# Patient Record
Sex: Male | Born: 1965
Health system: Southern US, Community
[De-identification: ages and names within clinical notes are randomized; demographics above are authoritative.]

## PROBLEM LIST (undated history)

## (undated) DIAGNOSIS — R079 Chest pain, unspecified: Secondary | ICD-10-CM

## (undated) DIAGNOSIS — J342 Deviated nasal septum: Secondary | ICD-10-CM

## (undated) DIAGNOSIS — R0789 Other chest pain: Secondary | ICD-10-CM

## (undated) DIAGNOSIS — I10 Essential (primary) hypertension: Secondary | ICD-10-CM

## (undated) DIAGNOSIS — M2629 Other anomalies of dental arch relationship: Secondary | ICD-10-CM

## (undated) DIAGNOSIS — E291 Testicular hypofunction: Secondary | ICD-10-CM

## (undated) DIAGNOSIS — N1831 Chronic kidney disease, stage 3a: Secondary | ICD-10-CM

## (undated) DIAGNOSIS — G4720 Circadian rhythm sleep disorder, unspecified type: Secondary | ICD-10-CM

## (undated) DIAGNOSIS — R1310 Dysphagia, unspecified: Secondary | ICD-10-CM

## (undated) DIAGNOSIS — N2581 Secondary hyperparathyroidism of renal origin: Secondary | ICD-10-CM

## (undated) DIAGNOSIS — N200 Calculus of kidney: Secondary | ICD-10-CM

## (undated) DIAGNOSIS — K219 Gastro-esophageal reflux disease without esophagitis: Secondary | ICD-10-CM

## (undated) DIAGNOSIS — N400 Enlarged prostate without lower urinary tract symptoms: Secondary | ICD-10-CM

## (undated) DIAGNOSIS — K2970 Gastritis, unspecified, without bleeding: Secondary | ICD-10-CM

## (undated) DIAGNOSIS — J1282 Pneumonia due to coronavirus disease 2019: Secondary | ICD-10-CM

## (undated) DIAGNOSIS — U071 COVID-19: Secondary | ICD-10-CM

## (undated) DIAGNOSIS — E785 Hyperlipidemia, unspecified: Secondary | ICD-10-CM

## (undated) DIAGNOSIS — N529 Male erectile dysfunction, unspecified: Secondary | ICD-10-CM

## (undated) DIAGNOSIS — E559 Vitamin D deficiency, unspecified: Secondary | ICD-10-CM

## (undated) DIAGNOSIS — R202 Paresthesia of skin: Secondary | ICD-10-CM

## (undated) DIAGNOSIS — R0981 Nasal congestion: Secondary | ICD-10-CM

## (undated) DIAGNOSIS — G4730 Sleep apnea, unspecified: Secondary | ICD-10-CM

## (undated) DIAGNOSIS — G4733 Obstructive sleep apnea (adult) (pediatric): Secondary | ICD-10-CM

## (undated) DIAGNOSIS — R109 Unspecified abdominal pain: Secondary | ICD-10-CM

## (undated) DIAGNOSIS — R61 Generalized hyperhidrosis: Secondary | ICD-10-CM

## (undated) HISTORY — DX: Other anomalies of dental arch relationship: M26.29

## (undated) HISTORY — PX: VASECTOMY: SHX75

## (undated) HISTORY — DX: Essential (primary) hypertension: I10

## (undated) HISTORY — DX: Gastritis, unspecified, without bleeding: K29.70

## (undated) HISTORY — DX: Dysphagia, unspecified: R13.10

## (undated) HISTORY — DX: Benign prostatic hyperplasia without lower urinary tract symptoms: N40.0

## (undated) HISTORY — DX: Generalized hyperhidrosis: R61

## (undated) HISTORY — DX: Pneumonia due to coronavirus disease 2019: J12.82

## (undated) HISTORY — DX: Unspecified abdominal pain: R10.9

## (undated) HISTORY — DX: COVID-19: U07.1

## (undated) HISTORY — PX: HERNIA REPAIR: SHX51

## (undated) HISTORY — DX: Gastro-esophageal reflux disease without esophagitis: K21.9

## (undated) HISTORY — DX: Male erectile dysfunction, unspecified: N52.9

## (undated) HISTORY — DX: Paresthesia of skin: R20.2

## (undated) HISTORY — DX: Hyperlipidemia, unspecified: E78.5

## (undated) HISTORY — DX: Deviated nasal septum: J34.2

## (undated) HISTORY — DX: Chronic kidney disease, stage 3a: N18.31

## (undated) HISTORY — DX: Circadian rhythm sleep disorder, unspecified type: G47.20

## (undated) HISTORY — DX: Calculus of kidney: N20.0

## (undated) HISTORY — DX: Sleep apnea, unspecified: G47.30

## (undated) HISTORY — DX: Nasal congestion: R09.81

## (undated) HISTORY — PX: CHOLECYSTECTOMY: SHX55

## (undated) HISTORY — DX: Chest pain, unspecified: R07.9

## (undated) HISTORY — DX: Other chest pain: R07.89

## (undated) HISTORY — DX: Obstructive sleep apnea (adult) (pediatric): G47.33

## (undated) HISTORY — DX: Secondary hyperparathyroidism of renal origin: N25.81

---
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
## 2001-02-12 ENCOUNTER — Encounter: Payer: Self-pay | Admitting: Family Medicine

## 2001-02-12 ENCOUNTER — Ambulatory Visit (HOSPITAL_COMMUNITY): Admission: RE | Admit: 2001-02-12 | Discharge: 2001-02-12 | Payer: Self-pay | Admitting: Family Medicine

## 2001-05-02 ENCOUNTER — Encounter: Payer: Self-pay | Admitting: Internal Medicine

## 2001-05-02 ENCOUNTER — Ambulatory Visit (HOSPITAL_COMMUNITY): Admission: RE | Admit: 2001-05-02 | Discharge: 2001-05-02 | Payer: Self-pay | Admitting: Internal Medicine

## 2001-08-14 ENCOUNTER — Encounter: Payer: Self-pay | Admitting: Family Medicine

## 2001-08-14 ENCOUNTER — Ambulatory Visit (HOSPITAL_COMMUNITY): Admission: RE | Admit: 2001-08-14 | Discharge: 2001-08-14 | Payer: Self-pay | Admitting: Family Medicine

## 2001-11-14 ENCOUNTER — Emergency Department (HOSPITAL_COMMUNITY): Admission: EM | Admit: 2001-11-14 | Discharge: 2001-11-14 | Payer: Self-pay

## 2002-09-03 ENCOUNTER — Encounter (HOSPITAL_COMMUNITY): Admission: RE | Admit: 2002-09-03 | Discharge: 2002-10-03 | Payer: Self-pay | Admitting: Orthopedic Surgery

## 2004-04-12 ENCOUNTER — Ambulatory Visit (HOSPITAL_COMMUNITY): Admission: RE | Admit: 2004-04-12 | Discharge: 2004-04-12 | Payer: Self-pay | Admitting: Family Medicine

## 2004-05-18 ENCOUNTER — Ambulatory Visit (HOSPITAL_COMMUNITY): Admission: RE | Admit: 2004-05-18 | Discharge: 2004-05-18 | Payer: Self-pay | Admitting: Family Medicine

## 2004-10-04 ENCOUNTER — Ambulatory Visit (HOSPITAL_COMMUNITY): Admission: RE | Admit: 2004-10-04 | Discharge: 2004-10-04 | Payer: Self-pay | Admitting: Family Medicine

## 2005-06-08 ENCOUNTER — Ambulatory Visit (HOSPITAL_COMMUNITY): Admission: RE | Admit: 2005-06-08 | Discharge: 2005-06-08 | Payer: Self-pay | Admitting: Family Medicine

## 2006-01-18 ENCOUNTER — Ambulatory Visit (HOSPITAL_COMMUNITY): Admission: RE | Admit: 2006-01-18 | Discharge: 2006-01-18 | Payer: Self-pay | Admitting: Family Medicine

## 2006-03-04 ENCOUNTER — Ambulatory Visit (HOSPITAL_COMMUNITY): Admission: RE | Admit: 2006-03-04 | Discharge: 2006-03-04 | Payer: Self-pay | Admitting: Family Medicine

## 2007-03-14 ENCOUNTER — Ambulatory Visit (HOSPITAL_COMMUNITY): Admission: RE | Admit: 2007-03-14 | Discharge: 2007-03-14 | Payer: Self-pay | Admitting: Family Medicine

## 2007-05-12 ENCOUNTER — Ambulatory Visit (HOSPITAL_COMMUNITY): Admission: RE | Admit: 2007-05-12 | Discharge: 2007-05-12 | Payer: Self-pay | Admitting: Family Medicine

## 2008-07-05 ENCOUNTER — Ambulatory Visit (HOSPITAL_COMMUNITY): Admission: RE | Admit: 2008-07-05 | Discharge: 2008-07-05 | Payer: Self-pay | Admitting: Internal Medicine

## 2009-05-10 ENCOUNTER — Emergency Department (HOSPITAL_COMMUNITY): Admission: EM | Admit: 2009-05-10 | Discharge: 2009-05-11 | Payer: Self-pay | Admitting: Emergency Medicine

## 2009-05-11 ENCOUNTER — Telehealth: Payer: Self-pay | Admitting: Gastroenterology

## 2009-05-12 ENCOUNTER — Encounter: Payer: Self-pay | Admitting: Gastroenterology

## 2009-05-30 ENCOUNTER — Ambulatory Visit: Payer: Self-pay | Admitting: Gastroenterology

## 2009-05-30 ENCOUNTER — Ambulatory Visit (HOSPITAL_COMMUNITY): Admission: RE | Admit: 2009-05-30 | Discharge: 2009-05-30 | Payer: Self-pay | Admitting: Gastroenterology

## 2009-05-30 HISTORY — PX: ESOPHAGOGASTRODUODENOSCOPY: SHX1529

## 2009-05-31 ENCOUNTER — Telehealth (INDEPENDENT_AMBULATORY_CARE_PROVIDER_SITE_OTHER): Payer: Self-pay

## 2009-05-31 ENCOUNTER — Encounter: Payer: Self-pay | Admitting: Gastroenterology

## 2009-06-10 ENCOUNTER — Ambulatory Visit (HOSPITAL_COMMUNITY): Admission: RE | Admit: 2009-06-10 | Discharge: 2009-06-10 | Payer: Self-pay | Admitting: Gastroenterology

## 2009-06-10 ENCOUNTER — Ambulatory Visit: Payer: Self-pay | Admitting: Gastroenterology

## 2009-06-10 HISTORY — PX: ESOPHAGOGASTRODUODENOSCOPY: SHX1529

## 2009-08-29 ENCOUNTER — Ambulatory Visit (HOSPITAL_COMMUNITY): Admission: RE | Admit: 2009-08-29 | Discharge: 2009-08-29 | Payer: Self-pay | Admitting: Internal Medicine

## 2009-10-07 ENCOUNTER — Ambulatory Visit: Payer: Self-pay | Admitting: Gastroenterology

## 2009-10-07 DIAGNOSIS — K219 Gastro-esophageal reflux disease without esophagitis: Secondary | ICD-10-CM | POA: Insufficient documentation

## 2010-04-19 ENCOUNTER — Ambulatory Visit: Payer: Self-pay | Admitting: Gastroenterology

## 2010-04-19 DIAGNOSIS — R1310 Dysphagia, unspecified: Secondary | ICD-10-CM | POA: Insufficient documentation

## 2010-07-24 ENCOUNTER — Telehealth (INDEPENDENT_AMBULATORY_CARE_PROVIDER_SITE_OTHER): Payer: Self-pay | Admitting: *Deleted

## 2010-08-08 NOTE — Assessment & Plan Note (Signed)
Summary: GERD, DYSPHAGIA   Visit Type:  Follow-up Visit Primary Care Provider:  Dwana Melena, M.D.   Chief Complaint:  Genella Rife.  History of Present Illness: Some foods make him bloated. has heartburn randomly. Procedure: rare problems swallowing.  Bms: 0-1/day.  Current Medications (verified): 1)  Nexium 40 Mg Cpdr (Esomeprazole Magnesium) .... As Needed 2)  Vit B Complex .... Take 1 Tablet By Mouth Once A Day 3)  Advil .... As Needed 4)  Tylenol .... As Needed 5)  Vitamin D 3 .... Take 1 Tablet By Mouth Once A Day 6)  Vitamin A .... Take 1 Tablet By Mouth Once A Day 7)  Loratadine 10 Mg Tabs (Loratadine) .... As Needed  Allergies (verified): 1)  ! Penicillin  Past History:  Past Surgical History: Last updated: 10/07/2009 Cholecystectomy Vasectomy Hernia Repair  Past Medical History: GERD Gastritis STRICTURE **EGD/DIL 17 mm-Bx: NL esophagus SHINGLES ON LEFT THIGH 2011  Family History: Reviewed history from 10/07/2009 and no changes required. No FH of Colon Cancer or polyps  Social History: Recently divorced. Wife left him 2 years ago.  Patient has never smoked.  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   Vital Signs:  Patient profile:   45 year old male Height:      73 inches Weight:      204 pounds BMI:     27.01 Temp:     97.9 degrees F oral Pulse rate:   80 / minute BP sitting:   140 / 90  (left arm) Cuff size:   regular  Vitals Entered By: Cloria Spring LPN (April 19, 2010 4:19 PM)  Physical Exam  General:  Well developed, well nourished, no acute distress. Head:  Normocephalic and atraumatic. Mouth:  No deformity or lesions. Lungs:  Clear throughout to auscultation. Heart:  Regular rate and rhythm; no murmurs. Abdomen:  Soft, nontender and  mildly distended. Normal bowel sounds. Extremities:  No edema or deformities noted.  Impression & Recommendations:  Problem # 1:  GERD (ICD-530.81) Assessment  Improved Pt Sx not ideally controlled due to medical nonadherence/social stressors. Encouraged pt to take Nexium daily and explained long term outcome if he fails to do so. Nexium Rx for 6 mos. OPV in 6 mos.  Problem # 2:  DYSPHAGIA (ICD-787.20) Assessment: Improved Intermittent Sx likely 2o to  uncontrolled GERD. Encouraged Nexium.  EGD/dil if needed.  CC: PCP Prescriptions: NEXIUM 40 MG CPDR (ESOMEPRAZOLE MAGNESIUM) 1 by mouth every morning  #30 x 5   Entered and Authorized by:   West Bali MD   Signed by:   West Bali MD on 04/19/2010   Method used:   Electronically to        [facility] (retail)       [address]       [place], [place]  [address]       Ph: [phone]       Fax: [phone]   RxID:   [id number]   Appended Document: Orders Update    Clinical Lists Changes  Orders: Added new Service order of Est. Patient Level II (38756) - Signed

## 2010-08-08 NOTE — Assessment & Plan Note (Signed)
Summary: Dysphagia    Visit Type:  Follow-up Visit Primary Care Provider:  Dwana Melena, M.D.  Chief Complaint:  F/U dysphagia.  History of Present Illness: Not taking pills daily. OTC doesn't work as well as Nexium. Swallowing improved. If eats s/t thick. Feels the swallowing thing again. Feels heartburn when eats roast or chicken and dumplings-1-2x/week.   Preventive Screening-Counseling & Management  Alcohol-Tobacco     Smoking Status: never  Current Medications (verified): 1)  Nexium 40 Mg Cpdr (Esomeprazole Magnesium) .... Take 1 Tablet By Mouth Once A Day 2)  Vitamin C .... Take 1 Tablet By Mouth Once A Day 3)  Vit B Complex .... Take 1 Tablet By Mouth Once A Day 4)  Advil .... As Needed 5)  Tylenol .... As Needed  Allergies (verified): 1)  ! Penicillin  Past History:  Past Medical History: GERD Gastritis SHINGLES ON LEFT THIGH 2011  Past Surgical History: Cholecystectomy Vasectomy Hernia Repair  Family History: No FH of Colon Cancer or polyps  Social History: Recently divorced. Patient has never smoked.  Occupation: [employer] x 16 years, General labor/maintenance Smoking Status:  never  Review of Systems       Per HPI otherwise allsystems negative.  Vital Signs:  Patient profile:   45 year old male Height:      73 inches Weight:      198 pounds BMI:     26.22 Temp:     97.9 degrees F oral Pulse rate:   64 / minute BP sitting:   138 / 80  (left arm) Cuff size:   regular  Vitals Entered By: Cloria Spring LPN (October 07, 4164 4:24 PM)  Physical Exam  General:  Well developed, well nourished, no acute distress. Head:  Normocephalic and atraumatic. Lungs:  Clear throughout to auscultation. Heart:  Regular rate and rhythm; no murmurs. Abdomen:  Soft, nontender and nondistended. Normal bowel sounds. Msk:  Symmetrical with no gross deformities. Normal posture. Extremities:  No edema or deformities noted.  Impression & Recommendations:  Problem #  1:  GERD (ICD-530.81) Assessment Improved  s/p dilation. Still having intermittent dysphagia and reflux. Continue Nexium. OPV in 6 mos. Consider repeat EGD/dil w/i next 6-12 mos if dysphagia persists.  CC: PCP  Orders: Est. Patient Level III (06301)

## 2010-08-08 NOTE — Progress Notes (Signed)
  Pt seen in ED for impacted food bolus. Needs EGD/dil. Instruct pt his meats should be chopped or ground only until he has an endoscopy. Pt declined to have procedure last night. West Bali MD  May 11, 2009 10:42 AM  Appended Document:  [facility] for pt to call back  Appended Document:  I spoke with and gave the above recommendations. He is scheduled for his EGD on 05/30/09!9:30am.

## 2010-08-08 NOTE — Letter (Signed)
Summary: TRIAGE ORDER  TRIAGE ORDER   Imported By: Ave Filter 05/12/2009 15:04:35  _____________________________________________________________________  External Attachment:    Type:   Image     Comment:   External Document  Appended Document: TRIAGE ORDER EGD/dilation next Cl[place] Cao or Fri, Dx: stricture. Neee 45 min slot.  Appended Document: TRIAGE ORDER Pt is scheduled for EGD/ED on 06/10/09@11 :15am  Pt is aware of appt.

## 2010-08-10 NOTE — Progress Notes (Signed)
  Phone Note Call from Patient   Reason for Call: Refill Medication Summary of Call: Pt changed insurance plans at work and is now able to get Rx in 3 month supplies.  He would like a Rx for  4m of Nexium faxed to [phone]  Initial call taken by: Peggyann Shoals,  July 24, 2010 10:22 AM     Appended Document:     Prescriptions: NEXIUM 40 MG CPDR (ESOMEPRAZOLE MAGNESIUM) 1 by mouth every morning  #90 x 0   Entered and Authorized by:   Gerrit Halls NP   Signed by:   Gerrit Halls NP on 07/24/2010   Method used:   Print then Give to Patient   RxID:   [id number]

## 2010-08-14 ENCOUNTER — Other Ambulatory Visit (HOSPITAL_COMMUNITY): Payer: Self-pay | Admitting: Internal Medicine

## 2010-08-14 DIAGNOSIS — I861 Scrotal varices: Secondary | ICD-10-CM

## 2010-08-28 ENCOUNTER — Other Ambulatory Visit (HOSPITAL_COMMUNITY): Payer: Self-pay | Admitting: Internal Medicine

## 2010-08-28 ENCOUNTER — Ambulatory Visit (HOSPITAL_COMMUNITY)
Admission: RE | Admit: 2010-08-28 | Discharge: 2010-08-28 | Disposition: A | Discharge status: Home / Self Care | Payer: 59 | Source: Ambulatory Visit | Attending: Internal Medicine | Admitting: Internal Medicine

## 2010-08-28 DIAGNOSIS — I861 Scrotal varices: Secondary | ICD-10-CM

## 2010-08-28 DIAGNOSIS — Z09 Encounter for follow-up examination after completed treatment for conditions other than malignant neoplasm: Secondary | ICD-10-CM | POA: Insufficient documentation

## 2010-08-28 DIAGNOSIS — N433 Hydrocele, unspecified: Secondary | ICD-10-CM | POA: Insufficient documentation

## 2010-10-11 LAB — LIPASE, BLOOD: Lipase: 24 U/L (ref 11–59)

## 2010-10-11 LAB — COMPREHENSIVE METABOLIC PANEL
ALT: 24 U/L (ref 0–53)
AST: 20 U/L (ref 0–37)
Albumin: 4.2 g/dL (ref 3.5–5.2)
Alkaline Phosphatase: 64 U/L (ref 39–117)
BUN: 16 mg/dL (ref 6–23)
CO2: 28 mEq/L (ref 19–32)
Calcium: 9.1 mg/dL (ref 8.4–10.5)
Chloride: 105 mEq/L (ref 96–112)
Creatinine, Ser: 1.05 mg/dL (ref 0.4–1.5)
GFR calc Af Amer: >60 mL/min (ref >60)
GFR calc non Af Amer: >60 mL/min (ref >60)
Glucose, Bld: 106 mg/dL — ABNORMAL HIGH (ref 70–99)
Potassium: 3.8 mEq/L (ref 3.5–5.1)
Sodium: 138 mEq/L (ref 135–145)
Total Bilirubin: 0.5 mg/dL (ref 0.3–1.2)
Total Protein: 7.2 g/dL (ref 6.0–8.3)

## 2010-10-11 LAB — DIFFERENTIAL
Basophils Absolute: 0 10*3/uL (ref 0.0–0.1)
Basophils Relative: 0 % (ref 0–1)
Eosinophils Absolute: 0.2 10*3/uL (ref 0.0–0.7)
Eosinophils Relative: 3 % (ref 0–5)
Lymphocytes Relative: 24 % (ref 12–46)
Lymphs Abs: 1.9 10*3/uL (ref 0.7–4.0)
Monocytes Absolute: 0.9 10*3/uL (ref 0.1–1.0)
Monocytes Relative: 11 % (ref 3–12)
Neutro Abs: 5 10*3/uL (ref 1.7–7.7)
Neutrophils Relative %: 62 % (ref 43–77)

## 2010-10-11 LAB — CBC
HCT: 42 % (ref 39.0–52.0)
Hemoglobin: 14.5 g/dL (ref 13.0–17.0)
MCHC: 34.6 g/dL (ref 30.0–36.0)
MCV: 86.3 fL (ref 78.0–100.0)
Platelets: 205 10*3/uL (ref 150–400)
RBC: 4.86 MIL/uL (ref 4.22–5.81)
RDW: 13.1 % (ref 11.5–15.5)
WBC: 8 10*3/uL (ref 4.0–10.5)

## 2011-08-13 ENCOUNTER — Other Ambulatory Visit (HOSPITAL_COMMUNITY): Payer: Self-pay | Admitting: Internal Medicine

## 2011-08-13 DIAGNOSIS — I861 Scrotal varices: Secondary | ICD-10-CM

## 2011-10-01 ENCOUNTER — Other Ambulatory Visit (HOSPITAL_COMMUNITY): Payer: Self-pay | Admitting: Internal Medicine

## 2011-10-01 ENCOUNTER — Ambulatory Visit (HOSPITAL_COMMUNITY)
Admission: RE | Admit: 2011-10-01 | Discharge: 2011-10-01 | Disposition: A | Discharge status: Home / Self Care | Payer: 59 | Source: Ambulatory Visit | Attending: Internal Medicine | Admitting: Internal Medicine

## 2011-10-01 DIAGNOSIS — R0602 Shortness of breath: Secondary | ICD-10-CM

## 2011-10-01 DIAGNOSIS — I861 Scrotal varices: Secondary | ICD-10-CM

## 2011-10-01 DIAGNOSIS — N508 Other specified disorders of male genital organs: Secondary | ICD-10-CM | POA: Insufficient documentation

## 2012-05-01 ENCOUNTER — Encounter: Payer: Self-pay | Admitting: Gastroenterology

## 2012-05-05 ENCOUNTER — Encounter: Payer: Self-pay | Admitting: Gastroenterology

## 2012-05-05 ENCOUNTER — Ambulatory Visit (INDEPENDENT_AMBULATORY_CARE_PROVIDER_SITE_OTHER): Payer: 59 | Admitting: Gastroenterology

## 2012-05-05 VITALS — BP 143/73 | HR 82 | Temp 97.8°F | Ht 73.0 in | Wt 201.4 lb

## 2012-05-05 DIAGNOSIS — R1310 Dysphagia, unspecified: Secondary | ICD-10-CM

## 2012-05-05 MED ORDER — ESOMEPRAZOLE MAGNESIUM 40 MG PO CPDR: 40.0000 mg | DELAYED_RELEASE_CAPSULE | Freq: Every day | ORAL | Status: DC

## 2012-05-05 NOTE — Progress Notes (Signed)
Primary Care Physician: Dwana Melena, MD  Primary Gastroenterologist:  Jonette Eva, MD   Chief Complaint  Patient presents with  . Medication Refill  . Follow-up    HPI: James Blankenship is a 46 y.o. male here for f/u visit. Last seen 04/2010. H/O GERD and peptic strictures requiring dialtion.  Trouble swallowing meats and breads. Has had episodes where food becomes lodged and he has to induce vomiting to get it out. Cannot get it to wash down. Sometimes eats too fast. No heartburn. Doesn't take Nexium like supposed to. Takes about 1/2 the time. No abdominal pain, no diarrhea/constipation. No rectal bleeding or melena. States his symptoms are related to stress and being in rush all the time.  Current Outpatient Prescriptions  Medication Sig Dispense Refill  . esomeprazole (NEXIUM) 40 MG capsule Take 40 mg by mouth daily before breakfast.      . loratadine (CLARITIN) 10 MG tablet Take 10 mg by mouth daily.        Allergies as of 05/05/2012 - Review Complete 05/05/2012  Allergen Reaction Noted  . Penicillins     Past Medical History  Diagnosis Date  . GERD (gastroesophageal reflux disease)   . Gastritis    Past Surgical History  Procedure Date  . Cholecystectomy   . Vasectomy   . Hernia repair   . Esophagogastroduodenoscopy 06/10/2009    Distal esophagea stricture dialted to 17mm. Bx from distal esophagus (neg). erythema in body and antrum.  . Esophagogastroduodenoscopy 05/30/2009    distal peptic stricture dialted to 15mm, diffuse bx proven gastritis, no .hpylori   Family History  Problem Relation Age of Onset  . Colon cancer Neg Hx    History   Social History  . Marital Status: Divorced    Spouse Name: N/A    Number of Children: 2  . Years of Education: N/A   Occupational History  .  [employer]   Social History Main Topics  . Smoking status: Never Smoker   . Smokeless tobacco: None  . Alcohol Use: No  . Drug Use: No  . Sexually Active: None   Other  Topics Concern  . None   Social History Narrative  . None     ROS:  General: Negative for anorexia, weight loss, fever, chills, fatigue, weakness. ENT: Negative for hoarseness, difficulty swallowing , nasal congestion. CV: Negative for chest pain, angina, palpitations, dyspnea on exertion, peripheral edema.  Respiratory: Negative for dyspnea at rest, dyspnea on exertion, cough, sputum, wheezing.  GI: See history of present illness. GU:  Negative for dysuria, hematuria, urinary incontinence, urinary frequency, nocturnal urination.  Endo: Negative for unusual weight change.    Physical Examination:   BP 143/73  Pulse 82  Temp 97.8 F (36.6 C) (Temporal)  Ht 6\' 1"  (1.854 m)  Wt 201 lb 6.4 oz (91.354 kg)  BMI 26.57 kg/m2  General: Well-nourished, well-developed in no acute distress.  Eyes: No icterus. Mouth: Oropharyngeal mucosa moist and pink , no lesions erythema or exudate. Lungs: Clear to auscultation bilaterally.  Heart: Regular rate and rhythm, no murmurs rubs or gallops.  Abdomen: Bowel sounds are normal, nontender, nondistended, no hepatosplenomegaly or masses, no abdominal bruits or hernia , no rebound or guarding.   Extremities: No lower extremity edema. No clubbing or deformities. Neuro: Alert and oriented x 4   Skin: Warm and dry, no jaundice.   Psych: Alert and cooperative, normal mood and affect.

## 2012-05-05 NOTE — Assessment & Plan Note (Signed)
H/O peptic stricture requiring dilation twice, the last time in 06/2009. Recurrent symptoms indicate likely recurrent stricture. Patient states he needs a few weeks to prepare for having EGD/ED done and will call us back to schedule.   Advised to take his Nexium every day. He should take his time when eating and chew food thoroughly.

## 2012-05-05 NOTE — Patient Instructions (Addendum)
Please call when you are ready to schedule your upper endoscopy with Dr. Darrick Penna for esophageal dilation. Please take your Nexium daily. Chew your food thoroughly.

## 2012-05-05 NOTE — Progress Notes (Signed)
Faxed to PCP

## 2013-05-12 ENCOUNTER — Emergency Department (HOSPITAL_COMMUNITY): Payer: 59

## 2013-05-12 ENCOUNTER — Encounter (HOSPITAL_COMMUNITY): Payer: Self-pay | Admitting: Emergency Medicine

## 2013-05-12 ENCOUNTER — Emergency Department (HOSPITAL_COMMUNITY)
Admission: EM | Admit: 2013-05-12 | Discharge: 2013-05-12 | Disposition: A | Discharge status: Home / Self Care | Payer: 59 | Attending: Emergency Medicine | Admitting: Emergency Medicine

## 2013-05-12 DIAGNOSIS — R42 Dizziness and giddiness: Secondary | ICD-10-CM | POA: Insufficient documentation

## 2013-05-12 DIAGNOSIS — Z880 Allergy status to penicillin: Secondary | ICD-10-CM | POA: Insufficient documentation

## 2013-05-12 DIAGNOSIS — K219 Gastro-esophageal reflux disease without esophagitis: Secondary | ICD-10-CM | POA: Insufficient documentation

## 2013-05-12 DIAGNOSIS — R112 Nausea with vomiting, unspecified: Secondary | ICD-10-CM | POA: Insufficient documentation

## 2013-05-12 DIAGNOSIS — Z79899 Other long term (current) drug therapy: Secondary | ICD-10-CM | POA: Insufficient documentation

## 2013-05-12 DIAGNOSIS — H538 Other visual disturbances: Secondary | ICD-10-CM | POA: Insufficient documentation

## 2013-05-12 DIAGNOSIS — Z9889 Other specified postprocedural states: Secondary | ICD-10-CM | POA: Insufficient documentation

## 2013-05-12 DIAGNOSIS — Z9089 Acquired absence of other organs: Secondary | ICD-10-CM | POA: Insufficient documentation

## 2013-05-12 LAB — CBC WITH DIFFERENTIAL/PLATELET
Basophils Absolute: 0.1 10*3/uL (ref 0.0–0.1)
Basophils Relative: 1 % (ref 0–1)
Eosinophils Absolute: 0.2 10*3/uL (ref 0.0–0.7)
Eosinophils Relative: 3 % (ref 0–5)
HCT: 41.2 % (ref 39.0–52.0)
Hemoglobin: 14.2 g/dL (ref 13.0–17.0)
Lymphocytes Relative: 39 % (ref 12–46)
Lymphs Abs: 2.6 10*3/uL (ref 0.7–4.0)
MCH: 30.2 pg (ref 26.0–34.0)
MCHC: 34.5 g/dL (ref 30.0–36.0)
MCV: 87.7 fL (ref 78.0–100.0)
Monocytes Absolute: 0.9 10*3/uL (ref 0.1–1.0)
Monocytes Relative: 13 % — ABNORMAL HIGH (ref 3–12)
Neutro Abs: 2.9 10*3/uL (ref 1.7–7.7)
Neutrophils Relative %: 44 % (ref 43–77)
Platelets: 195 10*3/uL (ref 150–400)
RBC: 4.7 MIL/uL (ref 4.22–5.81)
RDW: 13.4 % (ref 11.5–15.5)
WBC: 6.6 10*3/uL (ref 4.0–10.5)

## 2013-05-12 LAB — COMPREHENSIVE METABOLIC PANEL
ALT: 42 U/L (ref 0–53)
AST: 23 U/L (ref 0–37)
Albumin: 3.9 g/dL (ref 3.5–5.2)
Alkaline Phosphatase: 72 U/L (ref 39–117)
BUN: 14 mg/dL (ref 6–23)
CO2: 25 mEq/L (ref 19–32)
Calcium: 9.1 mg/dL (ref 8.4–10.5)
Chloride: 103 mEq/L (ref 96–112)
Creatinine, Ser: 1.01 mg/dL (ref 0.50–1.35)
GFR calc Af Amer: >90 mL/min (ref >90)
GFR calc non Af Amer: 87 mL/min — ABNORMAL LOW (ref >90)
Glucose, Bld: 135 mg/dL — ABNORMAL HIGH (ref 70–99)
Potassium: 3.6 mEq/L (ref 3.5–5.1)
Sodium: 140 mEq/L (ref 135–145)
Total Bilirubin: 0.3 mg/dL (ref 0.3–1.2)
Total Protein: 7.2 g/dL (ref 6.0–8.3)

## 2013-05-12 LAB — TROPONIN I: Troponin I: <0.3 ng/mL (ref <0.30)

## 2013-05-12 LAB — LIPASE, BLOOD: Lipase: 29 U/L (ref 11–59)

## 2013-05-12 MED ORDER — MECLIZINE HCL 12.5 MG PO TABS
25.0000 mg | ORAL_TABLET | Freq: Once | ORAL | Status: AC
  Administered 2013-05-12: 25 mg via ORAL
  Filled 2013-05-12: qty 2

## 2013-05-12 MED ORDER — ONDANSETRON HCL 4 MG PO TABS: 4.0000 mg | ORAL_TABLET | Freq: Four times a day (QID) | ORAL | Status: DC

## 2013-05-12 MED ORDER — ONDANSETRON HCL 4 MG/2ML IJ SOLN
4.0000 mg | Freq: Once | INTRAMUSCULAR | Status: AC
  Administered 2013-05-12: 4 mg via INTRAVENOUS
  Filled 2013-05-12: qty 2

## 2013-05-12 MED ORDER — DIAZEPAM 5 MG PO TABS: 5.0000 mg | ORAL_TABLET | Freq: Two times a day (BID) | ORAL | Status: DC

## 2013-05-12 MED ORDER — LORAZEPAM 2 MG/ML IJ SOLN
1.0000 mg | Freq: Once | INTRAMUSCULAR | Status: AC
  Administered 2013-05-12: 1 mg via INTRAVENOUS
  Filled 2013-05-12: qty 1

## 2013-05-12 MED ORDER — GADOBENATE DIMEGLUMINE 529 MG/ML IV SOLN
20.0000 mL | Freq: Once | INTRAVENOUS | Status: AC | PRN
Start: 2013-05-12 — End: 2013-05-12
  Administered 2013-05-12: 20 mL via INTRAVENOUS

## 2013-05-12 MED ORDER — SODIUM CHLORIDE 0.9 % IV BOLUS (SEPSIS)
1000.0000 mL | Freq: Once | INTRAVENOUS | Status: AC
  Administered 2013-05-12: 1000 mL via INTRAVENOUS

## 2013-05-12 MED ORDER — MECLIZINE HCL 12.5 MG PO TABS: 12.5000 mg | ORAL_TABLET | Freq: Three times a day (TID) | ORAL | Status: DC | PRN

## 2013-05-12 NOTE — ED Notes (Signed)
Pt states that he woke up with dizziness this am, feels as if he is swaying when standing, vomiting X1 on arrival to er, dry heaves at home, dizziness is worse with position changes and when he has his eyes closed. Dr. Manus Gunning at bedside upon arrival to er, denies any pain but does states that his stomach feels "uneasy", had two episodes of diarrhea this am,

## 2013-05-12 NOTE — ED Notes (Signed)
Pt remains in MRI 

## 2013-05-12 NOTE — ED Notes (Signed)
Woke up with severe dizziness. Nausea at home. Vomited x 1 in waiting area. Diarrhea also began this morning.

## 2013-05-12 NOTE — ED Provider Notes (Signed)
CSN: [id number]     Arrival date & time 05/12/13  9147 History   This chart was scribed for Glynn Octave, MD, by Yevette Edwards, ED Scribe. This patient was seen in room APA19/APA19 and the patient's care was started at 7:04 AM. First MD Initiated Contact with Patient 05/12/13 0703     Chief Complaint  Patient presents with  . Dizziness  . Emesis    The history is provided by the patient and the spouse. No language interpreter was used.   HPI Comments: James Blankenship is a 47 y.o. male who presents to the Emergency Department complaining of persistent dizziness which began upon awakening two hours ago. He reports the "room has been spinning constantly," but he reports the dizziness has gradually improved. He also reports that laying still alleviates the dizziness, but movement, standing,  and having his eyes open increase the dizziness.The pt has also experienced nausea, one episode of emesis, two episodes of diarrhea, and mildly blurred vision which has since resolved.   He denies a headache, chest pain, SOB, hematochezia, or weakness or numbness to his extremities. The pt ate dinner normally yesterday evening. He had the flu vaccine two weeks ago. The pt denies any recent travels. He has GERD, and he has taken the prescribed Nexium today. He denies a h/o HTN or cardiac issues. The pt reports that he has been informed by his doctor that his LDL are too high. He is a non-smoker.   Dr. Catalina Pizza Past Medical History  Diagnosis Date  . GERD (gastroesophageal reflux disease)   . Gastritis    Past Surgical History  Procedure Laterality Date  . Cholecystectomy    . Vasectomy    . Hernia repair    . Esophagogastroduodenoscopy  06/10/2009    Distal esophagea stricture dialted to 17mm. Bx from distal esophagus (neg). erythema in body and antrum.  . Esophagogastroduodenoscopy  05/30/2009    distal peptic stricture dialted to 15mm, diffuse bx proven gastritis, no .hpylori   Family History   Problem Relation Age of Onset  . Colon cancer Neg Hx    History  Substance Use Topics  . Smoking status: Never Smoker   . Smokeless tobacco: Not on file  . Alcohol Use: No    Review of Systems  Constitutional: Negative for fever.  Eyes: Positive for visual disturbance (Mildly blurred, since resolved. ).  Respiratory: Negative for shortness of breath.   Cardiovascular: Negative for chest pain.  Gastrointestinal: Positive for nausea and vomiting. Negative for abdominal pain and blood in stool.  Neurological: Positive for dizziness. Negative for weakness, numbness and headaches.  All other systems reviewed and are negative.    Allergies  Penicillins  Home Medications   Current Outpatient Rx  Name  Route  Sig  Dispense  Refill  . acetaminophen (TYLENOL) 500 MG tablet   Oral   Take 1,000 mg by mouth every 6 (six) hours as needed (pain).         Marland Kitchen esomeprazole (NEXIUM) 40 MG capsule   Oral   Take 1 capsule (40 mg total) by mouth daily before breakfast.   90 capsule   3   . ibuprofen (ADVIL,MOTRIN) 200 MG tablet   Oral   Take 600 mg by mouth every 6 (six) hours as needed for headache.         . diazepam (VALIUM) 5 MG tablet   Oral   Take 1 tablet (5 mg total) by mouth 2 (two) times daily.  10 tablet   0   . meclizine (ANTIVERT) 12.5 MG tablet   Oral   Take 1 tablet (12.5 mg total) by mouth 3 (three) times daily as needed for dizziness.   30 tablet   0   . ondansetron (ZOFRAN) 4 MG tablet   Oral   Take 1 tablet (4 mg total) by mouth every 6 (six) hours.   12 tablet   0    Triage Vitals: BP 141/78  Pulse 94  Temp(Src) 97.8 F (36.6 C) (Oral)  Resp 23  Ht 6\' 1"  (1.854 m)  Wt 208 lb (94.348 kg)  BMI 27.45 kg/m2  SpO2 100%  Physical Exam  Nursing note and vitals reviewed. Constitutional: He is oriented to person, place, and time. He appears well-developed and well-nourished. No distress.  HENT:  Head: Normocephalic and atraumatic.  Mouth/Throat:  Oropharynx is clear and moist. No oropharyngeal exudate.  Eyes: Conjunctivae and EOM are normal. Pupils are equal, round, and reactive to light.  No nystagmus.   Neck: Neck supple. No tracheal deviation present.  Cardiovascular: Normal rate and regular rhythm.   No murmur heard. Pulmonary/Chest: Effort normal and breath sounds normal. No respiratory distress. He has no wheezes. He has no rales.  Abdominal: Soft. There is no tenderness.  Musculoskeletal: Normal range of motion. He exhibits no edema and no tenderness.  Neurological: He is alert and oriented to person, place, and time. No cranial nerve deficit. He exhibits normal muscle tone. Coordination normal.  Test of skew negative Head impulse negative.  CN 2-12 intact, no ataxia on finger to nose, no nystagmus, 5/5 strength throughout, no pronator drift, Romberg negative, normal gait.   Skin: Skin is warm and dry.  Psychiatric: He has a normal mood and affect. His behavior is normal.    ED Course  Procedures (including critical care time)  DIAGNOSTIC STUDIES: Oxygen Saturation is 100% on room air, normal by my interpretation.    COORDINATION OF CARE:  7:15 AM- Discussed treatment plan with patient, and the patient agreed to the plan.   11:58 AM- Rechecked pt. Pt feeling better. Informed the pt to follow up with a ENT specialist.   Labs Review Labs Reviewed  CBC WITH DIFFERENTIAL - Abnormal; Notable for the following:    Monocytes Relative 13 (*)    All other components within normal limits  COMPREHENSIVE METABOLIC PANEL - Abnormal; Notable for the following:    Glucose, Bld 135 (*)    GFR calc non Af Amer 87 (*)    All other components within normal limits  LIPASE, BLOOD  TROPONIN I   Imaging Review Ct Head Wo Contrast  05/12/2013   CLINICAL DATA:  Persistent dizziness  EXAM: CT HEAD WITHOUT CONTRAST  TECHNIQUE: Contiguous axial images were obtained from the base of the skull through the vertex without intravenous  contrast.  COMPARISON:  None.  FINDINGS: Bony calvarium is intact. No gross soft tissue abnormality is noted. The ventricles are normal in size and configuration. No findings to suggest acute hemorrhage, acute infarction or space-occupying mass lesion are noted.  IMPRESSION: No acute abnormality seen.   Electronically Signed   By: Alcide Clever M.D.   On: 05/12/2013 07:42    EKG Interpretation     Ventricular Rate:  71 PR Interval:  126 QRS Duration: 88 QT Interval:  368 QTC Calculation: 399 R Axis:   59 Text Interpretation:  Normal sinus rhythm Normal ECG When compared with ECG of 10-May-2009 21:57, No significant change was  found No significant change was found            MDM   1. Vertigo    2 hours of vertigo with nausea, vomiting.  No focal weakness, numbness, tingling, headache, chest pain or SOB.  EKG nsr. Troponin negative. CT head negative.  Suspect peripheral vertigo given positional nature, sudden onset.  No ataxia, no nystagmus, head impulse testing negative and test of skew negative. Patient to continues to complain of dizziness .  WIll check MRI. MRI and MRA negative for stroke or for VBI. Patient tolerating PO and able to tolerate PO.  Will discharge with meclizine and valium.  I personally performed the services described in this documentation, which was scribed in my presence. The recorded information has been reviewed and is accurate.    Glynn Octave, MD 05/12/13 1736

## 2013-05-12 NOTE — ED Notes (Signed)
Pt c.o dizziness with standing performing orthostatics but does state that the dizziness is better than before, c/o dizziness with ambulation. Dr. Manus Gunning notified,

## 2013-05-12 NOTE — ED Notes (Signed)
Pt assisted to restroom, tolerated well, states that the dizziness is still there but better than it was before.

## 2014-01-05 ENCOUNTER — Ambulatory Visit (HOSPITAL_BASED_OUTPATIENT_CLINIC_OR_DEPARTMENT_OTHER): Payer: 59 | Attending: Otolaryngology | Admitting: Radiology

## 2014-01-05 VITALS — Ht 74.0 in | Wt 212.0 lb

## 2014-01-05 DIAGNOSIS — G4710 Hypersomnia, unspecified: Secondary | ICD-10-CM | POA: Insufficient documentation

## 2014-01-05 DIAGNOSIS — G4730 Sleep apnea, unspecified: Principal | ICD-10-CM

## 2014-01-05 DIAGNOSIS — G4733 Obstructive sleep apnea (adult) (pediatric): Secondary | ICD-10-CM

## 2014-01-09 DIAGNOSIS — G4733 Obstructive sleep apnea (adult) (pediatric): Secondary | ICD-10-CM

## 2014-01-09 NOTE — Sleep Study (Signed)
   NAME: James Blankenship DATE OF BIRTH:  Aug 20, 1965 MEDICAL RECORD NUMBER [id number]  LOCATION: [facility]  PHYSICIAN: Analiya Porco D  DATE OF STUDY: 01/05/2014  SLEEP STUDY TYPE: Nocturnal Polysomnogram               REFERRING PHYSICIAN: Melida Quitter, MD  INDICATION FOR STUDY: Hypersomnia with sleep apnea  EPWORTH SLEEPINESS SCORE:   14/24 HEIGHT: 6\' 2"  (188 cm)  WEIGHT: 212 lb (96.163 kg)    Body mass index is 27.21 kg/(m^2).  NECK SIZE: 16 in.  MEDICATIONS: Charted for review  SLEEP ARCHITECTURE: Total sleep time 286 minutes with sleep efficiency 76.8%. Stage I was 25.2%, stage II 56.1%, stage III absent, REM 18.7% of total sleep time. Sleep latency 19 minutes, REM latency 75 minutes, awake after sleep onset 58 minutes, arousal index 41.5, bedtime medication: None. Frequent brief awakenings were noted throughout the study.  RESPIRATORY DATA: Apnea hypopnea index (AHI) 3.6 per hour. 17 total events scored including 6 obstructive apneas, 2 central apneas, 1 mixed apnea, 8 hypopneas. Events were seen all sleep positions. REM AHI 0  OXYGEN DATA: Moderate to loud snoring with oxygen desaturation to a nadir of 90% and a mean saturation through the study of 95.2% on room air.  CARDIAC DATA: Sinus rhythm with PACs  MOVEMENT/PARASOMNIA: 57 limb jerks were counted of which 18 were associated with arousals or awakenings for a periodic limb movement with arousal index of 3.8 per hour. Bathroom x1. The technician noted that the patient was frequently coughing throughout the study, associated with arousals.  IMPRESSION/ RECOMMENDATION:   1) Occasional respiratory event with sleep disturbance, within normal limits. AHI 3.6 per hour (the normal range for adults is an AHI from 0-5 events per hour). No significant position effect. Moderate to loud snoring with oxygen desaturation to a nadir of 90% and a mean saturation through the study of 95.2% on room air.  2) Not enough  respiratory events were recorded to qualify for split protocol CPAP titration. 3) Frequent coughing was noted throughout the night, associated with frequent arousals.  Signed Baird Lyons M.D. Deneise Lever Diplomate, American Board of Sleep Medicine  ELECTRONICALLY SIGNED ON:  01/09/2014, 12:18 PM [place] PH: [phone]   FX: [phone] [place]

## 2014-03-24 ENCOUNTER — Telehealth (HOSPITAL_COMMUNITY): Payer: Self-pay

## 2014-03-24 NOTE — Telephone Encounter (Signed)
Said his dr told him he didn't need therapy right now

## 2014-03-26 ENCOUNTER — Ambulatory Visit (HOSPITAL_COMMUNITY): Payer: 59 | Admitting: Physical Therapy

## 2014-08-05 ENCOUNTER — Ambulatory Visit (HOSPITAL_COMMUNITY)
Admission: RE | Admit: 2014-08-05 | Discharge: 2014-08-05 | Disposition: A | Discharge status: Home / Self Care | Payer: 59 | Source: Ambulatory Visit | Attending: Internal Medicine | Admitting: Internal Medicine

## 2014-08-05 ENCOUNTER — Other Ambulatory Visit (HOSPITAL_COMMUNITY): Payer: Self-pay | Admitting: Internal Medicine

## 2014-08-05 DIAGNOSIS — R0602 Shortness of breath: Secondary | ICD-10-CM | POA: Diagnosis not present

## 2014-08-05 DIAGNOSIS — J949 Pleural condition, unspecified: Secondary | ICD-10-CM

## 2014-08-05 DIAGNOSIS — R0981 Nasal congestion: Secondary | ICD-10-CM | POA: Diagnosis not present

## 2015-09-28 ENCOUNTER — Telehealth: Payer: Self-pay | Admitting: Gastroenterology

## 2015-09-28 ENCOUNTER — Ambulatory Visit (INDEPENDENT_AMBULATORY_CARE_PROVIDER_SITE_OTHER): Payer: Commercial Managed Care - HMO | Admitting: Gastroenterology

## 2015-09-28 ENCOUNTER — Telehealth: Payer: Self-pay

## 2015-09-28 ENCOUNTER — Other Ambulatory Visit: Payer: Self-pay

## 2015-09-28 ENCOUNTER — Encounter: Payer: Self-pay | Admitting: Gastroenterology

## 2015-09-28 VITALS — BP 142/79 | HR 85 | Temp 98.4°F | Ht 72.0 in | Wt 217.0 lb

## 2015-09-28 DIAGNOSIS — R1310 Dysphagia, unspecified: Secondary | ICD-10-CM | POA: Diagnosis not present

## 2015-09-28 DIAGNOSIS — R109 Unspecified abdominal pain: Secondary | ICD-10-CM | POA: Diagnosis not present

## 2015-09-28 DIAGNOSIS — Z0001 Encounter for general adult medical examination with abnormal findings: Secondary | ICD-10-CM | POA: Insufficient documentation

## 2015-09-28 DIAGNOSIS — Z1211 Encounter for screening for malignant neoplasm of colon: Secondary | ICD-10-CM | POA: Diagnosis not present

## 2015-09-28 MED ORDER — PEG-KCL-NACL-NASULF-NA ASC-C 100 G PO SOLR: 1.0000 | Freq: Once | ORAL | Status: AC

## 2015-09-28 NOTE — Assessment & Plan Note (Signed)
No prior colonoscopy. No concerning lower GI symptoms.   Proceed with colonoscopy with Dr. Oneida Alar in the near future. The risks, benefits, and alternatives have been discussed in detail with the patient. They state understanding and desire to proceed.  PROPOFOL for sedation

## 2015-09-28 NOTE — Telephone Encounter (Signed)
Per [facility] online No PA needed for CT scan. Pt is set for CT on 10/05/2015 @ 0915. Called and LMOM with appt.  Per [facility] online TCS/EGD approved PA # is [id number]

## 2015-09-28 NOTE — Assessment & Plan Note (Signed)
Vague, diffuse abdominal pain. Query umbilical/ventral hernia possibly. RUQ pain, gallbladder absent. Will order CBC, CMP, lipase, PSA (per patient request) and proceed with CT. Patient's exam with distended abdomen but soft. Possible hepatomegaly.

## 2015-09-28 NOTE — Assessment & Plan Note (Signed)
50 year old male with history of peptic stricture requiring several dilations in the past, last in 2010. Now with recurrent dysphagia. Likely dealing with recurrent stricture. He is not on a PPI currently. Will add Protonix once daily and arrange EGD in the near future. He requests to be "knocked out" as he is concerned about sedation.  Proceed with upper endoscopy/dilation in the near future with Dr. Oneida Alar. The risks, benefits, and alternatives have been discussed in detail with patient. They have stated understanding and desire to proceed.  PROPOFOL Protonix once daily sent to pharmacy

## 2015-09-28 NOTE — Patient Instructions (Signed)
Please have blood work done today.  We have also ordered a CT scan.  You have been scheduled for a colonoscopy, upper endoscopy and dilation with Dr. Oneida Alar.

## 2015-09-28 NOTE — Progress Notes (Signed)
Referring Provider: Celene Squibb, MD Primary Care Physician:  Wende Neighbors, MD Primary GI: Dr. Oneida Alar    Chief Complaint  Patient presents with  . Dysphagia    HPI:   James Blankenship is a 50 y.o. male presenting today with a history of GERD and peptic strictures requiring dilation. Last performed in 2010.    Presenting with intermittent dysphagia. States he will eat and be fine, then another time eat and have to get up and let it pass on its own. Sometimes when eating will have RUQ discomfort (gallbladder absent). Has knot in his belly button, thinks it may be a hernia. If lays down and tenses up, has his abdomen that knots up. Has had a pain in right lower back for the past 2 weeks. When moving/bending, feels weird. No recent blood work. Not on a PPI. Used to take as needed but not taking anymore. Completely out.   No prior colonoscopy. No constipation, diarrhea, rectal bleeding.  States he wants to be knocked out. Concerned about being awake.    Past Medical History  Diagnosis Date  . GERD (gastroesophageal reflux disease)   . Gastritis     Past Surgical History  Procedure Laterality Date  . Cholecystectomy    . Vasectomy    . Hernia repair      groin  . Esophagogastroduodenoscopy  06/10/2009    Distal esophagea stricture dialted to 59mm. Bx from distal esophagus (neg). erythema in body and antrum.  . Esophagogastroduodenoscopy  05/30/2009    distal peptic stricture dialted to 24mm, diffuse bx proven gastritis, no .hpylori    Current Outpatient Prescriptions  Medication Sig Dispense Refill  . acetaminophen (TYLENOL) 500 MG tablet Take 1,000 mg by mouth every 6 (six) hours as needed (pain). Reported on 09/28/2015    . ibuprofen (ADVIL,MOTRIN) 200 MG tablet Take 600 mg by mouth every 6 (six) hours as needed for headache. Reported on 09/28/2015     No current facility-administered medications for this visit.    Allergies as of 09/28/2015 - Review Complete 09/28/2015    Allergen Reaction Noted  . Penicillins Swelling     Family History  Problem Relation Age of Onset  . Colon cancer Neg Hx   . Cancer - Other Father     mesothelioma, deceased at age 59     Social History   Social History  . Marital Status: Divorced    Spouse Name: N/A  . Number of Children: 2  . Years of Education: N/A   Occupational History  .  [employer]   Social History Main Topics  . Smoking status: Never Smoker   . Smokeless tobacco: None  . Alcohol Use: No  . Drug Use: No  . Sexual Activity: Not Asked   Other Topics Concern  . None   Social History Narrative    Review of Systems: Gen: Denies fever, chills, anorexia. Denies fatigue, weakness, weight loss.  CV: Denies chest pain, palpitations, syncope, peripheral edema, and claudication. Resp: Denies dyspnea at rest, cough, wheezing, coughing up blood, and pleurisy. GI: see HPI  Derm: Denies rash, itching, dry skin Psych: Denies depression, anxiety, memory loss, confusion. No homicidal or suicidal ideation.  Heme: Denies bruising, bleeding, and enlarged lymph nodes.  Physical Exam: BP 142/79 mmHg  Pulse 85  Temp(Src) 98.4 F (36.9 C) (Oral)  Ht 6' (1.829 m)  Wt 217 lb (98.431 kg)  BMI 29.42 kg/m2 General:   Alert and oriented. No distress noted. Pleasant  and cooperative.  Head:  Normocephalic and atraumatic. Eyes:  Conjuctiva clear without scleral icterus. Mouth:  Oral mucosa pink and moist. Good dentition. No lesions. Heart:  S1, S2 present without murmurs, rubs, or gallops. Regular rate and rhythm. Abdomen:  +BS, distended but soft. Rectus diastasis quite significant. Umbilical hernia. Possible liver margin felt several fingerbreadths below right costal margin. Larger AP diameter difficult to appreciate.  Msk:  Symmetrical without gross deformities. Normal posture. Extremities:  Without edema. Neurologic:  Alert and  oriented x4;  grossly normal neurologically. Psych:  Alert and cooperative.  Normal mood and affect.

## 2015-09-28 NOTE — Telephone Encounter (Signed)
I completely forgot to start him on a PPI. He is not on one. I have sent in Protonix to his pharmacy. Please let him know he needs to take this every day, 30 minutes before breakfast. Thanks!

## 2015-09-29 LAB — COMPLETE METABOLIC PANEL WITH GFR
ALT: 40 U/L (ref 9–46)
AST: 23 U/L (ref 10–35)
Albumin: 4.2 g/dL (ref 3.6–5.1)
Alkaline Phosphatase: 60 U/L (ref 40–115)
BUN: 19 mg/dL (ref 7–25)
CO2: 25 mmol/L (ref 20–31)
Calcium: 9 mg/dL (ref 8.6–10.3)
Chloride: 103 mmol/L (ref 98–110)
Creat: 1.18 mg/dL (ref 0.70–1.33)
GFR, Est African American: 83 mL/min (ref >=60)
GFR, Est Non African American: 72 mL/min (ref >=60)
Glucose, Bld: 97 mg/dL (ref 65–99)
Potassium: 4.3 mmol/L (ref 3.5–5.3)
Sodium: 136 mmol/L (ref 135–146)
Total Bilirubin: 0.3 mg/dL (ref 0.2–1.2)
Total Protein: 6.8 g/dL (ref 6.1–8.1)

## 2015-09-29 LAB — CBC
HCT: 39.8 % (ref 39.0–52.0)
Hemoglobin: 13.6 g/dL (ref 13.0–17.0)
MCH: 28.8 pg (ref 26.0–34.0)
MCHC: 34.2 g/dL (ref 30.0–36.0)
MCV: 84.3 fL (ref 78.0–100.0)
MPV: 10 fL (ref 8.6–12.4)
Platelets: 224 10*3/uL (ref 150–400)
RBC: 4.72 MIL/uL (ref 4.22–5.81)
RDW: 14.3 % (ref 11.5–15.5)
WBC: 6.6 10*3/uL (ref 4.0–10.5)

## 2015-09-29 LAB — LIPASE: Lipase: 25 U/L (ref 7–60)

## 2015-09-29 LAB — PSA: PSA: 0.52 ng/mL (ref <=4.00)

## 2015-09-29 NOTE — Telephone Encounter (Signed)
LMOM to call.

## 2015-09-29 NOTE — Progress Notes (Signed)
CC'ED TO PCP 

## 2015-09-29 NOTE — Telephone Encounter (Signed)
Pt returned call and is aware.  

## 2015-10-03 ENCOUNTER — Ambulatory Visit (HOSPITAL_COMMUNITY)
Admission: RE | Admit: 2015-10-03 | Discharge: 2015-10-03 | Disposition: A | Discharge status: Home / Self Care | Payer: Commercial Managed Care - HMO | Source: Ambulatory Visit | Attending: Gastroenterology | Admitting: Gastroenterology

## 2015-10-03 DIAGNOSIS — R109 Unspecified abdominal pain: Secondary | ICD-10-CM | POA: Diagnosis present

## 2015-10-03 DIAGNOSIS — Z1211 Encounter for screening for malignant neoplasm of colon: Secondary | ICD-10-CM | POA: Diagnosis not present

## 2015-10-03 DIAGNOSIS — N289 Disorder of kidney and ureter, unspecified: Secondary | ICD-10-CM | POA: Diagnosis not present

## 2015-10-03 DIAGNOSIS — K429 Umbilical hernia without obstruction or gangrene: Secondary | ICD-10-CM | POA: Insufficient documentation

## 2015-10-03 MED ORDER — IOHEXOL 300 MG/ML  SOLN
100.0000 mL | Freq: Once | INTRAMUSCULAR | Status: AC | PRN
  Administered 2015-10-03: 100 mL via INTRAVENOUS

## 2015-10-04 NOTE — Progress Notes (Signed)
Quick Note:  CBC, lipase, CMP, PSA all NORMAL! This is great. Awaiting CT scan. ______

## 2015-10-04 NOTE — Progress Notes (Signed)
Quick Note:  CT scan without any acute findings. He does have a tiny umbilical hernia containing only fat. There are a few very small likely cysts in kidneys bilaterally. I would send the CT to his PCP for further evaluation of this. Continue with current plan.    ______

## 2015-10-05 ENCOUNTER — Other Ambulatory Visit (HOSPITAL_COMMUNITY): Payer: Commercial Managed Care - HMO

## 2015-10-05 MED ORDER — PANTOPRAZOLE SODIUM 40 MG PO TBEC: 40.0000 mg | DELAYED_RELEASE_TABLET | Freq: Every day | ORAL | Status: DC

## 2015-10-05 NOTE — Telephone Encounter (Signed)
Completed. Sorry about that!

## 2015-10-05 NOTE — Progress Notes (Signed)
Quick Note:  Pt is aware. Please forward CT report to PCP. ______

## 2015-10-05 NOTE — Telephone Encounter (Signed)
Pt said pharmacy did not have his prescription. Could you resend?

## 2015-10-05 NOTE — Addendum Note (Signed)
Addended by: Orvil Feil on: 10/05/2015 04:29 PM   Modules accepted: Orders

## 2015-10-26 NOTE — Patient Instructions (Signed)
James Blankenship  10/26/2015     @PREFPERIOPPHARMACY @   Your procedure is scheduled on  11/01/2015   Report to [facility] at  85  A.M.  Call this number if you have problems the morning of surgery:  [phone]   Remember:  Do not eat food or drink liquids after midnight.  Take these medicines the morning of surgery with A SIP OF WATER  protonix.   Do not wear jewelry, make-up or nail polish.  Do not wear lotions, powders, or perfumes.  You may wear deodorant.  Do not shave 48 hours prior to surgery.  Men may shave face and neck.  Do not bring valuables to the hospital.  [facility] is not responsible for any belongings or valuables.  Contacts, dentures or bridgework may not be worn into surgery.  Leave your suitcase in the car.  After surgery it may be brought to your room.  For patients admitted to the hospital, discharge time will be determined by your treatment team.  Patients discharged the day of surgery will not be allowed to drive home.   Name and phone number of your driver:   family Special instructions:  Follow the diet and prep instructions given to you by Dr Nona Dell office.  Please read over the following fact sheets that you were given. Coughing and Deep Breathing, Surgical Site Infection Prevention, Anesthesia Post-op Instructions and Care and Recovery After Surgery      Esophagogastroduodenoscopy Esophagogastroduodenoscopy (EGD) is a procedure that is used to examine the lining of the esophagus, stomach, and first part of the small intestine (duodenum). A long, flexible, lighted tube with a camera attached (endoscope) is inserted down the throat to view these organs. This procedure is done to detect problems or abnormalities, such as inflammation, bleeding, ulcers, or growths, in order to treat them. The procedure lasts 5-20 minutes. It is usually an outpatient procedure, but it may need to be performed in a hospital in emergency  cases. LET [facility] CARE PROVIDER KNOW ABOUT:  Any allergies you have.  All medicines you are taking, including vitamins, herbs, eye drops, creams, and over-the-counter medicines.  Previous problems you or members of your family have had with the use of anesthetics.  Any blood disorders you have.  Previous surgeries you have had.  Medical conditions you have. RISKS AND COMPLICATIONS Generally, this is a safe procedure. However, problems can occur and include:  Infection.  Bleeding.  Tearing (perforation) of the esophagus, stomach, or duodenum.  Difficulty breathing or not being able to breathe.  Excessive sweating.  Spasms of the larynx.  Slowed heartbeat.  Low blood pressure. BEFORE THE PROCEDURE  Do not eat or drink anything after midnight on the night before the procedure or as directed by your health care provider.  Do not take your regular medicines before the procedure if your health care provider asks you not to. Ask your health care provider about changing or stopping those medicines.  If you wear dentures, be prepared to remove them before the procedure.  Arrange for someone to drive you home after the procedure. PROCEDURE  A numbing medicine (local anesthetic) may be sprayed in your throat for comfort and to stop you from gagging or coughing.  You will have an IV tube inserted in a vein in your hand or arm. You will receive medicines and fluids through this tube.  You will be given a  medicine to relax you (sedative).  A pain reliever will be given through the IV tube.  A mouth guard may be placed in your mouth to protect your teeth and to keep you from biting on the endoscope.  You will be asked to lie on your left side.  The endoscope will be inserted down your throat and into your esophagus, stomach, and duodenum.  Air will be put through the endoscope to allow your health care provider to clearly view the lining of your esophagus.  The lining  of your esophagus, stomach, and duodenum will be examined. During the exam, your health care provider may:  Remove tissue to be examined under a microscope (biopsy) for inflammation, infection, or other medical problems.  Remove growths.  Remove objects (foreign bodies) that are stuck.  Treat any bleeding with medicines or other devices that stop tissues from bleeding (hot cautery, clipping devices).  Widen (dilate) or stretch narrowed areas of your esophagus and stomach.  The endoscope will be withdrawn. AFTER THE PROCEDURE  You will be taken to a recovery area for observation. Your blood pressure, heart rate, breathing rate, and blood oxygen level will be monitored often until the medicines you were given have worn off.  Do not eat or drink anything until the numbing medicine has worn off and your gag reflex has returned. You may choke.  Your health care provider should be able to discuss his or her findings with you. It will take longer to discuss the test results if any biopsies were taken.   This information is not intended to replace advice given to you by your health care provider. Make sure you discuss any questions you have with your health care provider.   Document Released: 10/26/2004 Document Revised: 07/16/2014 Document Reviewed: 05/28/2012 Elsevier Interactive Patient Education 2016 [employer]. Esophagogastroduodenoscopy, Care After Refer to this sheet in the next few weeks. These instructions provide you with information about caring for yourself after your procedure. Your health care provider may also give you more specific instructions. Your treatment has been planned according to current medical practices, but problems sometimes occur. Call your health care provider if you have any problems or questions after your procedure. WHAT TO EXPECT AFTER THE PROCEDURE After your procedure, it is typical to feel:  Soreness in your throat.  Pain with swallowing.  Sick to  your stomach (nauseous).  Bloated.  Dizzy.  Fatigued. HOME CARE INSTRUCTIONS  Do not eat or drink anything until the numbing medicine (local anesthetic) has worn off and your gag reflex has returned. You will know that the local anesthetic has worn off when you can swallow comfortably.  Do not drive or operate machinery until directed by your health care provider.  Take medicines only as directed by your health care provider. SEEK MEDICAL CARE IF:   You cannot stop coughing.  You are not urinating at all or less than usual. SEEK IMMEDIATE MEDICAL CARE IF:  You have difficulty swallowing.  You cannot eat or drink.  You have worsening throat or chest pain.  You have dizziness or lightheadedness or you faint.  You have nausea or vomiting.  You have chills.  You have a fever.  You have severe abdominal pain.  You have black, tarry, or bloody stools.   This information is not intended to replace advice given to you by your health care provider. Make sure you discuss any questions you have with your health care provider.   Document Released: 06/11/2012 Document Revised:  07/16/2014 Document Reviewed: 06/11/2012 Elsevier Interactive Patient Education 2016 Else[employer] Inc. Esophageal Dilatation Esophageal dilatation is a procedure to open a blocked or narrowed part of the esophagus. The esophagus is the long tube in your throat that carries food and liquid from your mouth to your stomach. The procedure is also called esophageal dilation.  You may need this procedure if you have a buildup of scar tissue in your esophagus that makes it difficult, painful, or even impossible to swallow. This can be caused by gastroesophageal reflux disease (GERD). In rare cases, people need this procedure because they have cancer of the esophagus or a problem with the way food moves through the esophagus. Sometimes you may need to have another dilatation to enlarge the opening of the esophagus  gradually. LET [employer] CARE PROVIDER KNOW ABOUT:   Any allergies you have.  All medicines you are taking, including vitamins, herbs, eye drops, creams, and over-the-counter medicines.  Previous problems you or members of your family have had with the use of anesthetics.  Any blood disorders you have.  Previous surgeries you have had.  Medical conditions you have.  Any antibiotic medicines you are required to take before dental procedures. RISKS AND COMPLICATIONS Generally, this is a safe procedure. However, problems can occur and include:  Bleeding from a tear in the lining of the esophagus.  A hole (perforation) in the esophagus. BEFORE THE PROCEDURE  Do not eat or drink anything after midnight on the night before the procedure or as directed by your health care provider.  Ask your health care provider about changing or stopping your regular medicines. This is especially important if you are taking diabetes medicines or blood thinners.  Plan to have someone take you home after the procedure. PROCEDURE   You will be given a medicine that makes you relaxed and sleepy (sedative).  A medicine may be sprayed or gargled to numb the back of the throat.  Your health care provider can use various instruments to do an esophageal dilatation. During the procedure, the instrument used will be placed in your mouth and passed down into your esophagus. Options include:  Simple dilators. This instrument is carefully placed in the esophagus to stretch it.  Guided wire bougies. In this method, a flexible tube (endoscope) is used to insert a wire into the esophagus. The dilator is passed over this wire to enlarge the esophagus. Then the wire is removed.  Balloon dilators. An endoscope with a small balloon at the end is passed down into the esophagus. Inflating the balloon gently stretches the esophagus and opens it up. AFTER THE PROCEDURE  Your blood pressure, heart rate, breathing rate,  and blood oxygen level will be monitored often until the medicines you were given have worn off.  Your throat may feel slightly sore and will probably still feel numb. This will improve slowly over time.  You will not be allowed to eat or drink until the throat numbness has resolved.  If this is a same-day procedure, you may be allowed to go home once you have been able to drink, urinate, and sit on the edge of the bed without nausea or dizziness.  If this is a same-day procedure, you should have a friend or family member with you for the next 24 hours after the procedure.   This information is not intended to replace advice given to you by your health care provider. Make sure you discuss any questions you have with your health care provider.  Document Released: 08/16/2005 Document Revised: 07/16/2014 Document Reviewed: 11/04/2013 Elsevier Interactive Patient Education 2016 [employer]. Colonoscopy A colonoscopy is an exam to look at the entire large intestine (colon). This exam can help find problems such as tumors, polyps, inflammation, and areas of bleeding. The exam takes about 1 hour.  LET [facility] CARE PROVIDER KNOW ABOUT:   Any allergies you have.  All medicines you are taking, including vitamins, herbs, eye drops, creams, and over-the-counter medicines.  Previous problems you or members of your family have had with the use of anesthetics.  Any blood disorders you have.  Previous surgeries you have had.  Medical conditions you have. RISKS AND COMPLICATIONS  Generally, this is a safe procedure. However, as with any procedure, complications can occur. Possible complications include:  Bleeding.  Tearing or rupture of the colon wall.  Reaction to medicines given during the exam.  Infection (rare). BEFORE THE PROCEDURE   Ask your health care provider about changing or stopping your regular medicines.  You may be prescribed an oral bowel prep. This involves drinking  a large amount of medicated liquid, starting the day before your procedure. The liquid will cause you to have multiple loose stools until your stool is almost clear or light green. This cleans out your colon in preparation for the procedure.  Do not eat or drink anything else once you have started the bowel prep, unless your health care provider tells you it is safe to do so.  Arrange for someone to drive you home after the procedure. PROCEDURE   You will be given medicine to help you relax (sedative).  You will lie on your side with your knees bent.  A long, flexible tube with a light and camera on the end (colonoscope) will be inserted through the rectum and into the colon. The camera sends video back to a computer screen as it moves through the colon. The colonoscope also releases carbon dioxide gas to inflate the colon. This helps your health care provider see the area better.  During the exam, your health care provider may take a small tissue sample (biopsy) to be examined under a microscope if any abnormalities are found.  The exam is finished when the entire colon has been viewed. AFTER THE PROCEDURE   Do not drive for 24 hours after the exam.  You may have a small amount of blood in your stool.  You may pass moderate amounts of gas and have mild abdominal cramping or bloating. This is caused by the gas used to inflate your colon during the exam.  Ask when your test results will be ready and how you will get your results. Make sure you get your test results.   This information is not intended to replace advice given to you by your health care provider. Make sure you discuss any questions you have with your health care provider.   Document Released: 06/22/2000 Document Revised: 04/15/2013 Document Reviewed: 03/02/2013 Elsevier Interactive Patient Education 2016 [employer] Inc. Colonoscopy, Care After Refer to this sheet in the next few weeks. These instructions provide you with  information on caring for yourself after your procedure. Your health care provider may also give you more specific instructions. Your treatment has been planned according to current medical practices, but problems sometimes occur. Call your health care provider if you have any problems or questions after your procedure. WHAT TO EXPECT AFTER THE PROCEDURE  After your procedure, it is typical to have the following:  A small  amount of blood in your stool.  Moderate amounts of gas and mild abdominal cramping or bloating. HOME CARE INSTRUCTIONS  Do not drive, operate machinery, or sign important documents for 24 hours.  You may shower and resume your regular physical activities, but move at a slower pace for the first 24 hours.  Take frequent rest periods for the first 24 hours.  Walk around or put a warm pack on your abdomen to help reduce abdominal cramping and bloating.  Drink enough fluids to keep your urine clear or pale yellow.  You may resume your normal diet as instructed by your health care provider. Avoid heavy or fried foods that are hard to digest.  Avoid drinking alcohol for 24 hours or as instructed by your health care provider.  Only take over-the-counter or prescription medicines as directed by your health care provider.  If a tissue sample (biopsy) was taken during your procedure:  Do not take aspirin or blood thinners for 7 days, or as instructed by your health care provider.  Do not drink alcohol for 7 days, or as instructed by your health care provider.  Eat soft foods for the first 24 hours. SEEK MEDICAL CARE IF: You have persistent spotting of blood in your stool 2-3 days after the procedure. SEEK IMMEDIATE MEDICAL CARE IF:  You have more than a small spotting of blood in your stool.  You pass large blood clots in your stool.  Your abdomen is swollen (distended).  You have nausea or vomiting.  You have a fever.  You have increasing abdominal pain that is  not relieved with medicine.   This information is not intended to replace advice given to you by your health care provider. Make sure you discuss any questions you have with your health care provider.   Document Released: 02/07/2004 Document Revised: 04/15/2013 Document Reviewed: 03/02/2013 Elsevier Interactive Patient Education 2016 Elsevier Inc. PATIENT INSTRUCTIONS POST-ANESTHESIA  IMMEDIATELY FOLLOWING SURGERY:  Do not drive or operate machinery for the first twenty four hours after surgery.  Do not make any important decisions for twenty four hours after surgery or while taking narcotic pain medications or sedatives.  If you develop intractable nausea and vomiting or a severe headache please notify your doctor immediately.  FOLLOW-UP:  Please make an appointment with your surgeon as instructed. You do not need to follow up with anesthesia unless specifically instructed to do so.  WOUND CARE INSTRUCTIONS (if applicable):  Keep a dry clean dressing on the anesthesia/puncture wound site if there is drainage.  Once the wound has quit draining you may leave it open to air.  Generally you should leave the bandage intact for twenty four hours unless there is drainage.  If the epidural site drains for more than 36-48 hours please call the anesthesia department.  QUESTIONS?:  Please feel free to call your physician or the hospital operator if you have any questions, and they will be happy to assist you.

## 2015-10-27 ENCOUNTER — Encounter (HOSPITAL_COMMUNITY): Payer: Self-pay

## 2015-10-27 ENCOUNTER — Encounter (HOSPITAL_COMMUNITY)
Admission: RE | Admit: 2015-10-27 | Discharge: 2015-10-27 | Disposition: A | Discharge status: Home / Self Care | Payer: Commercial Managed Care - HMO | Source: Ambulatory Visit | Attending: Gastroenterology | Admitting: Gastroenterology

## 2015-10-27 ENCOUNTER — Other Ambulatory Visit: Payer: Self-pay

## 2015-10-27 DIAGNOSIS — Z01810 Encounter for preprocedural cardiovascular examination: Secondary | ICD-10-CM | POA: Diagnosis not present

## 2015-10-27 NOTE — Progress Notes (Signed)
   10/27/15 1043  OBSTRUCTIVE SLEEP APNEA  Have you ever been diagnosed with sleep apnea through a sleep study? No  Do you snore loudly (loud enough to be heard through closed doors)?  1  Do you often feel tired, fatigued, or sleepy during the daytime (such as falling asleep during driving or talking to someone)? 1  Has anyone observed you stop breathing during your sleep? 1  Do you have, or are you being treated for high blood pressure? 0  BMI more than 35 kg/m2? 0  Age > 50 (1-yes) 0  Neck circumference greater than:Male 16 inches or larger, Male 17inches or larger? 0  Male Gender (Yes=1) 1  Obstructive Sleep Apnea Score 4  Score 5 or greater  Results sent to PCP

## 2015-10-27 NOTE — Pre-Procedure Instructions (Signed)
Patient given information to sign up for my chart at home. 

## 2015-11-01 ENCOUNTER — Encounter (HOSPITAL_COMMUNITY): Payer: Self-pay | Admitting: *Deleted

## 2015-11-01 ENCOUNTER — Ambulatory Visit (HOSPITAL_COMMUNITY): Payer: Commercial Managed Care - HMO | Admitting: Anesthesiology

## 2015-11-01 ENCOUNTER — Ambulatory Visit (HOSPITAL_COMMUNITY)
Admission: RE | Admit: 2015-11-01 | Discharge: 2015-11-01 | Disposition: A | Discharge status: Home / Self Care | Payer: Commercial Managed Care - HMO | Source: Ambulatory Visit | Attending: Gastroenterology | Admitting: Gastroenterology

## 2015-11-01 ENCOUNTER — Encounter (HOSPITAL_COMMUNITY)
Admission: RE | Disposition: A | Discharge status: Home / Self Care | Payer: Self-pay | Source: Ambulatory Visit | Attending: Gastroenterology

## 2015-11-01 DIAGNOSIS — K219 Gastro-esophageal reflux disease without esophagitis: Secondary | ICD-10-CM | POA: Diagnosis not present

## 2015-11-01 DIAGNOSIS — K2970 Gastritis, unspecified, without bleeding: Secondary | ICD-10-CM

## 2015-11-01 DIAGNOSIS — R1013 Epigastric pain: Secondary | ICD-10-CM | POA: Insufficient documentation

## 2015-11-01 DIAGNOSIS — K2950 Unspecified chronic gastritis without bleeding: Secondary | ICD-10-CM | POA: Diagnosis not present

## 2015-11-01 DIAGNOSIS — K222 Esophageal obstruction: Secondary | ICD-10-CM

## 2015-11-01 DIAGNOSIS — Z801 Family history of malignant neoplasm of trachea, bronchus and lung: Secondary | ICD-10-CM | POA: Diagnosis not present

## 2015-11-01 DIAGNOSIS — R1310 Dysphagia, unspecified: Secondary | ICD-10-CM | POA: Insufficient documentation

## 2015-11-01 DIAGNOSIS — K449 Diaphragmatic hernia without obstruction or gangrene: Secondary | ICD-10-CM | POA: Diagnosis not present

## 2015-11-01 DIAGNOSIS — K648 Other hemorrhoids: Secondary | ICD-10-CM | POA: Insufficient documentation

## 2015-11-01 DIAGNOSIS — Z1211 Encounter for screening for malignant neoplasm of colon: Secondary | ICD-10-CM

## 2015-11-01 DIAGNOSIS — Z79899 Other long term (current) drug therapy: Secondary | ICD-10-CM | POA: Diagnosis not present

## 2015-11-01 HISTORY — PX: COLONOSCOPY WITH PROPOFOL: SHX5780

## 2015-11-01 HISTORY — PX: ESOPHAGOGASTRODUODENOSCOPY (EGD) WITH PROPOFOL: SHX5813

## 2015-11-01 HISTORY — PX: BIOPSY: SHX5522

## 2015-11-01 HISTORY — PX: SAVORY DILATION: SHX5439

## 2015-11-01 SURGERY — ESOPHAGOGASTRODUODENOSCOPY (EGD) WITH PROPOFOL
Anesthesia: Monitor Anesthesia Care

## 2015-11-01 MED ORDER — MIDAZOLAM HCL 2 MG/2ML IJ SOLN
INTRAMUSCULAR | Status: AC
  Filled 2015-11-01: qty 2

## 2015-11-01 MED ORDER — LACTATED RINGERS IV SOLN: INTRAVENOUS | Status: DC

## 2015-11-01 MED ORDER — LIDOCAINE VISCOUS 2 % MT SOLN
15.0000 mL | Freq: Once | OROMUCOSAL | Status: AC
  Administered 2015-11-01: 15 mL via OROMUCOSAL

## 2015-11-01 MED ORDER — MINERAL OIL PO OIL
TOPICAL_OIL | ORAL | Status: AC
  Filled 2015-11-01: qty 30

## 2015-11-01 MED ORDER — LACTATED RINGERS IV SOLN
INTRAVENOUS | Status: DC
  Administered 2015-11-01: 11:00:00 via INTRAVENOUS

## 2015-11-01 MED ORDER — FENTANYL CITRATE (PF) 100 MCG/2ML IJ SOLN
25.0000 ug | INTRAMUSCULAR | Status: DC | PRN
Start: 2015-11-01 — End: 2015-11-01

## 2015-11-01 MED ORDER — PROPOFOL 10 MG/ML IV BOLUS
INTRAVENOUS | Status: AC
  Filled 2015-11-01: qty 20

## 2015-11-01 MED ORDER — ONDANSETRON HCL 4 MG/2ML IJ SOLN: 4.0000 mg | Freq: Once | INTRAMUSCULAR | Status: DC | PRN

## 2015-11-01 MED ORDER — PROPOFOL 500 MG/50ML IV EMUL
INTRAVENOUS | Status: DC | PRN
  Administered 2015-11-01: 12:00:00 via INTRAVENOUS
  Administered 2015-11-01: 100 ug/kg/min via INTRAVENOUS

## 2015-11-01 MED ORDER — MIDAZOLAM HCL 5 MG/5ML IJ SOLN
INTRAMUSCULAR | Status: DC | PRN
  Administered 2015-11-01: 2 mg via INTRAVENOUS

## 2015-11-01 MED ORDER — MIDAZOLAM HCL 2 MG/2ML IJ SOLN: 1.0000 mg | INTRAMUSCULAR | Status: DC | PRN

## 2015-11-01 MED ORDER — LIDOCAINE VISCOUS 2 % MT SOLN
OROMUCOSAL | Status: AC
  Filled 2015-11-01: qty 15

## 2015-11-01 MED ORDER — FENTANYL CITRATE (PF) 100 MCG/2ML IJ SOLN: 25.0000 ug | INTRAMUSCULAR | Status: DC | PRN

## 2015-11-01 MED ORDER — MIDAZOLAM HCL 2 MG/2ML IJ SOLN
1.0000 mg | INTRAMUSCULAR | Status: DC | PRN
  Administered 2015-11-01 (×2): 2 mg via INTRAVENOUS
  Filled 2015-11-01: qty 2

## 2015-11-01 MED ORDER — EPHEDRINE SULFATE 50 MG/ML IJ SOLN
INTRAMUSCULAR | Status: DC | PRN
  Administered 2015-11-01: 10 mg via INTRAVENOUS

## 2015-11-01 NOTE — H&P (Signed)
  Primary Care Physician:  Wende Neighbors, MD Primary Gastroenterologist:  Dr. Oneida Alar  Pre-Procedure History & Physical: HPI:  James Blankenship is a 50 y.o. male here for DYSPHAGIA/DYSPEPSIA/screening.  Past Medical History  Diagnosis Date  . GERD (gastroesophageal reflux disease)   . Gastritis     Past Surgical History  Procedure Laterality Date  . Cholecystectomy    . Vasectomy    . Esophagogastroduodenoscopy  06/10/2009    Distal esophagea stricture dialted to 30mm. Bx from distal esophagus (neg). erythema in body and antrum.  . Esophagogastroduodenoscopy  05/30/2009    distal peptic stricture dialted to 21mm, diffuse bx proven gastritis, no .hpylori  . Hernia repair Left     groin    Prior to Admission medications   Medication Sig Start Date End Date Taking? Authorizing Provider  acetaminophen (TYLENOL) 500 MG tablet Take 1,000 mg by mouth every 6 (six) hours as needed (pain). Reported on 09/28/2015   Yes Historical Provider, MD  ibuprofen (ADVIL,MOTRIN) 200 MG tablet Take 600 mg by mouth every 6 (six) hours as needed for headache. Reported on 09/28/2015   Yes Historical Provider, MD  pantoprazole (PROTONIX) 40 MG tablet Take 1 tablet (40 mg total) by mouth daily. 10/05/15  Yes Orvil Feil, NP    Allergies as of 09/28/2015 - Review Complete 09/28/2015  Allergen Reaction Noted  . Penicillins Swelling     Family History  Problem Relation Age of Onset  . Colon cancer Neg Hx   . Cancer - Other Father     mesothelioma, deceased at age 54     Social History   Social History  . Marital Status: Divorced    Spouse Name: N/A  . Number of Children: 2  . Years of Education: N/A   Occupational History  .  [employer]   Social History Main Topics  . Smoking status: Never Smoker   . Smokeless tobacco: Not on file  . Alcohol Use: No  . Drug Use: No  . Sexual Activity: Yes    Birth Control/ Protection: Surgical   Other Topics Concern  . Not on file   Social History  Narrative    Review of Systems: See HPI, otherwise negative ROS   Physical Exam: There were no vitals taken for this visit. General:   Alert,  pleasant and cooperative in NAD Head:  Normocephalic and atraumatic. Neck:  Supple; Lungs:  Clear throughout to auscultation.    Heart:  Regular rate and rhythm. Abdomen:  Soft, nontender and nondistended. Normal bowel sounds, without guarding, and without rebound.   Neurologic:  Alert and  oriented x4;  grossly normal neurologically.  Impression/Plan:     DYSPHAGIA/DYSPEPSIA/screening  PLAN:  EGD/DIL/tcs TODAY

## 2015-11-01 NOTE — Op Note (Signed)
[facility] Patient Name: James Blankenship Procedure Date: 11/01/2015 11:16 AM MRN: [id number] Date of Birth: 12/25/1965 Attending MD: Barney Drain , MD CSN: [id number] Age: 50 Admit Type: Outpatient Procedure:                Colonoscopy Indications:              Screening for colorectal malignant neoplasm Providers:                Barney Drain, MD, Gwenlyn Fudge, RN, Randa Spike,                            Technician Referring MD:             Delphina Cahill, MD Medicines:                Propofol per Anesthesia Complications:            No immediate complications. Estimated Blood Loss:     Estimated blood loss: none. Procedure:                Pre-Anesthesia Assessment:                           - Prior to the procedure, a History and Physical                            was performed, and patient medications and                            allergies were reviewed. The patient's tolerance of                            previous anesthesia was also reviewed. The risks                            and benefits of the procedure and the sedation                            options and risks were discussed with the patient.                            All questions were answered, and informed consent                            was obtained. Prior Anticoagulants: The patient has                            taken ibuprofen, last dose was day of procedure.                            ASA Grade Assessment: II - A patient with mild                            systemic disease. After reviewing the risks and  benefits, the patient was deemed in satisfactory                            condition to undergo the procedure.                           After obtaining informed consent, the colonoscope                            was passed under direct vision. Throughout the                            procedure, the patient's blood pressure, pulse, and                            oxygen  saturations were monitored continuously. The                            EC-[id number] scope was introduced through                            the anus and advanced to the the cecum, identified                            by appendiceal orifice and ileocecal valve. The                            ileocecal valve, appendiceal orifice, and rectum                            were photographed. The colonoscopy was performed                            with ease. The patient tolerated the procedure                            well. The quality of the bowel preparation was                            excellent. Scope In: 11:38:49 AM Scope Out: 11:52:46 AM Scope Withdrawal Time: 0 hours 11 minutes 28 seconds  Total Procedure Duration: 0 hours 13 minutes 57 seconds  Findings:      The digital rectal exam was normal.      The colon (entire examined portion) appeared normal.      Non-bleeding internal hemorrhoids were found. The hemorrhoids were       moderate. Impression:               - The entire examined colon is normal.                           - Non-bleeding internal hemorrhoids.                           - No specimens collected. Moderate Sedation:  Per Anesthesia Care Recommendation:           - Patient has a contact number available for                            emergencies. The signs and symptoms of potential                            delayed complications were discussed with the                            patient. Return to normal activities tomorrow.                            Written discharge instructions were provided to the                            patient.                           CONTINUE PROTONIX once daily 30 MINUTES PRIOR TO                            BREAKFAST.                           CONTINUE YOUR WEIGHT LOSS EFFORTS. LOSE 20 POUNDS.                           FOLLOW A HIGH FIBER/LOW FAT DIET. AVOID ITEMS THAT                            CAUSE BLOATING. SEE INFO  BELOW.                           CALL IN ONE MONTH IF SWALLOWING IS NOT IMPROVED.                           Follow up in [place] 2017.                           Next colonoscopy in 10 years.                           - High fiber diet and low fat diet.                           - Continue present medications.                           - Repeat colonoscopy in 10 years for surveillance.                           - Return to GI office in 3 months. Procedure Code(s):        --- Professional ---  [id number], Colorectal cancer screening; colonoscopy on                            individual not meeting criteria for high risk Diagnosis Code(s):        --- Professional ---                           Z12.11, Encounter for screening for malignant                            neoplasm of colon                           K64.8, Other hemorrhoids CPT copyright 2016 [employer]. All rights reserved. The codes documented in this report are preliminary and upon coder review may  be revised to meet current compliance requirements. Barney Drain, MD Barney Drain, MD 11/01/2015 8:58:17 PM This report has been signed electronically. Number of Addenda: 0

## 2015-11-01 NOTE — Transfer of Care (Signed)
Immediate Anesthesia Transfer of Care Note  Patient: Rosalie E Ilic  Procedure(s) Performed: Procedure(s) with comments: ESOPHAGOGASTRODUODENOSCOPY (EGD) WITH PROPOFOL (N/A) - 1100 COLONOSCOPY WITH PROPOFOL (N/A) SAVORY DILATION (N/A) BIOPSY - gastric  Patient Location: PACU  Anesthesia Type:MAC  Level of Consciousness: awake and patient cooperative  Airway & Oxygen Therapy: Patient Spontanous Breathing and Patient connected to face mask oxygen  Post-op Assessment: Report given to RN, Post -op Vital signs reviewed and stable and Patient moving all extremities  Post vital signs: Reviewed and stable  Last Vitals:  Filed Vitals:   11/01/15 1110 11/01/15 1115  BP:  118/92  Pulse:    Temp:    Resp: 15 14    Complications: No apparent anesthesia complications

## 2015-11-01 NOTE — Anesthesia Postprocedure Evaluation (Signed)
Anesthesia Post Note  Patient: Eladio E Oshima  Procedure(s) Performed: Procedure(s) (LRB): ESOPHAGOGASTRODUODENOSCOPY (EGD) WITH PROPOFOL (N/A) COLONOSCOPY WITH PROPOFOL (N/A) SAVORY DILATION (N/A) BIOPSY  Patient location during evaluation: PACU Anesthesia Type: MAC Level of consciousness: awake and patient cooperative Pain management: pain level controlled Vital Signs Assessment: post-procedure vital signs reviewed and stable Respiratory status: respiratory function stable, spontaneous breathing and nonlabored ventilation Cardiovascular status: blood pressure returned to baseline Postop Assessment: no signs of nausea or vomiting Anesthetic complications: no    Last Vitals:  Filed Vitals:   11/01/15 1110 11/01/15 1115  BP:  118/92  Pulse:    Temp:    Resp: 15 14    Last Pain: There were no vitals filed for this visit.               [place] Monds J

## 2015-11-01 NOTE — Anesthesia Preprocedure Evaluation (Signed)
Anesthesia Evaluation  Patient identified by MRN, date of birth, ID band Patient awake    Reviewed: Allergy & Precautions, NPO status , Patient's Chart, lab work & pertinent test results  Airway Mallampati: IV  TM Distance: <3 FB     Dental  (+) Teeth Intact, Dental Advisory Given   Pulmonary    Pulmonary exam normal        Cardiovascular Normal cardiovascular exam     Neuro/Psych    GI/Hepatic GERD  Medicated and Poorly Controlled,  Endo/Other    Renal/GU      Musculoskeletal   Abdominal Normal abdominal exam  (+)   Peds  Hematology   Anesthesia Other Findings   Reproductive/Obstetrics                             Anesthesia Physical Anesthesia Plan  ASA: II  Anesthesia Plan: MAC   Post-op Pain Management:    Induction: Intravenous  Airway Management Planned: Nasal Cannula  Additional Equipment:   Intra-op Plan:   Post-operative Plan:   Informed Consent: I have reviewed the patients History and Physical, chart, labs and discussed the procedure including the risks, benefits and alternatives for the proposed anesthesia with the patient or authorized representative who has indicated his/her understanding and acceptance.   Dental advisory given  Plan Discussed with: CRNA  Anesthesia Plan Comments:         Anesthesia Quick Evaluation

## 2015-11-01 NOTE — Discharge Instructions (Signed)
YOU DID NOT HAVE ANY POLYPS. YOU HAVE GASTRITIS. I STRETCHED YOUR ESOPHAGUS DUE AN ESOPHAGEAL STRICTURE. YOU HAVE A SMALL HIATAL HERNIA. I BIOPSIED YOUR STOMACH. YOUR PROBLEMS SWALLOWING ARE MOST LIKELY DUE TO REFLUX, THE STRICTURE, AND THE HIATAL HERNIA.  YOU HAVE INTERNAL HEMORRHOIDS.   CONTINUE PROTONIX once daily 30 MINUTES PRIOR TO BREAKFAST.  CONTINUE YOUR WEIGHT LOSS EFFORTS. LOSE 20 POUNDS.  FOLLOW A HIGH FIBER/LOW FAT DIET. AVOID ITEMS THAT CAUSE BLOATING. SEE INFO BELOW.  YOUR BIOPSY RESULTS WILL BE AVAILABLE IN MY CHART APR 27 AND MY OFFICE WILL CONTACT YOU IN 10-14 DAYS WITH YOUR RESULTS.   PLEASE CALL IN ONE MONTH IF YOUR SWALLOWING IS NOT IMPROVED.   Follow up in [place] 2017.  Next colonoscopy in 10 years.    ENDOSCOPY Care After Read the instructions outlined below and refer to this sheet in the next week. These discharge instructions provide you with general information on caring for yourself after you leave the hospital. While your treatment has been planned according to the most current medical practices available, unavoidable complications occasionally occur. If you have any problems or questions after discharge, call DR. Aziz Slape, [phone].  ACTIVITY  You may resume your regular activity, but move at a slower pace for the next 24 hours.   Take frequent rest periods for the next 24 hours.   Walking will help get rid of the air and reduce the bloated feeling in your belly (abdomen).   No driving for 24 hours (because of the medicine (anesthesia) used during the test).   You may shower.   Do not sign any important legal documents or operate any machinery for 24 hours (because of the anesthesia used during the test).    NUTRITION  Drink plenty of fluids.   You may resume your normal diet as instructed by your doctor.   Begin with a light meal and progress to your normal diet. Heavy or fried foods are harder to digest and may make you feel sick to your  stomach (nauseated).   Avoid alcoholic beverages for 24 hours or as instructed.    MEDICATIONS  You may resume your normal medications.   WHAT YOU CAN EXPECT TODAY  Some feelings of bloating in the abdomen.   Passage of more gas than usual.   Spotting of blood in your stool or on the toilet paper  .  IF YOU HAD POLYPS REMOVED DURING THE ENDOSCOPY:  Eat a soft diet IF YOU HAVE NAUSEA, BLOATING, ABDOMINAL PAIN, OR VOMITING.    FINDING OUT THE RESULTS OF YOUR TEST Not all test results are available during your visit. DR. Oneida Alar WILL CALL YOU WITHIN 14 DAYS OF YOUR PROCEDUE WITH YOUR RESULTS. Do not assume everything is normal if you have not heard from DR. Zaylynn Rickett, CALL HER OFFICE AT [phone].  SEEK IMMEDIATE MEDICAL ATTENTION AND CALL THE OFFICE: [phone] IF:  You have more than a spotting of blood in your stool.   Your belly is swollen (abdominal distention).   You are nauseated or vomiting.   You have a temperature over 101F.   You have abdominal pain or discomfort that is severe or gets worse throughout the day.   Low-Fat Diet BREADS, CEREALS, PASTA, RICE, DRIED PEAS, AND BEANS These products are high in carbohydrates and most are low in fat. Therefore, they can be increased in the diet as substitutes for fatty foods. They too, however, contain calories and should not be eaten in excess. Cereals can be eaten  for snacks as well as for breakfast.   FRUITS AND VEGETABLES It is good to eat fruits and vegetables. Besides being sources of fiber, both are rich in vitamins and some minerals. They help you get the daily allowances of these nutrients. Fruits and vegetables can be used for snacks and desserts.  MEATS Limit lean meat, chicken, [place], and fish to no more than 6 ounces per day. Beef, Pork, and Lamb Use lean cuts of beef, pork, and lamb. Lean cuts include:  Extra-lean ground beef.  Arm roast.  Sirloin tip.  Center-cut ham.  Round steak.  Loin chops.    Rump roast.  Tenderloin.  Trim all fat off the outside of meats before cooking. It is not necessary to severely decrease the intake of red meat, but lean choices should be made. Lean meat is rich in protein and contains a highly absorbable form of iron. Premenopausal women, in particular, should avoid reducing lean red meat because this could increase the risk for low red blood cells (iron-deficiency anemia).  Chicken and [place] These are good sources of protein. The fat of poultry can be reduced by removing the skin and underlying fat layers before cooking. Chicken and [place] can be substituted for lean red meat in the diet. Poultry should not be fried or covered with high-fat sauces. Fish and Shellfish Fish is a good source of protein. Shellfish contain cholesterol, but they usually are low in saturated fatty acids. The preparation of fish is important. Like chicken and [place], they should not be fried or covered with high-fat sauces. EGGS Egg whites contain no fat or cholesterol. They can be eaten often. Try 1 to 2 egg whites instead of whole eggs in recipes or use egg substitutes that do not contain yolk. MILK AND DAIRY PRODUCTS Use skim or 1% milk instead of 2% or whole milk. Decrease whole milk, natural, and processed cheeses. Use nonfat or low-fat (2%) cottage cheese or low-fat cheeses made from vegetable oils. Choose nonfat or low-fat (1 to 2%) yogurt. Experiment with evaporated skim milk in recipes that call for heavy cream. Substitute low-fat yogurt or low-fat cottage cheese for sour cream in dips and salad dressings. Have at least 2 servings of low-fat dairy products, such as 2 glasses of skim (or 1%) milk each day to help get your daily calcium intake. FATS AND OILS Reduce the total intake of fats, especially saturated fat. Butterfat, lard, and beef fats are high in saturated fat and cholesterol. These should be avoided as much as possible. Vegetable fats do not contain cholesterol, but  certain vegetable fats, such as coconut oil, palm oil, and palm kernel oil are very high in saturated fats. These should be limited. These fats are often used in bakery goods, processed foods, popcorn, oils, and nondairy creamers. Vegetable shortenings and some peanut butters contain hydrogenated oils, which are also saturated fats. Read the labels on these foods and check for saturated vegetable oils. Unsaturated vegetable oils and fats do not raise blood cholesterol. However, they should be limited because they are fats and are high in calories. Total fat should still be limited to 30% of your daily caloric intake. Desirable liquid vegetable oils are corn oil, cottonseed oil, olive oil, canola oil, safflower oil, soybean oil, and sunflower oil. Peanut oil is not as good, but small amounts are acceptable. Buy a heart-healthy tub margarine that has no partially hydrogenated oils in the ingredients. Mayonnaise and salad dressings often are made from unsaturated fats, but they should  also be limited because of their high calorie and fat content. Seeds, nuts, peanut butter, olives, and avocados are high in fat, but the fat is mainly the unsaturated type. These foods should be limited mainly to avoid excess calories and fat. OTHER EATING TIPS Snacks  Most sweets should be limited as snacks. They tend to be rich in calories and fats, and their caloric content outweighs their nutritional value. Some good choices in snacks are graham crackers, melba toast, soda crackers, bagels (no egg), English muffins, fruits, and vegetables. These snacks are preferable to snack crackers, [place] fries, TORTILLA CHIPS, and POTATO chips. Popcorn should be air-popped or cooked in small amounts of liquid vegetable oil. Desserts Eat fruit, low-fat yogurt, and fruit ices instead of pastries, cake, and cookies. Sherbet, angel food cake, gelatin dessert, frozen low-fat yogurt, or other frozen products that do not contain saturated fat  (pure fruit juice bars, frozen ice pops) are also acceptable.  COOKING METHODS Choose those methods that use [place] or no fat. They include: Poaching.  Braising.  Steaming.  Grilling.  Baking.  Stir-frying.  Broiling.  Microwaving.  Foods can be cooked in a nonstick pan without added fat, or use a nonfat cooking spray in regular cookware. Limit fried foods and avoid frying in saturated fat. Add moisture to lean meats by using water, broth, cooking wines, and other nonfat or low-fat sauces along with the cooking methods mentioned above. Soups and stews should be chilled after cooking. The fat that forms on top after a few hours in the refrigerator should be skimmed off. When preparing meals, avoid using excess salt. Salt can contribute to raising blood pressure in some people.  EATING AWAY FROM HOME Order entres, potatoes, and vegetables without sauces or butter. When meat exceeds the size of a deck of cards (3 to 4 ounces), the rest can be taken home for another meal. Choose vegetable or fruit salads and ask for low-calorie salad dressings to be served on the side. Use dressings sparingly. Limit high-fat toppings, such as bacon, crumbled eggs, cheese, sunflower seeds, and olives. Ask for heart-healthy tub margarine instead of butter.  High-Fiber Diet A high-fiber diet changes your normal diet to include more whole grains, legumes, fruits, and vegetables. Changes in the diet involve replacing refined carbohydrates with unrefined foods. The calorie level of the diet is essentially unchanged. The Dietary Reference Intake (recommended amount) for adult males is 38 grams per day. For adult females, it is 25 grams per day. Pregnant and lactating women should consume 28 grams of fiber per day. Fiber is the intact part of a plant that is not broken down during digestion. Functional fiber is fiber that has been isolated from the plant to provide a beneficial effect in the body. PURPOSE  Increase stool  bulk.   Ease and regulate bowel movements.   Lower cholesterol.  REDUCE RISK OF COLON CANCER  INDICATIONS THAT YOU NEED MORE FIBER  Constipation and hemorrhoids.   Uncomplicated diverticulosis (intestine condition) and irritable bowel syndrome.   Weight management.   As a protective measure against hardening of the arteries (atherosclerosis), diabetes, and cancer.   GUIDELINES FOR INCREASING FIBER IN THE DIET  Start adding fiber to the diet slowly. A gradual increase of about 5 more grams (2 slices of whole-wheat bread, 2 servings of most fruits or vegetables, or 1 bowl of high-fiber cereal) per day is best. Too rapid an increase in fiber may result in constipation, flatulence, and bloating.   Drink enough  water and fluids to keep your urine clear or pale yellow. Water, juice, or caffeine-free drinks are recommended. Not drinking enough fluid may cause constipation.   Eat a variety of high-fiber foods rather than one type of fiber.   Try to increase your intake of fiber through using high-fiber foods rather than fiber pills or supplements that contain small amounts of fiber.   The goal is to change the types of food eaten. Do not supplement your present diet with high-fiber foods, but replace foods in your present diet.   INCLUDE A VARIETY OF FIBER SOURCES  Replace refined and processed grains with whole grains, canned fruits with fresh fruits, and incorporate other fiber sources. White rice, white breads, and most bakery goods contain [place] or no fiber.   Brown whole-grain rice, buckwheat oats, and many fruits and vegetables are all good sources of fiber. These include: broccoli, Brussels sprouts, cabbage, cauliflower, beets, sweet potatoes, white potatoes (skin on), carrots, tomatoes, eggplant, squash, berries, fresh fruits, and dried fruits.   Cereals appear to be the richest source of fiber. Cereal fiber is found in whole grains and bran. Bran is the fiber-rich outer coat of  cereal grain, which is largely removed in refining. In whole-grain cereals, the bran remains. In breakfast cereals, the largest amount of fiber is found in those with "bran" in their names. The fiber content is sometimes indicated on the label.   You may need to include additional fruits and vegetables each day.   In baking, for 1 cup white flour, you may use the following substitutions:   1 cup whole-wheat flour minus 2 tablespoons.   1/2 cup white flour plus 1/2 cup whole-wheat flour.   Hiatal Hernia A hiatal hernia occurs when a part of the stomach slides above the diaphragm. The diaphragm is the thin muscle separating the belly (abdomen) from the chest. A hiatal hernia can be something you are born with or develop over time. Hiatal hernias may allow stomach acid to flow back into your esophagus, the tube which carries food from your mouth to your stomach. If this acid causes problems it is called GERD (gastro-esophageal reflux disease).   SYMPTOMS Common symptoms of GERD are heartburn (burning in your chest). This is worse when lying down or bending over. It may also cause belching and indigestion. Some of the things which make GERD worse are:  Increased weight pushes on stomach making acid rise more easily.   Smoking markedly increases acid production.   Alcohol decreases lower esophageal sphincter pressure (valve between stomach and esophagus), allowing acid from stomach into esophagus.   Late evening meals and going to bed with a full stomach increases pressure.   HOME CARE INSTRUCTIONS  Try to achieve and maintain an ideal body weight.   Avoid drinking alcoholic beverages.   Stop smoking.   Put the head of your bed on 4 to 6 inch blocks. This will keep your head and esophagus higher than your stomach. If you cannot use blocks, sleep with several pillows under your head and shoulders.   MINIMIZE THE USE OF aspirin, ibuprofen (Advil or Motrin), or other nonsteroidal  anti-inflammatory drugs.   Do not wear tight clothing around your chest or stomach.   Eat smaller meals and eat more frequently. This keeps your stomach from getting too full. Eat slowly.   Do not lie down for 2 or 3 hours after eating. Do not eat or drink anything 1 to 2 hours before going to bed.  Avoid caffeine beverages (colas, coffee, cocoa, tea), fatty foods, citrus fruits and all other foods and drinks that contain acid and that seem to increase the problems.   Avoid bending over, especially after eating. Also avoid straining during bowel movements or when urinating or lifting things. Anything that increases the pressure in your belly increases the amount of acid that may be pushed up into your esophagus.     REFLUX  SYMPTOMS Common symptoms of GERD are heartburn (burning in your chest). This is worse when lying down or bending over. It may also cause belching, or difficulty swallowing, and indigestion. Some of the things which make GERD worse are:  Increased weight pushes on stomach making acid rise more easily.   Smoking markedly increases acid production.   Alcohol decreases lower esophageal sphincter pressure (valve between stomach and esophagus), allowing acid from stomach into esophagus.   Late evening meals and going to bed with a full stomach increases pressure.   Anything that causes an increase in acid production.    HOME CARE INSTRUCTIONS  Try to achieve and maintain an ideal body weight.   Avoid drinking alcoholic beverages.   Do not wear tight clothing around your chest or stomach.   Eat smaller meals and eat more frequently. This keeps your stomach from getting too full. Eat slowly.   Do not lie down for 2 or 3 hours after eating. Do not eat or drink anything 1 to 2 hours before going to bed.   Avoid caffeine beverages (colas, coffee, cocoa, tea), fatty foods, citrus fruits and all other foods and drinks that contain acid and that seem to increase the  problems.   Avoid bending over, especially after eating OR STRAINING. Anything that increases the pressure in your belly increases the amount of acid that may be pushed up into your esophagus.   Gastritis  Gastritis is an inflammation (the body's way of reacting to injury and/or infection) of the stomach. It is often caused by viral or bacterial (germ) infections. It can also be caused BY ASPIRIN, BC/GOODY POWDER'S, (IBUPROFEN) MOTRIN, OR ALEVE (NAPROXEN), chemicals (including alcohol), SPICY FOODS, and medications. This illness may be associated with generalized malaise (feeling tired, not well), UPPER ABDOMINAL STOMACH cramps, and fever. One common bacterial cause of gastritis is an organism known as H. Pylori. This can be treated with antibiotics.

## 2015-11-01 NOTE — Op Note (Signed)
[facility] Patient Name: James Blankenship Procedure Date: 11/01/2015 11:55 AM MRN: [id number] Date of Birth: May 31, 1966 Attending MD: Barney Drain , MD CSN: [id number] Age: 50 Admit Type: Outpatient Procedure:                Upper GI endoscopy Indications:              Dyspepsia, Dysphagia. PT INTERMITTENTLY TAKING PPIs. Providers:                Barney Drain, MD, Gwenlyn Fudge, RN, Randa Spike,                            Technician Referring MD:              Medicines:                Propofol per Anesthesia Complications:            No immediate complications. Estimated Blood Loss:     Estimated blood loss was minimal. Procedure:                Pre-Anesthesia Assessment:                           - Prior to the procedure, a History and Physical                            was performed, and patient medications and                            allergies were reviewed. The patient's tolerance of                            previous anesthesia was also reviewed. The risks                            and benefits of the procedure and the sedation                            options and risks were discussed with the patient.                            All questions were answered, and informed consent                            was obtained. Prior Anticoagulants: The patient has                            taken ibuprofen, last dose was day of procedure.                            ASA Grade Assessment: II - A patient with mild                            systemic disease. After reviewing the risks and  benefits, the patient was deemed in satisfactory                            condition to undergo the procedure.                           - Prior to the procedure, a History and Physical                            was performed, and patient medications and                            allergies were reviewed. The patient's tolerance of                            previous  anesthesia was also reviewed. The risks                            and benefits of the procedure and the sedation                            options and risks were discussed with the patient.                            All questions were answered, and informed consent                            was obtained. Prior Anticoagulants: The patient has                            taken ibuprofen, last dose was day of procedure.                            ASA Grade Assessment: II - A patient with mild                            systemic disease. After reviewing the risks and                            benefits, the patient was deemed in satisfactory                            condition to undergo the procedure.                           After obtaining informed consent, the endoscope was                            passed under direct vision. Throughout the                            procedure, the patient's blood pressure, pulse, and  oxygen saturations were monitored continuously. The                            [id number] scope was introduced through the                            mouth, and advanced to the second part of duodenum.                            The upper GI endoscopy was accomplished with ease.                            The patient tolerated the procedure fairly well. Scope In: 11:59:49 AM Scope Out: 12:09:26 PM Total Procedure Duration: 0 hours 9 minutes 37 seconds  Findings:      One moderate (circumferential scarring or stenosis; an endoscope may       pass) benign-appearing, intrinsic stenosis was found. This measured 1.4       cm (inner diameter) and was traversed. A guidewire was placed and the       scope was withdrawn. Dilation was performed with a Savary dilator with       moderate resistance at 15 mm, 16 mm and 17 mm.      Segmental moderate inflammation characterized by congestion (edema) and       erythema was found in the gastric  antrum. Biopsies were taken with a       cold forceps for Helicobacter pylori testing.      A small hiatal hernia was present.      The duodenal bulb and second portion of the duodenum were normal. Impression:               - Benign-appearing esophageal stenosis. Dysphagia                            due to peptic stricture                           - Gastritis. Biopsied.                           - Small hiatal hernia.                           - Normal duodenal bulb and second portion of the                            duodenum. Moderate Sedation:      Per Anesthesia Care Recommendation:           - Patient has a contact number available for                            emergencies. The signs and symptoms of potential                            delayed complications were discussed with the  patient. Return to normal activities tomorrow.                            Written discharge instructions were provided to the                            patient.                           - High fiber diet and low fat diet.                           CONTINUE PROTONIX once daily 30 MINUTES PRIOR TO                            BREAKFAST.                           CONTINUE YOUR WEIGHT LOSS EFFORTS. LOSE 20 POUNDS.                           CALL IN ONE MONTH IF SWALLOWING IS NOT IMPROVED.                           Follow up in [place] 2017.                           Next colonoscopy in 10 years.                           - Continue present medications. Procedure Code(s):        --- Professional ---                           [phone], Esophagogastroduodenoscopy, flexible,                            transoral; with insertion of guide wire followed by                            passage of dilator(s) through esophagus over guide                            wire                           [id number], Esophagogastroduodenoscopy, flexible,                            transoral; with biopsy, single or  multiple Diagnosis Code(s):        --- Professional ---                           K22.2, Esophageal obstruction                           K29.70, Gastritis, unspecified, without bleeding  K44.9, Diaphragmatic hernia without obstruction or                            gangrene                           R10.13, Epigastric pain                           R13.10, Dysphagia, unspecified CPT copyright 2016 American [employer] Association. All rights reserved. The codes documented in this report are preliminary and upon coder review may  be revised to meet current compliance requirements. Barney Drain, MD Barney Drain, MD 11/01/2015 9:04:45 PM This report has been signed electronically. Number of Addenda: 0

## 2015-11-03 ENCOUNTER — Encounter (HOSPITAL_COMMUNITY): Payer: Self-pay | Admitting: Gastroenterology

## 2015-11-18 ENCOUNTER — Telehealth: Payer: Self-pay | Admitting: Gastroenterology

## 2015-11-18 NOTE — Telephone Encounter (Addendum)
Please call pt. His stomach Bx shows gastritis.    CONTINUE PROTONIX once daily 30 MINUTES PRIOR TO BREAKFAST.  CONTINUE YOUR WEIGHT LOSS EFFORTS. LOSE 20 POUNDS.  FOLLOW A HIGH FIBER/LOW FAT DIET. AVOID ITEMS THAT CAUSE BLOATING.   PLEASE LET DR. Giuseppina Quinones KNOW IF YOUR SWALLOWING IS NOT IMPROVED.   Follow up in JUL 2017 E30 DYSPHAGIA/GERD.  Next colonoscopy in 10 years.

## 2015-11-20 ENCOUNTER — Telehealth: Payer: Self-pay | Admitting: Gastroenterology

## 2015-11-20 NOTE — Telephone Encounter (Addendum)
ERROR

## 2015-11-21 NOTE — Telephone Encounter (Signed)
Pt is aware of results and states that the swallowing is a lot better.

## 2015-11-21 NOTE — Telephone Encounter (Signed)
APPT MADE AND ON RECALL  °

## 2015-12-20 ENCOUNTER — Emergency Department (HOSPITAL_COMMUNITY)
Admission: EM | Admit: 2015-12-20 | Discharge: 2015-12-20 | Disposition: A | Discharge status: Home / Self Care | Payer: Commercial Managed Care - HMO | Attending: Emergency Medicine | Admitting: Emergency Medicine

## 2015-12-20 ENCOUNTER — Encounter (HOSPITAL_COMMUNITY): Payer: Self-pay | Admitting: *Deleted

## 2015-12-20 DIAGNOSIS — H81399 Other peripheral vertigo, unspecified ear: Secondary | ICD-10-CM | POA: Diagnosis not present

## 2015-12-20 DIAGNOSIS — R42 Dizziness and giddiness: Secondary | ICD-10-CM | POA: Diagnosis present

## 2015-12-20 LAB — CBC WITH DIFFERENTIAL/PLATELET
Basophils Absolute: 0 10*3/uL (ref 0.0–0.1)
Basophils Relative: 1 %
Eosinophils Absolute: 0.2 10*3/uL (ref 0.0–0.7)
Eosinophils Relative: 4 %
HCT: 40.7 % (ref 39.0–52.0)
Hemoglobin: 13.9 g/dL (ref 13.0–17.0)
Lymphocytes Relative: 33 %
Lymphs Abs: 2.2 10*3/uL (ref 0.7–4.0)
MCH: 29.8 pg (ref 26.0–34.0)
MCHC: 34.2 g/dL (ref 30.0–36.0)
MCV: 87.2 fL (ref 78.0–100.0)
Monocytes Absolute: 0.8 10*3/uL (ref 0.1–1.0)
Monocytes Relative: 12 %
Neutro Abs: 3.4 10*3/uL (ref 1.7–7.7)
Neutrophils Relative %: 52 %
Platelets: 185 10*3/uL (ref 150–400)
RBC: 4.67 MIL/uL (ref 4.22–5.81)
RDW: 13.6 % (ref 11.5–15.5)
WBC: 6.6 10*3/uL (ref 4.0–10.5)

## 2015-12-20 LAB — BASIC METABOLIC PANEL
Anion gap: 6 (ref 5–15)
BUN: 17 mg/dL (ref 6–20)
CO2: 24 mmol/L (ref 22–32)
Calcium: 8.6 mg/dL — ABNORMAL LOW (ref 8.9–10.3)
Chloride: 106 mmol/L (ref 101–111)
Creatinine, Ser: 1.15 mg/dL (ref 0.61–1.24)
GFR calc Af Amer: >60 mL/min (ref >60)
GFR calc non Af Amer: >60 mL/min (ref >60)
Glucose, Bld: 115 mg/dL — ABNORMAL HIGH (ref 65–99)
Potassium: 3.9 mmol/L (ref 3.5–5.1)
Sodium: 136 mmol/L (ref 135–145)

## 2015-12-20 MED ORDER — LORAZEPAM 1 MG PO TABS: 1.0000 mg | ORAL_TABLET | Freq: Three times a day (TID) | ORAL | Status: DC | PRN

## 2015-12-20 MED ORDER — MECLIZINE HCL 25 MG PO TABS: 25.0000 mg | ORAL_TABLET | Freq: Three times a day (TID) | ORAL | Status: DC | PRN

## 2015-12-20 MED ORDER — ONDANSETRON HCL 4 MG/2ML IJ SOLN
4.0000 mg | Freq: Once | INTRAMUSCULAR | Status: AC
  Administered 2015-12-20: 4 mg via INTRAVENOUS
  Filled 2015-12-20: qty 2

## 2015-12-20 MED ORDER — LORAZEPAM 1 MG PO TABS
1.0000 mg | ORAL_TABLET | Freq: Once | ORAL | Status: AC
  Administered 2015-12-20: 1 mg via ORAL
  Filled 2015-12-20: qty 1

## 2015-12-20 NOTE — ED Notes (Signed)
Pt states he got up around 4am to go to bathroom and felt disoriented; pt states he continued to feel dizzy; pt states he took a meclizine x30 mins pta

## 2015-12-20 NOTE — Discharge Instructions (Signed)
Continue taking meclizine three times a day as needed for vertigo. Take lorazepam for dizziness not responding to meclizine.  Vertigo Vertigo means you feel like you or your surroundings are moving when they are not. Vertigo can be dangerous if it occurs when you are at work, driving, or performing difficult activities.  CAUSES  Vertigo occurs when there is a conflict of signals sent to your brain from the visual and sensory systems in your body. There are many different causes of vertigo, including:  Infections, especially in the inner ear.  A bad reaction to a drug or misuse of alcohol and medicines.  Withdrawal from drugs or alcohol.  Rapidly changing positions, such as lying down or rolling over in bed.  A migraine headache.  Decreased blood flow to the brain.  Increased pressure in the brain from a head injury, infection, tumor, or bleeding. SYMPTOMS  You may feel as though the world is spinning around or you are falling to the ground. Because your balance is upset, vertigo can cause nausea and vomiting. You may have involuntary eye movements (nystagmus). DIAGNOSIS  Vertigo is usually diagnosed by physical exam. If the cause of your vertigo is unknown, your caregiver may perform imaging tests, such as an MRI scan (magnetic resonance imaging). TREATMENT  Most cases of vertigo resolve on their own, without treatment. Depending on the cause, your caregiver may prescribe certain medicines. If your vertigo is related to body position issues, your caregiver may recommend movements or procedures to correct the problem. In rare cases, if your vertigo is caused by certain inner ear problems, you may need surgery. HOME CARE INSTRUCTIONS   Follow your caregiver's instructions.  Avoid driving.  Avoid operating heavy machinery.  Avoid performing any tasks that would be dangerous to you or others during a vertigo episode.  Tell your caregiver if you notice that certain medicines seem to be  causing your vertigo. Some of the medicines used to treat vertigo episodes can actually make them worse in some people. SEEK IMMEDIATE MEDICAL CARE IF:   Your medicines do not relieve your vertigo or are making it worse.  You develop problems with talking, walking, weakness, or using your arms, hands, or legs.  You develop severe headaches.  Your nausea or vomiting continues or gets worse.  You develop visual changes.  A family member notices behavioral changes.  Your condition gets worse. MAKE SURE YOU:  Understand these instructions.  Will watch your condition.  Will get help right away if you are not doing well or get worse.   This information is not intended to replace advice given to you by your health care provider. Make sure you discuss any questions you have with your health care provider.   Document Released: 04/04/2005 Document Revised: 09/17/2011 Document Reviewed: 10/18/2014 Elsevier Interactive Patient Education 2016 [employer]  Lorazepam tablets What is this medicine? LORAZEPAM (lor A ze pam) is a benzodiazepine. It is used to treat anxiety. This medicine may be used for other purposes; ask your health care provider or pharmacist if you have questions. What should I tell my health care provider before I take this medicine? They need to know if you have any of these conditions: -alcohol or drug abuse problem -bipolar disorder, depression, psychosis or other mental health condition -glaucoma -kidney or liver disease -lung disease or breathing difficulties -myasthenia gravis -Parkinson's disease -seizures or a history of seizures -suicidal thoughts -an unusual or allergic reaction to lorazepam, other benzodiazepines, foods, dyes, or preservatives -pregnant  or trying to get pregnant -breast-feeding How should I use this medicine? Take this medicine by mouth with a glass of water. Follow the directions on the prescription label. If it upsets your stomach,  take it with food or milk. Take your medicine at regular intervals. Do not take it more often than directed. Do not stop taking except on the advice of your doctor or health care professional. Talk to your pediatrician regarding the use of this medicine in children. Special care may be needed. Overdosage: If you think you have taken too much of this medicine contact a poison control center or emergency room at once. NOTE: This medicine is only for you. Do not share this medicine with others. What if I miss a dose? If you miss a dose, take it as soon as you can. If it is almost time for your next dose, take only that dose. Do not take double or extra doses. What may interact with this medicine? -barbiturate medicines for inducing sleep or treating seizures, like phenobarbital -clozapine -medicines for depression, mental problems or psychiatric disturbances -medicines for sleep -phenytoin -probenecid -theophylline -valproic acid This list may not describe all possible interactions. Give your health care provider a list of all the medicines, herbs, non-prescription drugs, or dietary supplements you use. Also tell them if you smoke, drink alcohol, or use illegal drugs. Some items may interact with your medicine. What should I watch for while using this medicine? Visit your doctor or health care professional for regular checks on your progress. Your body may become dependent on this medicine, ask your doctor or health care professional if you still need to take it. However, if you have been taking this medicine regularly for some time, do not suddenly stop taking it. You must gradually reduce the dose or you may get severe side effects. Ask your doctor or health care professional for advice before increasing or decreasing the dose. Even after you stop taking this medicine it can still affect your body for several days. You may get drowsy or dizzy. Do not drive, use machinery, or do anything that needs  mental alertness until you know how this medicine affects you. To reduce the risk of dizzy and fainting spells, do not stand or sit up quickly, especially if you are an older patient. Alcohol may increase dizziness and drowsiness. Avoid alcoholic drinks. Do not treat yourself for coughs, colds or allergies without asking your doctor or health care professional for advice. Some ingredients can increase possible side effects. What side effects may I notice from receiving this medicine? Side effects that you should report to your doctor or health care professional as soon as possible: -changes in vision -confusion -depression -mood changes, excitability or aggressive behavior -movement difficulty, staggering or jerky movements -muscle cramps -restlessness -weakness or tiredness Side effects that usually do not require medical attention (report to your doctor or health care professional if they continue or are bothersome): -constipation or diarrhea -difficulty sleeping, nightmares -dizziness, drowsiness -headache -nausea, vomiting This list may not describe all possible side effects. Call your doctor for medical advice about side effects. You may report side effects to FDA at 1-800-FDA-1088. Where should I keep my medicine? Keep out of the reach of children. This medicine can be abused. Keep your medicine in a safe place to protect it from theft. Do not share this medicine with anyone. Selling or giving away this medicine is dangerous and against the law. This medicine may cause accidental overdose and death if  taken by other adults, children, or pets. Mix any unused medicine with a substance like cat litter or coffee grounds. Then throw the medicine away in a sealed container like a sealed bag or a coffee can with a lid. Do not use the medicine after the expiration date. Store at room temperature between 20 and 25 degrees C (68 and 77 degrees F). Protect from light. Keep container tightly  closed. NOTE: This sheet is a summary. It may not cover all possible information. If you have questions about this medicine, talk to your doctor, pharmacist, or health care provider.    2016, Elsevier/Gold Standard. (2014-03-16 15:24:21)

## 2015-12-20 NOTE — ED Provider Notes (Signed)
CSN: [id number]     Arrival date & time 12/20/15  [id number] History   First MD Initiated Contact with Patient 12/20/15 0630     Chief Complaint  Patient presents with  . Dizziness     (Consider location/radiation/quality/duration/timing/severity/associated sxs/prior Treatment) Patient is a 50 y.o. male presenting with dizziness. The history is provided by the patient.  Dizziness He has a history of vertigo in the past. He woke up at 4 AM to go to the bathroom and noted that he felt off balance as he was walking. There is some associated nausea. He went back to bed and woke up to go to work and noted that the nausea and off balance feeling was still there. He felt worse when he has to do. He felt much worse when he bent over to tie her shoes and then sit up. Any rapid movement seems to make his symptoms worse. His ears feel full, but there is no actual ear pain and no decreased hearing. He did take a dose of meclizine but it does not seem to have helped. Symptoms are similar to what he had with prior episode of vertigo. He denies headache, fever, chills. There is no weakness or numbness or tingling. He denies chest pain.  Past Medical History  Diagnosis Date  . GERD (gastroesophageal reflux disease)   . Gastritis    Past Surgical History  Procedure Laterality Date  . Cholecystectomy    . Vasectomy    . Esophagogastroduodenoscopy  06/10/2009    Distal esophagea stricture dialted to 79mm. Bx from distal esophagus (neg). erythema in body and antrum.  . Esophagogastroduodenoscopy  05/30/2009    distal peptic stricture dialted to 57mm, diffuse bx proven gastritis, no .hpylori  . Hernia repair Left     groin  . Esophagogastroduodenoscopy (egd) with propofol N/A 11/01/2015    Procedure: ESOPHAGOGASTRODUODENOSCOPY (EGD) WITH PROPOFOL;  Surgeon: Danie Binder, MD;  Location: [facility];  Service: Endoscopy;  Laterality: N/A;  1100  . Colonoscopy with propofol N/A 11/01/2015    Procedure:  COLONOSCOPY WITH PROPOFOL;  Surgeon: Danie Binder, MD;  Location: [facility];  Service: Endoscopy;  Laterality: N/A;  . Savory dilation N/A 11/01/2015    Procedure: SAVORY DILATION;  Surgeon: Danie Binder, MD;  Location: [facility];  Service: Endoscopy;  Laterality: N/A;  . Biopsy  11/01/2015    Procedure: BIOPSY;  Surgeon: Danie Binder, MD;  Location: [facility];  Service: Endoscopy;;  gastric   Family History  Problem Relation Age of Onset  . Colon cancer Neg Hx   . Cancer - Other Father     mesothelioma, deceased at age 21    Social History  Substance Use Topics  . Smoking status: Never Smoker   . Smokeless tobacco: None  . Alcohol Use: No    Review of Systems  Neurological: Positive for dizziness.  All other systems reviewed and are negative.     Allergies  Penicillins  Home Medications   Prior to Admission medications   Medication Sig Start Date End Date Taking? Authorizing Provider  acetaminophen (TYLENOL) 500 MG tablet Take 1,000 mg by mouth every 6 (six) hours as needed (pain). Reported on 09/28/2015    Historical Provider, MD  ibuprofen (ADVIL,MOTRIN) 200 MG tablet Take 600 mg by mouth every 6 (six) hours as needed for headache. Reported on 09/28/2015    Historical Provider, MD  pantoprazole (PROTONIX) 40 MG tablet Take 1 tablet (40 mg total) by  mouth daily. 10/05/15   Orvil Feil, NP   BP 133/90 mmHg  Pulse 75  Resp 18  Ht 6\' 1"  (1.854 m)  Wt 212 lb (96.163 kg)  BMI 27.98 kg/m2  SpO2 98% Physical Exam  Nursing note and vitals reviewed.  50 year old male, resting comfortably and in no acute distress. Vital signs are normal. Oxygen saturation is 98%, which is normal. Head is normocephalic and atraumatic. PERRLA, EOMI. Oropharynx is clear. Neck is nontender and supple without adenopathy or JVD. Back is nontender and there is no CVA tenderness. Lungs are clear without rales, wheezes, or rhonchi. Chest is nontender. Heart has regular rate and rhythm  without murmur. Abdomen is soft, flat, nontender without masses or hepatosplenomegaly and peristalsis is normoactive. Extremities have no cyanosis or edema, full range of motion is present. Skin is warm and dry without rash. Neurologic: Mental status is normal, cranial nerves are intact, there are no motor or sensory deficits. There is no nystagmus. Dizziness is reproduced by passive head movement.  ED Course  Procedures (including critical care time) Labs Review Results for orders placed or performed during the hospital encounter of [id number]  Basic metabolic panel  Result Value Ref Range   Sodium 136 135 - 145 mmol/L   Potassium 3.9 3.5 - 5.1 mmol/L   Chloride 106 101 - 111 mmol/L   CO2 24 22 - 32 mmol/L   Glucose, Bld 115 (H) 65 - 99 mg/dL   BUN 17 6 - 20 mg/dL   Creatinine, Ser 1.15 0.61 - 1.24 mg/dL   Calcium 8.6 (L) 8.9 - 10.3 mg/dL   GFR calc non Af Amer >60 >60 mL/min   GFR calc Af Amer >60 >60 mL/min   Anion gap 6 5 - 15  CBC with Differential  Result Value Ref Range   WBC 6.6 4.0 - 10.5 K/uL   RBC 4.67 4.22 - 5.81 MIL/uL   Hemoglobin 13.9 13.0 - 17.0 g/dL   HCT 40.7 39.0 - 52.0 %   MCV 87.2 78.0 - 100.0 fL   MCH 29.8 26.0 - 34.0 pg   MCHC 34.2 30.0 - 36.0 g/dL   RDW 13.6 11.5 - 15.5 %   Platelets 185 150 - 400 K/uL   Neutrophils Relative % 52 %   Neutro Abs 3.4 1.7 - 7.7 K/uL   Lymphocytes Relative 33 %   Lymphs Abs 2.2 0.7 - 4.0 K/uL   Monocytes Relative 12 %   Monocytes Absolute 0.8 0.1 - 1.0 K/uL   Eosinophils Relative 4 %   Eosinophils Absolute 0.2 0.0 - 0.7 K/uL   Basophils Relative 1 %   Basophils Absolute 0.0 0.0 - 0.1 K/uL   I have personally reviewed and evaluated these lab results as part of my medical decision-making.   EKG Interpretation   Date/Time:  Tuesday December 20 2015 06:59:28 EDT Ventricular Rate:  66 PR Interval:  150 QRS Duration: 89 QT Interval:  383 QTC Calculation: 401 R Axis:   63 Text Interpretation:  Sinus rhythm Normal ECG  When compared with ECG of  10/27/2015, No significant change was found Confirmed by [facility]  MD, Deval Mroczka  ([id number]) on 12/20/2015 7:20:06 AM      MDM   Final diagnoses:  Peripheral vertigo, unspecified laterality    Dizziness which seems typical of peripheral vertigo. Since she has not responded to meclizine, will give him a trial lorazepam. IV is started and he is given ondansetron and lorazepam intravenously. Screening labs are obtained.  Old records are reviewed, And he is noted to have had an ED visit in November 2014 for vertigo.  Laboratory workup is unremarkable, although glucoses in the prediabetic range. ECG is normal. Case is signed out to Dr. Reather Converse to evaluate response to lorazepam.  Delora Fuel, [place] [id number] [phone]

## 2016-02-02 ENCOUNTER — Ambulatory Visit: Payer: Commercial Managed Care - HMO | Admitting: Gastroenterology

## 2016-03-02 ENCOUNTER — Ambulatory Visit (INDEPENDENT_AMBULATORY_CARE_PROVIDER_SITE_OTHER): Payer: Commercial Managed Care - HMO | Admitting: Gastroenterology

## 2016-03-02 ENCOUNTER — Encounter: Payer: Self-pay | Admitting: Gastroenterology

## 2016-03-02 ENCOUNTER — Ambulatory Visit: Payer: Commercial Managed Care - HMO | Admitting: Gastroenterology

## 2016-03-02 VITALS — BP 141/79 | HR 80 | Temp 98.3°F | Ht 73.0 in | Wt 208.8 lb

## 2016-03-02 DIAGNOSIS — R1310 Dysphagia, unspecified: Secondary | ICD-10-CM

## 2016-03-02 DIAGNOSIS — K219 Gastro-esophageal reflux disease without esophagitis: Secondary | ICD-10-CM

## 2016-03-02 MED ORDER — PANTOPRAZOLE SODIUM 40 MG PO TBEC: 40.0000 mg | DELAYED_RELEASE_TABLET | Freq: Every day | ORAL | 3 refills | Status: DC

## 2016-03-02 NOTE — Patient Instructions (Signed)
Continue taking Protonix once daily.  We will see you in 1 year!  Good luck on the house hunting!

## 2016-03-02 NOTE — Progress Notes (Signed)
Referring Provider: Celene Squibb, MD Primary Care Physician:  Wende Neighbors, MD  Primary GI: Dr. Oneida Alar   Chief Complaint  Patient presents with  . Follow-up    HPI:   James Blankenship is a 50 y.o. male presenting today with a history of dysphagia, recent EGD with peptic stricture s/p dilation. Colonoscopy completed and normal.  Lost 8 lbs since last seen, purposefully. Doing well without any further dysphagia. No GI complaints. Going to the beach today.    Past Medical History:  Diagnosis Date  . Gastritis   . GERD (gastroesophageal reflux disease)     Past Surgical History:  Procedure Laterality Date  . BIOPSY  11/01/2015   Procedure: BIOPSY;  Surgeon: Danie Binder, MD;  Location: [facility];  Service: Endoscopy;;  gastric  . CHOLECYSTECTOMY    . COLONOSCOPY WITH PROPOFOL N/A 11/01/2015   Dr. Oneida Alar: internal hemorrhoids, otherwise normal.   . ESOPHAGOGASTRODUODENOSCOPY  06/10/2009   Distal esophagea stricture dialted to 66mm. Bx from distal esophagus (neg). erythema in body and antrum.  . ESOPHAGOGASTRODUODENOSCOPY  05/30/2009   distal peptic stricture dialted to 39mm, diffuse bx proven gastritis, no .hpylori  . ESOPHAGOGASTRODUODENOSCOPY (EGD) WITH PROPOFOL N/A 11/01/2015   Dr. Oneida Alar: benign-appearing esophageal stenosis s/p dilation, chronic gastritis   . HERNIA REPAIR Left    groin  . SAVORY DILATION N/A 11/01/2015   Procedure: SAVORY DILATION;  Surgeon: Danie Binder, MD;  Location: [facility];  Service: Endoscopy;  Laterality: N/A;  . VASECTOMY      Current Outpatient Prescriptions  Medication Sig Dispense Refill  . acetaminophen (TYLENOL) 500 MG tablet Take 1,000 mg by mouth every 6 (six) hours as needed (pain). Reported on 09/28/2015    . ibuprofen (ADVIL,MOTRIN) 200 MG tablet Take 600 mg by mouth every 6 (six) hours as needed for headache. Reported on 09/28/2015    . LORazepam (ATIVAN) 1 MG tablet Take 1 tablet (1 mg total) by mouth 3 (three) times daily as  needed for anxiety (or vertigo). 15 tablet 0  . meclizine (ANTIVERT) 25 MG tablet Take 1 tablet (25 mg total) by mouth 3 (three) times daily as needed for dizziness. 15 tablet 0  . pantoprazole (PROTONIX) 40 MG tablet Take 1 tablet (40 mg total) by mouth daily. Take 30 minutes before breakfast 90 tablet 3  . PAZEO 0.7 % SOLN Place 1 drop into both eyes 2 (two) times daily.     No current facility-administered medications for this visit.     Allergies as of 03/02/2016 - Review Complete 03/02/2016  Allergen Reaction Noted  . Penicillins Swelling     Family History  Problem Relation Age of Onset  . Cancer - Other Father     mesothelioma, deceased at age 58   . Colon cancer Neg Hx     Social History   Social History  . Marital status: Divorced    Spouse name: N/A  . Number of children: 2  . Years of education: N/A   Occupational History  .  [employer]   Social History Main Topics  . Smoking status: Never Smoker  . Smokeless tobacco: None  . Alcohol use No  . Drug use: No  . Sexual activity: Yes    Birth control/ protection: Surgical   Other Topics Concern  . None   Social History Narrative  . None    Review of Systems: As mentioned in HPI   Physical Exam: BP Marland Kitchen)  141/79   Pulse 80   Temp 98.3 F (36.8 C) (Oral)   Ht 6\' 1"  (1.854 m)   Wt 208 lb 12.8 oz (94.7 kg)   BMI 27.55 kg/m  General:   Alert and oriented. No distress noted. Pleasant and cooperative.  Head:  Normocephalic and atraumatic. Abdomen:  +BS, soft, non-tender and non-distended. No rebound or guarding. No HSM or masses noted. Msk:  Symmetrical without gross deformities. Normal posture. Extremities:  Without edema. Neurologic:  Alert and  oriented x4;  grossly normal neurologically. Psych:  Alert and cooperative. Normal mood and affect.

## 2016-03-11 ENCOUNTER — Encounter: Payer: Self-pay | Admitting: Gastroenterology

## 2016-03-11 NOTE — Assessment & Plan Note (Signed)
Resolved, s/p dilation.

## 2016-03-11 NOTE — Assessment & Plan Note (Signed)
Continue Protonix once daily. Refills provided. Return in 1 year.  

## 2016-03-13 NOTE — Progress Notes (Signed)
cc'ed to pcp °

## 2016-04-02 ENCOUNTER — Other Ambulatory Visit: Payer: Self-pay | Admitting: Gastroenterology

## 2016-07-23 DIAGNOSIS — D1801 Hemangioma of skin and subcutaneous tissue: Secondary | ICD-10-CM | POA: Diagnosis not present

## 2016-09-13 ENCOUNTER — Emergency Department (HOSPITAL_COMMUNITY): Payer: Commercial Managed Care - HMO

## 2016-09-13 ENCOUNTER — Emergency Department (HOSPITAL_COMMUNITY)
Admission: EM | Admit: 2016-09-13 | Discharge: 2016-09-13 | Disposition: A | Discharge status: Home / Self Care | Payer: Commercial Managed Care - HMO | Attending: Emergency Medicine | Admitting: Emergency Medicine

## 2016-09-13 ENCOUNTER — Encounter (HOSPITAL_COMMUNITY): Payer: Self-pay | Admitting: Emergency Medicine

## 2016-09-13 DIAGNOSIS — J029 Acute pharyngitis, unspecified: Secondary | ICD-10-CM | POA: Diagnosis not present

## 2016-09-13 DIAGNOSIS — Z791 Long term (current) use of non-steroidal anti-inflammatories (NSAID): Secondary | ICD-10-CM | POA: Insufficient documentation

## 2016-09-13 DIAGNOSIS — R079 Chest pain, unspecified: Secondary | ICD-10-CM | POA: Insufficient documentation

## 2016-09-13 DIAGNOSIS — R509 Fever, unspecified: Secondary | ICD-10-CM | POA: Insufficient documentation

## 2016-09-13 DIAGNOSIS — Z79899 Other long term (current) drug therapy: Secondary | ICD-10-CM | POA: Insufficient documentation

## 2016-09-13 DIAGNOSIS — R51 Headache: Secondary | ICD-10-CM | POA: Diagnosis not present

## 2016-09-13 DIAGNOSIS — R05 Cough: Secondary | ICD-10-CM | POA: Insufficient documentation

## 2016-09-13 DIAGNOSIS — R0981 Nasal congestion: Secondary | ICD-10-CM | POA: Diagnosis not present

## 2016-09-13 DIAGNOSIS — R0602 Shortness of breath: Secondary | ICD-10-CM | POA: Insufficient documentation

## 2016-09-13 DIAGNOSIS — R6889 Other general symptoms and signs: Secondary | ICD-10-CM

## 2016-09-13 LAB — RAPID STREP SCREEN (MED CTR MEBANE ONLY): Streptococcus, Group A Screen (Direct): NEGATIVE

## 2016-09-13 MED ORDER — OSELTAMIVIR PHOSPHATE 75 MG PO CAPS
75.0000 mg | ORAL_CAPSULE | Freq: Once | ORAL | Status: AC
  Administered 2016-09-13: 75 mg via ORAL
  Filled 2016-09-13: qty 1

## 2016-09-13 MED ORDER — OSELTAMIVIR PHOSPHATE 75 MG PO CAPS: 75.0000 mg | ORAL_CAPSULE | Freq: Two times a day (BID) | ORAL | 0 refills | Status: DC

## 2016-09-13 MED ORDER — DM-GUAIFENESIN ER 30-600 MG PO TB12: 1.0000 | ORAL_TABLET | Freq: Two times a day (BID) | ORAL | 1 refills | Status: DC

## 2016-09-13 MED ORDER — NAPROXEN 500 MG PO TABS
500.0000 mg | ORAL_TABLET | Freq: Two times a day (BID) | ORAL | 1 refills | Status: DC
Start: 2016-09-13 — End: 2018-06-16

## 2016-09-13 NOTE — ED Provider Notes (Signed)
[place] DEPT Provider Note   CSN: [id number] Arrival date & time: 09/13/16  0741   By signing my name below, I, James Blankenship, attest that this documentation has been prepared under the direction and in the presence of Fredia Sorrow, MD. Electronically Signed: Hilbert Blankenship, Scribe. 09/13/16. 8:33 AM. History   Chief Complaint Chief Complaint  Patient presents with  . Cough   The history is provided by the patient. No language interpreter was used.  Cough  This is a new problem. The current episode started yesterday. The cough is productive of sputum. The fever has been present for less than 1 day. Associated symptoms include chest pain, chills, headaches, sore throat and shortness of breath. Pertinent negatives include no myalgias. He has tried cough syrup for the symptoms. The treatment provided no relief. He is not a smoker.  HPI Comments: James Blankenship is a 51 y.o. male who presents to the Emergency Department complaining of SOB that began yesterday. The patient states that he has had right ear pain for the past week. He reports CP, productive cough, SOB, sore throat, chills, fever, and congestion for the past 1 to 2 days. He has taken OTC tylenol and severe cold relief medications today around 5:30 am with no significant relief. He denies body aches, diarrhea, nausea, and vomiting. He repots having sick contact around 3 to 4 weeks ago.     Past Medical History:  Diagnosis Date  . Gastritis   . GERD (gastroesophageal reflux disease)     Patient Active Problem List   Diagnosis Date Noted  . Dysphagia 09/28/2015  . Encounter for screening colonoscopy 09/28/2015  . AP (abdominal pain) 09/28/2015  . DYSPHAGIA 04/19/2010  . GERD 10/07/2009    Past Surgical History:  Procedure Laterality Date  . BIOPSY  11/01/2015   Procedure: BIOPSY;  Surgeon: Danie Binder, MD;  Location: [facility];  Service: Endoscopy;;  gastric  . CHOLECYSTECTOMY    . COLONOSCOPY WITH  PROPOFOL N/A 11/01/2015   Dr. Oneida Alar: internal hemorrhoids, otherwise normal.   . ESOPHAGOGASTRODUODENOSCOPY  06/10/2009   Distal esophagea stricture dialted to 77mm. Bx from distal esophagus (neg). erythema in body and antrum.  . ESOPHAGOGASTRODUODENOSCOPY  05/30/2009   distal peptic stricture dialted to 7mm, diffuse bx proven gastritis, no .hpylori  . ESOPHAGOGASTRODUODENOSCOPY (EGD) WITH PROPOFOL N/A 11/01/2015   Dr. Oneida Alar: benign-appearing esophageal stenosis s/p dilation, chronic gastritis   . HERNIA REPAIR Left    groin  . SAVORY DILATION N/A 11/01/2015   Procedure: SAVORY DILATION;  Surgeon: Danie Binder, MD;  Location: [facility];  Service: Endoscopy;  Laterality: N/A;  . VASECTOMY         Home Medications    Prior to Admission medications   Medication Sig Start Date End Date Taking? Authorizing Provider  acetaminophen (TYLENOL) 500 MG tablet Take 1,000 mg by mouth every 6 (six) hours as needed (pain). Reported on 09/28/2015    Historical Provider, MD  dextromethorphan-guaiFENesin [facility] DM) 30-600 MG 12hr tablet Take 1 tablet by mouth 2 (two) times daily. 09/13/16   Fredia Sorrow, MD  ibuprofen (ADVIL,MOTRIN) 200 MG tablet Take 600 mg by mouth every 6 (six) hours as needed for headache. Reported on 09/28/2015    Historical Provider, MD  LORazepam (ATIVAN) 1 MG tablet Take 1 tablet (1 mg total) by mouth 3 (three) times daily as needed for anxiety (or vertigo). 11/23/59   Delora Fuel, MD  meclizine (ANTIVERT) 25 MG tablet Take 1 tablet (25  mg total) by mouth 3 (three) times daily as needed for dizziness. 12/20/15   Elnora Morrison, MD  naproxen (NAPROSYN) 500 MG tablet Take 1 tablet (500 mg total) by mouth 2 (two) times daily. 09/13/16   Fredia Sorrow, MD  oseltamivir (TAMIFLU) 75 MG capsule Take 1 capsule (75 mg total) by mouth every 12 (twelve) hours. 09/13/16   Fredia Sorrow, MD  pantoprazole (PROTONIX) 40 MG tablet Take 1 tablet (40 mg total) by mouth daily. Take 30 minutes  before breakfast 03/02/16   Annitta Needs, NP  pantoprazole (PROTONIX) 40 MG tablet TAKE 1 TABLET BY MOUTH EVERY DAY 04/05/16   Annitta Needs, NP  PAZEO 0.7 % SOLN Place 1 drop into both eyes 2 (two) times daily. 10/28/15   Historical Provider, MD    Family History Family History  Problem Relation Age of Onset  . Cancer - Other Father     mesothelioma, deceased at age 28   . Colon cancer Neg Hx     Social History Social History  Substance Use Topics  . Smoking status: Never Smoker  . Smokeless tobacco: Never Used  . Alcohol use No     Allergies   Penicillins   Review of Systems Review of Systems  Constitutional: Positive for chills and fever.  HENT: Positive for congestion and sore throat.   Eyes: Negative for visual disturbance.  Respiratory: Positive for cough and shortness of breath.   Cardiovascular: Positive for chest pain.  Gastrointestinal: Negative for abdominal pain, diarrhea, nausea and vomiting.  Genitourinary: Negative for dysuria and hematuria.  Musculoskeletal: Negative for joint swelling and myalgias.  Skin: Negative for rash.  Neurological: Positive for headaches.  Hematological: Does not bruise/bleed easily.  Psychiatric/Behavioral: Negative for confusion.    Physical Exam Updated Vital Signs BP (!) 132/110 (BP Location: Right Arm)   Pulse 88   Temp 97.8 F (36.6 C) (Oral)   Resp 20   Ht 6\' 1"  (1.854 m)   Wt 96.2 kg   SpO2 99%   BMI 27.97 kg/m   Physical Exam  Constitutional: He is oriented to person, place, and time. He appears well-developed and well-nourished.  HENT:  Head: Normocephalic and atraumatic.  Both TMs normal. Redness to back of throat. Mucous membranes are moist.  Eyes: Conjunctivae and EOM are normal. Pupils are equal, round, and reactive to light. No scleral icterus.  Pupils normal. Sclera clear.  Neck: Normal range of motion.  Cardiovascular: Normal rate, regular rhythm, normal heart sounds and intact distal pulses.     Hearts regular.  Pulmonary/Chest: Effort normal and breath sounds normal. No respiratory distress. He has no wheezes. He has no rales.  Lungs clear bilaterally. No rales, wheezing, or rhonchi.   Abdominal: Soft. He exhibits no distension. There is no tenderness.  Abdomen is non-tender.  Musculoskeletal: Normal range of motion. He exhibits no edema.  No swelling in ankles.  Neurological: He is alert and oriented to person, place, and time.  Skin: Skin is warm and dry.  Psychiatric: He has a normal mood and affect. Judgment normal.  Nursing note and vitals reviewed.   ED Treatments / Results  DIAGNOSTIC STUDIES: Oxygen Saturation is 99% on RA, normal by my interpretation.    COORDINATION OF CARE: 8:07 AM Discussed treatment plan with pt at bedside and pt agreed to plan. I will check the patient's Rapid Strep Test.  Labs (all labs ordered are listed, but only abnormal results are displayed) Labs Reviewed  RAPID STREP SCREEN (NOT  AT [facility]  CULTURE, GROUP A STREP [facility])    EKG  EKG Interpretation None       Radiology Dg Chest 2 View  Result Date: 09/13/2016 CLINICAL DATA:  Chest pain, shortness of breath starting last night, weakness EXAM: CHEST  2 VIEW COMPARISON:  08/05/2014 FINDINGS: The cardiomediastinal silhouette is stable. No infiltrate or pleural effusion. No pulmonary edema. Mild degenerative changes mid thoracic spine. IMPRESSION: No active cardiopulmonary disease. Electronically Signed   By: Lahoma Crocker M.D.   On: 09/13/2016 08:24    Procedures Procedures (including critical care time)  Medications Ordered in ED Medications  oseltamivir (TAMIFLU) capsule 75 mg (75 mg Oral Given 09/13/16 0857)    Initial Impression / Assessment and Plan / ED Course  I have reviewed the triage vital signs and the nursing notes.  Pertinent labs & imaging results that were available during my care of the patient were reviewed by me and considered in my medical decision making (see  chart for details).     Patient nontoxic no acute distress. Symptoms consistent with the flu. Patient is within the 48 hour window so we treated with Tamiflu. First dose provided here. Otherwise symptomatic treatment. Work note provided.  Chest x-Nabeel was negative for pneumonia.  Rapid strep test was negative for strep pharyngitis.  Final Clinical Impressions(s) / ED Diagnoses   Final diagnoses:  Flu-like symptoms    New Prescriptions New Prescriptions   DEXTROMETHORPHAN-GUAIFENESIN (MUCINEX DM) 30-600 MG 12HR TABLET    Take 1 tablet by mouth 2 (two) times daily.   NAPROXEN (NAPROSYN) 500 MG TABLET    Take 1 tablet (500 mg total) by mouth 2 (two) times daily.   OSELTAMIVIR (TAMIFLU) 75 MG CAPSULE    Take 1 capsule (75 mg total) by mouth every 12 (twelve) hours.   I personally performed the services described in this documentation, which was scribed in my presence. The recorded information has been reviewed and is accurate.      Fredia Sorrow, MD 09/13/16 [phone]

## 2016-09-13 NOTE — ED Triage Notes (Signed)
PT c/o right ear pain, productive painful cough with yellow/brown sputum, sore throat, body chills/aches that started this past week. PT states he had OTC tylenol and severe cold relief meds around 0530 this am.

## 2016-09-13 NOTE — Discharge Instructions (Signed)
Chest x-Trevor negative for pneumonia rapid strep test negative for strep pharyngitis. Symptoms most consistent with flulike illness. Take the Tamiflu as directed. Take the Naprosyn Mucinex DM as needed for flu symptoms. Work note provided. Return for any new or worse symptoms.

## 2016-09-15 LAB — CULTURE, GROUP A STREP (THRC)

## 2016-10-03 DIAGNOSIS — R05 Cough: Secondary | ICD-10-CM | POA: Diagnosis not present

## 2016-10-18 DIAGNOSIS — R7301 Impaired fasting glucose: Secondary | ICD-10-CM | POA: Diagnosis not present

## 2016-10-18 DIAGNOSIS — I10 Essential (primary) hypertension: Secondary | ICD-10-CM | POA: Diagnosis not present

## 2016-10-18 DIAGNOSIS — E782 Mixed hyperlipidemia: Secondary | ICD-10-CM | POA: Diagnosis not present

## 2016-10-23 DIAGNOSIS — E782 Mixed hyperlipidemia: Secondary | ICD-10-CM | POA: Diagnosis not present

## 2016-10-23 DIAGNOSIS — K210 Gastro-esophageal reflux disease with esophagitis: Secondary | ICD-10-CM | POA: Diagnosis not present

## 2016-10-23 DIAGNOSIS — R7301 Impaired fasting glucose: Secondary | ICD-10-CM | POA: Diagnosis not present

## 2017-01-13 ENCOUNTER — Emergency Department (HOSPITAL_COMMUNITY)
Admission: EM | Admit: 2017-01-13 | Discharge: 2017-01-13 | Disposition: A | Discharge status: Home / Self Care | Payer: 59 | Attending: Emergency Medicine | Admitting: Emergency Medicine

## 2017-01-13 ENCOUNTER — Encounter (HOSPITAL_COMMUNITY): Payer: Self-pay | Admitting: *Deleted

## 2017-01-13 ENCOUNTER — Emergency Department (HOSPITAL_COMMUNITY): Payer: 59

## 2017-01-13 DIAGNOSIS — R2242 Localized swelling, mass and lump, left lower limb: Secondary | ICD-10-CM | POA: Insufficient documentation

## 2017-01-13 DIAGNOSIS — M79675 Pain in left toe(s): Secondary | ICD-10-CM | POA: Diagnosis not present

## 2017-01-13 DIAGNOSIS — M7989 Other specified soft tissue disorders: Secondary | ICD-10-CM

## 2017-01-13 DIAGNOSIS — Z79899 Other long term (current) drug therapy: Secondary | ICD-10-CM | POA: Diagnosis not present

## 2017-01-13 DIAGNOSIS — M79672 Pain in left foot: Secondary | ICD-10-CM | POA: Insufficient documentation

## 2017-01-13 MED ORDER — HYDROCODONE-ACETAMINOPHEN 5-325 MG PO TABS: 1.0000 | ORAL_TABLET | ORAL | 0 refills | Status: DC | PRN

## 2017-01-13 MED ORDER — INDOMETHACIN 50 MG PO CAPS: 50.0000 mg | ORAL_CAPSULE | Freq: Three times a day (TID) | ORAL | 0 refills | Status: DC

## 2017-01-13 MED ORDER — NAPROXEN 250 MG PO TABS: 500.0000 mg | ORAL_TABLET | Freq: Once | ORAL | Status: DC

## 2017-01-13 MED ORDER — HYDROCODONE-ACETAMINOPHEN 5-325 MG PO TABS
1.0000 | ORAL_TABLET | Freq: Once | ORAL | Status: AC
  Administered 2017-01-13: 1 via ORAL
  Filled 2017-01-13: qty 1

## 2017-01-13 MED ORDER — SULFAMETHOXAZOLE-TRIMETHOPRIM 800-160 MG PO TABS: 1.0000 | ORAL_TABLET | Freq: Two times a day (BID) | ORAL | 0 refills | Status: AC

## 2017-01-13 MED ORDER — SULFAMETHOXAZOLE-TRIMETHOPRIM 800-160 MG PO TABS
1.0000 | ORAL_TABLET | Freq: Once | ORAL | Status: AC
  Administered 2017-01-13: 1 via ORAL
  Filled 2017-01-13: qty 1

## 2017-01-13 MED ORDER — INDOMETHACIN 25 MG PO CAPS
50.0000 mg | ORAL_CAPSULE | Freq: Once | ORAL | Status: DC
  Filled 2017-01-13: qty 2

## 2017-01-13 MED ORDER — INDOMETHACIN 25 MG PO CAPS
50.0000 mg | ORAL_CAPSULE | Freq: Once | ORAL | Status: AC
  Administered 2017-01-13: 50 mg via ORAL
  Filled 2017-01-13: qty 2

## 2017-01-13 NOTE — ED Provider Notes (Signed)
[name] Provider Note   CSN: [id number] Arrival date & time: 01/13/17  1921     History   Chief Complaint Chief Complaint  Patient presents with  . Foot Swelling    HPI James Blankenship is a 51 y.o. male with no significant past medical history presenting with left great toe pain, redness and swelling which started yesterday when at the beach.  He denies any injury that he is aware of, reports spent a good deal of time in the water, perhaps stubbed the toe but also worried that he may have been bit by something but does not recall the inciting event nor was he able to find a puncture or bite site. Yesterday the toe was red and warm to touch and simply allowing the bed sheet touch the toe made pain worse, but this is better today. Denies gout hx.  He has taken tylenol without relief. .  The history is provided by the patient.    Past Medical History:  Diagnosis Date  . Gastritis   . GERD (gastroesophageal reflux disease)     Patient Active Problem List   Diagnosis Date Noted  . Dysphagia 09/28/2015  . Encounter for screening colonoscopy 09/28/2015  . AP (abdominal pain) 09/28/2015  . DYSPHAGIA 04/19/2010  . GERD 10/07/2009    Past Surgical History:  Procedure Laterality Date  . BIOPSY  11/01/2015   Procedure: BIOPSY;  Surgeon: Danie Binder, MD;  Location: [facility];  Service: Endoscopy;;  gastric  . CHOLECYSTECTOMY    . COLONOSCOPY WITH PROPOFOL N/A 11/01/2015   Dr. Oneida Alar: internal hemorrhoids, otherwise normal.   . ESOPHAGOGASTRODUODENOSCOPY  06/10/2009   Distal esophagea stricture dialted to 53mm. Bx from distal esophagus (neg). erythema in body and antrum.  . ESOPHAGOGASTRODUODENOSCOPY  05/30/2009   distal peptic stricture dialted to 4mm, diffuse bx proven gastritis, no .hpylori  . ESOPHAGOGASTRODUODENOSCOPY (EGD) WITH PROPOFOL N/A 11/01/2015   Dr. Oneida Alar: benign-appearing esophageal stenosis s/p dilation, chronic gastritis   . HERNIA REPAIR Left    groin  . SAVORY DILATION N/A 11/01/2015   Procedure: SAVORY DILATION;  Surgeon: Danie Binder, MD;  Location: [facility];  Service: Endoscopy;  Laterality: N/A;  . VASECTOMY         Home Medications    Prior to Admission medications   Medication Sig Start Date End Date Taking? Authorizing Provider  acetaminophen (TYLENOL) 500 MG tablet Take 1,000 mg by mouth every 6 (six) hours as needed (pain). Reported on 09/28/2015    [provider]  dextromethorphan-guaiFENesin (MUCINEX DM) 30-600 MG 12hr tablet Take 1 tablet by mouth 2 (two) times daily. 09/13/16   Fredia Sorrow, MD  HYDROcodone-acetaminophen (NORCO/VICODIN) 5-325 MG tablet Take 1 tablet by mouth every 4 (four) hours as needed. 01/13/17   Evalee Jefferson, PA-C  ibuprofen (ADVIL,MOTRIN) 200 MG tablet Take 600 mg by mouth every 6 (six) hours as needed for headache. Reported on 09/28/2015    [provider]  indomethacin (INDOCIN) 50 MG capsule Take 1 capsule (50 mg total) by mouth 3 (three) times daily with meals. 01/13/17   Calum Cormier, Almyra Free, PA-C  LORazepam (ATIVAN) 1 MG tablet Take 1 tablet (1 mg total) by mouth 3 (three) times daily as needed for anxiety (or vertigo). 2/72/53   Delora Fuel, MD  meclizine (ANTIVERT) 25 MG tablet Take 1 tablet (25 mg total) by mouth 3 (three) times daily as needed for dizziness. 12/20/15   Elnora Morrison, MD  naproxen (NAPROSYN) 500 MG tablet  Take 1 tablet (500 mg total) by mouth 2 (two) times daily. 09/13/16   Fredia Sorrow, MD  oseltamivir (TAMIFLU) 75 MG capsule Take 1 capsule (75 mg total) by mouth every 12 (twelve) hours. 09/13/16   Fredia Sorrow, MD  pantoprazole (PROTONIX) 40 MG tablet Take 1 tablet (40 mg total) by mouth daily. Take 30 minutes before breakfast 03/02/16   Annitta Needs, NP  pantoprazole (PROTONIX) 40 MG tablet TAKE 1 TABLET BY MOUTH EVERY DAY 04/05/16   Annitta Needs, NP  PAZEO 0.7 % SOLN Place 1 drop into both eyes 2 (two) times daily. 10/28/15   [provider]    sulfamethoxazole-trimethoprim (BACTRIM DS,SEPTRA DS) 800-160 MG tablet Take 1 tablet by mouth 2 (two) times daily. 01/13/17 01/20/17  Evalee Jefferson, PA-C    Family History Family History  Problem Relation Age of Onset  . Cancer - Other Father        mesothelioma, deceased at age 8   . Colon cancer Neg Hx     Social History Social History  Substance Use Topics  . Smoking status: Never Smoker  . Smokeless tobacco: Never Used  . Alcohol use No     Allergies   Penicillins   Review of Systems Review of Systems  Constitutional: Negative for chills and fever.  Respiratory: Negative.   Cardiovascular: Negative.   Gastrointestinal: Negative.   Musculoskeletal: Positive for arthralgias. Negative for joint swelling and myalgias.  Skin: Positive for color change. Negative for wound.  Neurological: Negative for weakness and numbness.     Physical Exam Updated Vital Signs BP (!) 133/97 (BP Location: Right Arm)   Pulse 70   Temp 98.2 F (36.8 C) (Oral)   Resp 18   Ht 6\' 1"  (1.854 m)   Wt 96.2 kg (212 lb)   SpO2 99%   BMI 27.97 kg/m   Physical Exam  Constitutional: He appears well-developed and well-nourished.  HENT:  Head: Atraumatic.  Cardiovascular: Normal rate.   Pulses:      Dorsalis pedis pulses are 2+ on the right side, and 2+ on the left side.  Pulses equal bilaterally  Pulmonary/Chest: Effort normal.  Musculoskeletal: He exhibits edema and tenderness.  Neurological: He is alert. He has normal strength. He displays normal reflexes. No sensory deficit.  Skin: Skin is warm and dry. There is erythema.  Mild erythema and increased warmth of great toe, dorsally and plantar.  Nail plate cut very short, no cuticle edema or drainage. No joint inflammation. Skin intact.   Psychiatric: He has a normal mood and affect.     ED Treatments / Results  Labs (all labs ordered are listed, but only abnormal results are displayed) Labs Reviewed - No data to display  EKG  EKG  Interpretation None       Radiology Dg Toe Great Left  Result Date: 01/13/2017 CLINICAL DATA:  Pain and swelling, no injury. EXAM: LEFT GREAT TOE COMPARISON:  None. FINDINGS: There is no evidence of fracture or dislocation. There is no evidence of arthropathy or other focal bone abnormality. Soft tissues are unremarkable. IMPRESSION: Negative. Electronically Signed   By: Elon Alas M.D.   On: 01/13/2017 20:26    Procedures Procedures (including critical care time)  Medications Ordered in ED Medications  HYDROcodone-acetaminophen (NORCO/VICODIN) 5-325 MG per tablet 1 tablet (1 tablet Oral Given 01/13/17 2003)  sulfamethoxazole-trimethoprim (BACTRIM DS,SEPTRA DS) 800-160 MG per tablet 1 tablet (1 tablet Oral Given 01/13/17 2110)  indomethacin (INDOCIN) capsule 50 mg (50  mg Oral Given 01/13/17 2110)     Initial Impression / Assessment and Plan / ED Course  I have reviewed the triage vital signs and the nursing notes.  Pertinent labs & imaging results that were available during my care of the patient were reviewed by me and considered in my medical decision making (see chart for details).     Exam c/w skin infection/cellulitis, cannot rule out gout although pt denies hx of this disease.  He was placed on bactrim, hydrocodone, indocin, advised heat, elevation, recheck by pcp if not improving within 1-2 days or for worsened sx.  Return precautions discussed.    Final Clinical Impressions(s) / ED Diagnoses   Final diagnoses:  Pain and swelling of toe of left foot    New Prescriptions New Prescriptions   HYDROCODONE-ACETAMINOPHEN (NORCO/VICODIN) 5-325 MG TABLET    Take 1 tablet by mouth every 4 (four) hours as needed.   INDOMETHACIN (INDOCIN) 50 MG CAPSULE    Take 1 capsule (50 mg total) by mouth 3 (three) times daily with meals.   SULFAMETHOXAZOLE-TRIMETHOPRIM (BACTRIM DS,SEPTRA DS) 800-160 MG TABLET    Take 1 tablet by mouth 2 (two) times daily.     Evalee Jefferson, PA-C 01/13/17  2122    Noemi Chapel, MD 01/13/17 2352

## 2017-01-13 NOTE — ED Triage Notes (Signed)
Pt reports pain in his left big toe starting on Friday. Pt reports swelling to his left big toe late Friday night and has gotten progressively worse since Friday.

## 2017-01-13 NOTE — Discharge Instructions (Signed)
You are being treated for any early cellulitis (skin infection) however,  given the pain and swelling, you are also being given an anti inflammatory medicine which will treat gout if this is indeed the source of your symptoms.  Elevate and apply warm compresses to the area of pain frequently throughout the day.  Followup with your doctor for a recheck if not improving or for any worsening of your pain.  You may take the hydrocodone prescribed for pain relief.  This will make you drowsy - do not drive within 4 hours of taking this medication.

## 2017-02-19 DIAGNOSIS — D229 Melanocytic nevi, unspecified: Secondary | ICD-10-CM | POA: Diagnosis not present

## 2017-02-19 DIAGNOSIS — L723 Sebaceous cyst: Secondary | ICD-10-CM | POA: Diagnosis not present

## 2017-04-03 ENCOUNTER — Other Ambulatory Visit: Payer: Self-pay | Admitting: Gastroenterology

## 2017-04-22 DIAGNOSIS — R7301 Impaired fasting glucose: Secondary | ICD-10-CM | POA: Diagnosis not present

## 2017-04-22 DIAGNOSIS — E782 Mixed hyperlipidemia: Secondary | ICD-10-CM | POA: Diagnosis not present

## 2017-04-22 DIAGNOSIS — Z0000 Encounter for general adult medical examination without abnormal findings: Secondary | ICD-10-CM | POA: Diagnosis not present

## 2017-04-22 DIAGNOSIS — Z125 Encounter for screening for malignant neoplasm of prostate: Secondary | ICD-10-CM | POA: Diagnosis not present

## 2017-04-24 DIAGNOSIS — R7301 Impaired fasting glucose: Secondary | ICD-10-CM | POA: Diagnosis not present

## 2017-04-25 ENCOUNTER — Other Ambulatory Visit: Payer: Self-pay | Admitting: Dermatology

## 2017-08-13 DIAGNOSIS — R070 Pain in throat: Secondary | ICD-10-CM | POA: Diagnosis not present

## 2017-09-17 DIAGNOSIS — E782 Mixed hyperlipidemia: Secondary | ICD-10-CM | POA: Diagnosis not present

## 2017-09-17 DIAGNOSIS — R7301 Impaired fasting glucose: Secondary | ICD-10-CM | POA: Diagnosis not present

## 2017-10-08 DIAGNOSIS — K219 Gastro-esophageal reflux disease without esophagitis: Secondary | ICD-10-CM | POA: Diagnosis not present

## 2017-10-08 DIAGNOSIS — E782 Mixed hyperlipidemia: Secondary | ICD-10-CM | POA: Diagnosis not present

## 2017-10-08 DIAGNOSIS — R7301 Impaired fasting glucose: Secondary | ICD-10-CM | POA: Diagnosis not present

## 2017-12-07 DIAGNOSIS — R197 Diarrhea, unspecified: Secondary | ICD-10-CM | POA: Diagnosis not present

## 2017-12-07 DIAGNOSIS — R1110 Vomiting, unspecified: Secondary | ICD-10-CM | POA: Diagnosis not present

## 2017-12-07 DIAGNOSIS — K529 Noninfective gastroenteritis and colitis, unspecified: Secondary | ICD-10-CM | POA: Diagnosis not present

## 2017-12-11 DIAGNOSIS — K529 Noninfective gastroenteritis and colitis, unspecified: Secondary | ICD-10-CM | POA: Diagnosis not present

## 2018-01-23 DIAGNOSIS — R6882 Decreased libido: Secondary | ICD-10-CM | POA: Diagnosis not present

## 2018-01-23 DIAGNOSIS — E559 Vitamin D deficiency, unspecified: Secondary | ICD-10-CM | POA: Diagnosis not present

## 2018-01-23 DIAGNOSIS — Z125 Encounter for screening for malignant neoplasm of prostate: Secondary | ICD-10-CM | POA: Diagnosis not present

## 2018-01-23 DIAGNOSIS — K210 Gastro-esophageal reflux disease with esophagitis: Secondary | ICD-10-CM | POA: Diagnosis not present

## 2018-01-23 DIAGNOSIS — R5383 Other fatigue: Secondary | ICD-10-CM | POA: Diagnosis not present

## 2018-01-23 DIAGNOSIS — Z0389 Encounter for observation for other suspected diseases and conditions ruled out: Secondary | ICD-10-CM | POA: Diagnosis not present

## 2018-02-03 DIAGNOSIS — L820 Inflamed seborrheic keratosis: Secondary | ICD-10-CM | POA: Diagnosis not present

## 2018-02-03 DIAGNOSIS — D229 Melanocytic nevi, unspecified: Secondary | ICD-10-CM | POA: Diagnosis not present

## 2018-03-03 DIAGNOSIS — R6882 Decreased libido: Secondary | ICD-10-CM | POA: Diagnosis not present

## 2018-03-03 DIAGNOSIS — K210 Gastro-esophageal reflux disease with esophagitis: Secondary | ICD-10-CM | POA: Diagnosis not present

## 2018-03-03 DIAGNOSIS — E559 Vitamin D deficiency, unspecified: Secondary | ICD-10-CM | POA: Diagnosis not present

## 2018-03-06 DIAGNOSIS — E291 Testicular hypofunction: Secondary | ICD-10-CM | POA: Diagnosis not present

## 2018-04-03 DIAGNOSIS — E669 Obesity, unspecified: Secondary | ICD-10-CM | POA: Diagnosis not present

## 2018-04-03 DIAGNOSIS — R0683 Snoring: Secondary | ICD-10-CM | POA: Diagnosis not present

## 2018-04-03 DIAGNOSIS — R5383 Other fatigue: Secondary | ICD-10-CM | POA: Diagnosis not present

## 2018-05-13 DIAGNOSIS — R5383 Other fatigue: Secondary | ICD-10-CM | POA: Diagnosis not present

## 2018-05-13 DIAGNOSIS — R0683 Snoring: Secondary | ICD-10-CM | POA: Diagnosis not present

## 2018-05-13 DIAGNOSIS — R6882 Decreased libido: Secondary | ICD-10-CM | POA: Diagnosis not present

## 2018-05-13 DIAGNOSIS — E559 Vitamin D deficiency, unspecified: Secondary | ICD-10-CM | POA: Diagnosis not present

## 2018-06-16 ENCOUNTER — Ambulatory Visit: Payer: 59 | Admitting: Neurology

## 2018-06-16 ENCOUNTER — Encounter: Payer: Self-pay | Admitting: Neurology

## 2018-06-16 VITALS — BP 159/91 | HR 81 | Ht 73.0 in | Wt 223.0 lb

## 2018-06-16 DIAGNOSIS — R002 Palpitations: Secondary | ICD-10-CM

## 2018-06-16 DIAGNOSIS — G4719 Other hypersomnia: Secondary | ICD-10-CM | POA: Diagnosis not present

## 2018-06-16 DIAGNOSIS — K219 Gastro-esophageal reflux disease without esophagitis: Secondary | ICD-10-CM | POA: Diagnosis not present

## 2018-06-16 DIAGNOSIS — R61 Generalized hyperhidrosis: Secondary | ICD-10-CM

## 2018-06-16 DIAGNOSIS — G4710 Hypersomnia, unspecified: Secondary | ICD-10-CM | POA: Diagnosis not present

## 2018-06-16 DIAGNOSIS — R0683 Snoring: Secondary | ICD-10-CM | POA: Diagnosis not present

## 2018-06-16 DIAGNOSIS — G4726 Circadian rhythm sleep disorder, shift work type: Secondary | ICD-10-CM

## 2018-06-16 DIAGNOSIS — G4730 Sleep apnea, unspecified: Secondary | ICD-10-CM

## 2018-06-16 DIAGNOSIS — R0981 Nasal congestion: Secondary | ICD-10-CM

## 2018-06-16 DIAGNOSIS — R351 Nocturia: Secondary | ICD-10-CM

## 2018-06-16 DIAGNOSIS — M2629 Other anomalies of dental arch relationship: Secondary | ICD-10-CM

## 2018-06-16 NOTE — Progress Notes (Signed)
SLEEP MEDICINE CLINIC   Provider:  Larey Seat, MD   Primary Care Physician:  Doree Albee, MD   Referring Provider: Delane Ginger. Anastasio Champion, MD    Chief Complaint  Patient presents with  . New Patient (Initial Visit)    rm 10, pt with wife. pt here today due to not sleeping well. wakes up with choking feeling. wakes up tired. he is a shift Insurance underwriter. when he is asleep the wife states he doesnt take a deep enough sleep.pt had a sleep study 2015. mouth breather in sleep. pt snores.    HPI:  James Blankenship is a 52 y.o. male patient and seen here on 06-16-2018 as in a referral from Hurshel Party, MD.  James Blankenship is seen here in the presence of his wife. He works shifts- 12 midnight to 8 AM , 6 nights in a row, and one night off.  He has had nasal breathing problems throughout his adult live. He has been diagnosed with nasal septal deviation and an hypoplastic mandible.  He has trouble getting a deep breath. I would like to summarize a visit that James Blankenship had with Melida Quitter, MD over at what is now [facility]NT , he was told that he may have to have a reconstructive surgery as well as a nasal septum surgery, possibly even open up the sinuses and turbinate reduction.  He was referred for sleep study which was interpreted by Baird Lyons.  The study was performed on 05 January 2014-  the raw data were also available for me to see I would like to add that his sleep efficiency was only 76%.The patient only had an AHI of 3.6/h sinus rhythm with occasional atrial contractions was noted he was actually moderately to loud snoring he did not have oxygen desaturation.  The patient was frequently coughing and trying to clear his airway throughout the night feeling as if he was choking.  There was not enough apnea however noted to qualify him for a CPAP titration.       Chief complaint according to patient : "My nose is blocked, and I can't sleep without choking".   Sleep habits are as follows:  The patient returns from work in the early morning hours, he has to commute from [place] to [place]. His bedroom is filled with light, TV on, loud volume, and he feels hot all the time.   By 10 AM he will be in his bedroom and usually watching TV, most days he seems to be able to initiate sleep between 1030 and 11 AM, staying asleep until 530 or 6 PM.  However he does not feel as if he can sleep at all, he is constantly fatigued and he tells his wife that he feels he has not slept.  He may be just dozing- he has also 3-4 bathroom breaks and snores reportedly loudly.  Only on vacations will he sleep at night and without interruption.    Sleep medical history: He just started testosterone shots.  Family sleep history: father had a loud snore, mother has insomnia. Mother was a night shift Insurance underwriter.   Social history: married, shift Insurance underwriter. 2 adult children.  No tobacco use, once a month ETOH- or less, caffeine- coke , pepsi , mountain dew- 5-6 cans a day, extra tea at lunch and supper- coffee rarely, no energy drinks.    Review of Systems: Out of a complete 14 system review, the patient complains of only the following symptoms, and  all other reviewed systems are negative.  snoring, insomnia, shift work, nocturia, choking, feeling hot, diaphoresis. Palpitations. gets SOB on stairs.   Epworth score  13- 16/ 24 , Fatigue severity score: 24/ 63   , depression score N/a    Social History   Socioeconomic History  . Marital status: Divorced    Spouse name: Not on file  . Number of children: 2  . Years of education: Not on file  . Highest education level: Not on file  Occupational History    Employer: [employer]  . Financial resource strain: Not on file  . Food insecurity:    Worry: Not on file    Inability: Not on file  . Transportation needs:    Medical: Not on file    Non-medical: Not on file  Tobacco Use  . Smoking status: Never Smoker  . Smokeless tobacco:  Never Used  Substance and Sexual Activity  . Alcohol use: No    Alcohol/week: 0.0 standard drinks  . Drug use: No  . Sexual activity: Yes    Birth control/protection: Surgical  Lifestyle  . Physical activity:    Days per week: Not on file    Minutes per session: Not on file  . Stress: Not on file  Relationships  . Social connections:    Talks on phone: Not on file    Gets together: Not on file    Attends religious service: Not on file    Active member of club or organization: Not on file    Attends meetings of clubs or organizations: Not on file    Relationship status: Not on file  . Intimate partner violence:    Fear of current or ex partner: Not on file    Emotionally abused: Not on file    Physically abused: Not on file    Forced sexual activity: Not on file  Other Topics Concern  . Not on file  Social History Narrative  . Not on file    Family History  Problem Relation Age of Onset  . Cancer - Other Father        mesothelioma, deceased at age 57   . Colon cancer Neg Hx     Past Medical History:  Diagnosis Date  . Gastritis   . GERD (gastroesophageal reflux disease)     Past Surgical History:  Procedure Laterality Date  . BIOPSY  11/01/2015   Procedure: BIOPSY;  Surgeon: Danie Binder, MD;  Location: [facility];  Service: Endoscopy;;  gastric  . CHOLECYSTECTOMY    . COLONOSCOPY WITH PROPOFOL N/A 11/01/2015   Dr. Oneida Alar: internal hemorrhoids, otherwise normal.   . ESOPHAGOGASTRODUODENOSCOPY  06/10/2009   Distal esophagea stricture dialted to 36mm. Bx from distal esophagus (neg). erythema in body and antrum.  . ESOPHAGOGASTRODUODENOSCOPY  05/30/2009   distal peptic stricture dialted to 44mm, diffuse bx proven gastritis, no .hpylori  . ESOPHAGOGASTRODUODENOSCOPY (EGD) WITH PROPOFOL N/A 11/01/2015   Dr. Oneida Alar: benign-appearing esophageal stenosis s/p dilation, chronic gastritis   . HERNIA REPAIR Left    groin  . SAVORY DILATION N/A 11/01/2015   Procedure:  SAVORY DILATION;  Surgeon: Danie Binder, MD;  Location: [facility];  Service: Endoscopy;  Laterality: N/A;  . VASECTOMY      Current Outpatient Medications  Medication Sig Dispense Refill  . acetaminophen (TYLENOL) 500 MG tablet Take 1,000 mg by mouth every 6 (six) hours as needed (pain). Reported on 09/28/2015    . fexofenadine (  ALLEGRA) 180 MG tablet Take 180 mg by mouth daily.    . pantoprazole (PROTONIX) 40 MG tablet TAKE 1 TABLET BY MOUTH EVERY DAY 90 tablet 3  . Testosterone Cypionate 200 MG/ML SOLN Inject 100 mg as directed once a week.    Marland Kitchen VITAMIN D PO Take 125 mcg by mouth daily.     No current facility-administered medications for this visit.     Allergies as of 06/16/2018 - Review Complete 06/16/2018  Allergen Reaction Noted  . Penicillins Swelling     Vitals: BP (!) 159/91   Pulse 81   Ht 6\' 1"  (1.854 m)   Wt 223 lb (101.2 kg)   BMI 29.42 kg/m  Last Weight:  Wt Readings from Last 1 Encounters:  06/16/18 223 lb (101.2 kg)   PPJ:KDTO mass index is 29.42 kg/m.     Last Height:   Ht Readings from Last 1 Encounters:  06/16/18 6\' 1"  (1.854 m)    Physical exam:  General: The patient is awake, alert and appears not in acute distress. The patient is well groomed. Head: Normocephalic, atraumatic. Neck is supple. Mallampati: 5 - can't see uvula.   neck circumference:17. 5 " . Nasal airflow congested- , Retrognathia is seen. Overbite .  Wore braces in childhood.  Cardiovascular:  Regular rate and rhythm  without  murmurs or carotid bruit, and without distended neck veins. Respiratory: Lungs are clear to auscultation. Skin:  Without evidence of edema, or rash Trunk: BMI is  29.4 . The patient's posture is relaxed   Neurologic exam : The patient is awake and alert, oriented to place and time.   Memory subjective described as intact.  Attention span & concentration ability appears normal.  Speech is fluent,  without  dysarthria, dysphonia or aphasia.  Mood and  affect are appropriate.  Cranial nerves: Pupils are equal and briskly reactive to light. Funduscopic exam without evidence of pallor or edema. Extraocular movements  in vertical and horizontal planes intact and without nystagmus. Visual fields by finger perimetry are intact. Hearing to finger rub intact.  Facial sensation intact to fine touch.  Facial motor strength is symmetric and tongue and uvula move midline. Shoulder shrug was symmetrical.   Motor exam:  Normal tone, muscle bulk and symmetric strength in all extremities.  Sensory:  Fine touch, pinprick and vibration were tested in all extremities. Proprioception tested in the upper extremities was normal.  Coordination: Rapid alternating movements in the fingers/hands was normal. Finger-to-nose maneuver  normal without evidence of ataxia, dysmetria or tremor.  Gait and station: Patient walks without assistive device and is able unassisted to climb up to the exam table.  Strength within normal limits.  Stance is stable and normal.    Deep tendon reflexes: in the  upper and lower extremities are symmetric and intact.   Assessment:  After physical and neurologic examination, review of laboratory studies,  Personal review of imaging studies, reports of other /same  Imaging studies, results of polysomnography and / or neurophysiology testing and pre-existing records as far as provided in visit., my assessment is   1) shift work circadian sleep disorder - he sleeps poorly, 5-6 hours with many interruptions by snoring , choking and frequent nocturia- all can be also related to OSA.   2) excessive daytime sleepiness- result of low testosterone, shift work , sleep hygiene , choking, snoring and high caffeine consumption.  Hypertension.   3) OSA risk factors, overweight,  chronic nasal obstruction, high grade Mallampati and excess  soft tissue with overbite.  Patient will need an attended sleep study, followed by ENT evaluation for INSPIRE  procedure.    The patient was advised of the nature of the diagnosed disorder , the treatment options and the  risks for general health and wellness arising from not treating the condition.   I spent more than 40 minutes of face to face time with the patient.  Greater than 50% of time was spent in counseling and coordination of care. We have discussed the diagnosis and differential and I answered the patient's questions.    Plan:  Treatment plan and additional workup : Reduce caffeine intake- drink water.  Get the TV out  and daylight blocked from the bedroom, keep quiet , cool ,and dark.  We will get a HST per [facility] , and watch pat preferred.  Patient would like to inquiries, Inspire or dental device. He may still need ENT nasal procedure for airflow.     Larey Seat, MD 74/07/4237, 5:32 AM  Certified in Neurology by ABPN Certified in [place] by [facility] [address], [place], [place] [address]

## 2018-06-17 ENCOUNTER — Other Ambulatory Visit: Payer: Self-pay | Admitting: Gastroenterology

## 2018-06-17 DIAGNOSIS — R05 Cough: Secondary | ICD-10-CM | POA: Diagnosis not present

## 2018-06-18 NOTE — Telephone Encounter (Signed)
Limited refills. Patient needs ov.

## 2018-06-19 ENCOUNTER — Encounter: Payer: Self-pay | Admitting: Gastroenterology

## 2018-06-19 NOTE — Telephone Encounter (Signed)
SCHEDULED AND LETTER SENT  °

## 2018-07-16 ENCOUNTER — Ambulatory Visit (INDEPENDENT_AMBULATORY_CARE_PROVIDER_SITE_OTHER): Payer: 59 | Admitting: Neurology

## 2018-07-16 DIAGNOSIS — G4733 Obstructive sleep apnea (adult) (pediatric): Secondary | ICD-10-CM

## 2018-07-16 DIAGNOSIS — R0683 Snoring: Secondary | ICD-10-CM

## 2018-07-16 DIAGNOSIS — R351 Nocturia: Secondary | ICD-10-CM

## 2018-07-16 DIAGNOSIS — G4710 Hypersomnia, unspecified: Secondary | ICD-10-CM

## 2018-07-16 DIAGNOSIS — R61 Generalized hyperhidrosis: Secondary | ICD-10-CM

## 2018-07-16 DIAGNOSIS — R002 Palpitations: Secondary | ICD-10-CM

## 2018-07-16 DIAGNOSIS — G4734 Idiopathic sleep related nonobstructive alveolar hypoventilation: Secondary | ICD-10-CM

## 2018-07-16 DIAGNOSIS — K219 Gastro-esophageal reflux disease without esophagitis: Secondary | ICD-10-CM

## 2018-07-16 DIAGNOSIS — G4726 Circadian rhythm sleep disorder, shift work type: Secondary | ICD-10-CM

## 2018-07-16 DIAGNOSIS — G4730 Sleep apnea, unspecified: Secondary | ICD-10-CM

## 2018-07-16 DIAGNOSIS — M2629 Other anomalies of dental arch relationship: Secondary | ICD-10-CM

## 2018-07-16 DIAGNOSIS — G4719 Other hypersomnia: Secondary | ICD-10-CM

## 2018-07-16 DIAGNOSIS — R0981 Nasal congestion: Secondary | ICD-10-CM

## 2018-07-23 DIAGNOSIS — G4733 Obstructive sleep apnea (adult) (pediatric): Secondary | ICD-10-CM | POA: Insufficient documentation

## 2018-07-23 DIAGNOSIS — G4734 Idiopathic sleep related nonobstructive alveolar hypoventilation: Secondary | ICD-10-CM

## 2018-07-23 DIAGNOSIS — G4726 Circadian rhythm sleep disorder, shift work type: Secondary | ICD-10-CM | POA: Insufficient documentation

## 2018-07-23 DIAGNOSIS — R61 Generalized hyperhidrosis: Secondary | ICD-10-CM | POA: Insufficient documentation

## 2018-07-23 DIAGNOSIS — M2629 Other anomalies of dental arch relationship: Secondary | ICD-10-CM | POA: Insufficient documentation

## 2018-07-23 DIAGNOSIS — R0981 Nasal congestion: Secondary | ICD-10-CM | POA: Insufficient documentation

## 2018-07-23 NOTE — Addendum Note (Signed)
Addended by: Larey Seat on: 07/23/2018 05:59 PM   Modules accepted: Orders

## 2018-07-23 NOTE — Procedures (Signed)
NAME:  James Blankenship                                                              DOB: 09-01-1965 MEDICAL RECORD No:  [id number]                                    DOS:  07/16/2018 REFERRING PHYSICIAN: Hurshel Party, MD STUDY PERFORMED: Home Sleep Test on Watch Pat HISTORY: [name] is a 53 y.o. male patient and seen here on 06-16-2018 as in a referral from Hurshel Party, MD- [name] is seen here in the presence of his wife. He works shifts- 12 midnight to 8 AM, 6 nights in a row, and one weekly night off.  He has had nasal breathing problems throughout his adult live. He has been diagnosed with nasal septal deviation and a hypo- plastic mandible. He has trouble getting a deep breath. I would like to summarize a visit that [name]. [place] had with Melida Quitter, MD ENT, in 2014-15 where he was told that he may have to have a reconstructive jaw surgery as well as a nasal septum surgery, possibly sinus and turbinate reduction.  He was referred for sleep study which was interpreted by Baird Lyons.  The study was performed on 05 January 2014- the raw data were also available: sleep efficiency was only 76%.The patient only had an AHI of 3.6/h, EKG sinus rhythm with occasional atrial contractions, moderately to loud snoring. He did not have oxygen desaturations. The patient was frequently coughing and trying to clear his airway throughout the night feeling as if he was choking.  There was not enough apnea however noted to qualify him for a CPAP titration.   Epworth Sleepiness score 13- 16/ 24 points, Fatigue severity score: 24/ 63 points- BMI: 29.5 kg/m2.   STUDY RESULTS:  Total Recording Time: 6 h 28 mins; Total Sleep Time: 5 h 20 mins. Total Apnea/Hypopnea Index (AHI):  77.5 /h; RDI: 77.5 /h; and the REM AHI:  62.5/h Average Oxygen Saturation: 92 %; Lowest Oxygen Desaturation: 62 %  Total Time Oxygen Saturation Below or at 88 %:  55.5 minutes  Average Heart Rate: 63 bpm (between 42 and 119  bpm). IMPRESSION: This HST reveals most severe sleep apnea at AHI of 77.5/h. frequent desaturation and prolonged total time below SpO2 of 89% at 55 minutes. Tachy-Bradycardia.  RECOMMENDATION: This degree of sleep apnea warrants immediate intervention. Mr. [name] will need to return either for an attended sleep study or give auto CPAP a trial, I would prefer an in lab titration. He may be a candidate for INSPIRE if his soft palate airway truly collapses and PAP cannot overcome the airflow patency obstacle. ENT would be consulted in that case.   I certify that I have reviewed the raw data recording prior to the issuance of this report in accordance with the standards of the American Academy of Sleep Medicine (AASM). Larey Seat, M.D.   07-23-2018   Medical Director of [facility] at [facility], accredited by the AASM. Diplomat of the ABPN and ABSM. [facility] ([facility]) Diplomat, ABPN (Neurology and Sleep)

## 2018-07-24 ENCOUNTER — Telehealth: Payer: Self-pay | Admitting: Neurology

## 2018-07-24 ENCOUNTER — Telehealth: Payer: Self-pay

## 2018-07-24 ENCOUNTER — Other Ambulatory Visit: Payer: Self-pay | Admitting: Neurology

## 2018-07-24 ENCOUNTER — Encounter: Payer: Self-pay | Admitting: Neurology

## 2018-07-24 DIAGNOSIS — G4730 Sleep apnea, unspecified: Secondary | ICD-10-CM

## 2018-07-24 DIAGNOSIS — G4726 Circadian rhythm sleep disorder, shift work type: Secondary | ICD-10-CM

## 2018-07-24 DIAGNOSIS — I10 Essential (primary) hypertension: Secondary | ICD-10-CM | POA: Diagnosis not present

## 2018-07-24 DIAGNOSIS — R0609 Other forms of dyspnea: Secondary | ICD-10-CM | POA: Diagnosis not present

## 2018-07-24 DIAGNOSIS — G4710 Hypersomnia, unspecified: Secondary | ICD-10-CM

## 2018-07-24 DIAGNOSIS — R0683 Snoring: Secondary | ICD-10-CM

## 2018-07-24 DIAGNOSIS — E291 Testicular hypofunction: Secondary | ICD-10-CM | POA: Diagnosis not present

## 2018-07-24 NOTE — Telephone Encounter (Signed)
-----   Message from Larey Seat, MD sent at 07/23/2018  5:59 PM EST ----- Total Apnea/Hypopnea Index (AHI): 77.5 /h; RDI: 77.5 /h; and the  REM AHI: 62.5/h Average Oxygen Saturation: 92 %; Lowest Oxygen Desaturation: 62 %   Total Time Oxygen Saturation Below or at 88 %: 55.5 minutes  Average Heart Rate: 63 bpm (between 42 and 119 bpm). IMPRESSION: This HST reveals most severe sleep apnea at AHI of  77.5/h. frequent desaturation and prolonged total time below SpO2  of 89% at 55 minutes. Tachy-Bradycardia.  RECOMMENDATION: This degree of sleep apnea warrants immediate  intervention. James Blankenship will need to return either for an  attended sleep study or give auto CPAP a trial, I would prefer an  in lab titration. He may be a candidate for INSPIRE if his soft  palate airway truly collapses and PAP cannot overcome the airflow  patency obstacle. ENT would be consulted in that case.   URGENT _ We can offer a PAP therapy at night or in daytime, and in an adjustable bed , if needed.

## 2018-07-24 NOTE — Telephone Encounter (Signed)
Order placed for a auto cpap 5-15 cm water pressure with mask of choice

## 2018-07-24 NOTE — Telephone Encounter (Signed)
Pt returned call I advised pt that Dr. Brett Fairy reviewed their sleep study results and found that pt has severe sleep apnea. Dr. Brett Fairy recommends that pt starts auto CPAP 5-15 cm water pressure. I reviewed PAP compliance expectations with the pt. Pt is agreeable to starting a CPAP. I advised pt that an order will be sent to a DME, [facility], and [facility] will call the pt within about one week after they file with the pt's insurance. [facility] will show the pt how to use the machine, fit for masks, and troubleshoot the CPAP if needed. A follow up appt was made for insurance [id number] with Dr. Beacher May on March 25,2020 at 8:30 am. Pt verbalized understanding to arrive 15 minutes early and bring their CPAP. A letter with all of this information in it will be mailed to the pt as a reminder. I verified with the pt that the address we have on file is correct. Pt verbalized understanding of results. Pt had no questions at this time but was encouraged to call back if questions arise. I have sent the order to [facility] and have received confirmation that they have received the order.

## 2018-07-24 NOTE — Telephone Encounter (Signed)
Called patient to discuss sleep study results. No answer at this time. LVM for the patient to call back.   

## 2018-07-24 NOTE — Telephone Encounter (Signed)
[employer] will deny cpap titration due to no central apneas on Home study. He will need an order for auto titration first.

## 2018-08-04 NOTE — Telephone Encounter (Signed)
Pt has called to inform that his insurance company has confirmed that [facility] is not in network, and [facility] is the company that should be his DME.  Pt is asking for a call to discuss.  Pt says if not available leave message

## 2018-08-04 NOTE — Telephone Encounter (Signed)
Called to advise the patient that James Blankenship is in his network. Called the patient and advised the patient I would send all the orders over to [name] for him. Patient states that its 100% covered through [place]. I will send everything to [facility]. Advised the patient to call me and inform me on what happens with getting set up through [facility]. Advised the patient that we will complete a ONO after starting the CPAP to assess that his oxygen needs are being met. Pt verbalized understanding.

## 2018-08-11 NOTE — Telephone Encounter (Signed)
James Blankenship is asking for a call from [facility] re: pt's CPAP

## 2018-08-11 NOTE — Telephone Encounter (Signed)
Called her back at [facility] advising this is the home sleep test that I have sent twice and that is what we have always sent in. I don't have anything else to send. She looked over it while on the phone with me.

## 2018-08-11 NOTE — Telephone Encounter (Signed)
[facility] needs complete full copy of the sleep study, not just the brief summary in the office notes.

## 2018-08-11 NOTE — Telephone Encounter (Signed)
Called Christal at [facility] back. She was at lunch, LVM for her to call back. Please see if I can take and if not take a detail message on what is needed. I had sent everything over for the patient to get set up with CPAP twice.

## 2018-08-14 NOTE — Telephone Encounter (Signed)
Called the patient back. He is on the books for apt on 10/01/2018 and he is picking the machine up on 2/19. Advised the patient as long as he has started the machine by 09/30/2018 then he is ok. Pt verbalized understanding.

## 2018-08-14 NOTE — Telephone Encounter (Signed)
Pt is asking for a call back from RN to discuss him now having to wait until 02-17 for his CPAP please call

## 2018-08-27 DIAGNOSIS — G4733 Obstructive sleep apnea (adult) (pediatric): Secondary | ICD-10-CM | POA: Diagnosis not present

## 2018-08-30 ENCOUNTER — Other Ambulatory Visit: Payer: Self-pay | Admitting: Gastroenterology

## 2018-09-04 DIAGNOSIS — R0609 Other forms of dyspnea: Secondary | ICD-10-CM | POA: Diagnosis not present

## 2018-09-04 DIAGNOSIS — E291 Testicular hypofunction: Secondary | ICD-10-CM | POA: Diagnosis not present

## 2018-09-04 DIAGNOSIS — E669 Obesity, unspecified: Secondary | ICD-10-CM | POA: Diagnosis not present

## 2018-09-04 DIAGNOSIS — I10 Essential (primary) hypertension: Secondary | ICD-10-CM | POA: Diagnosis not present

## 2018-09-05 DIAGNOSIS — G4733 Obstructive sleep apnea (adult) (pediatric): Secondary | ICD-10-CM | POA: Diagnosis not present

## 2018-09-09 ENCOUNTER — Telehealth: Payer: Self-pay | Admitting: Neurology

## 2018-09-09 NOTE — Telephone Encounter (Signed)
Pt called and states that he finally received his CPAP machine and yesterday was the first day he used it but he is needing to change the mask or something because he is feeling "suffocated, hot and like its choking him to death". He would like advise on what he can do so that he can start using the machine appropriately. Also pt works second shift and goes in at 3pm so if called after 3 please LVM and he will check phone and call back. Next week he will start first shift. Please advise.

## 2018-09-09 NOTE — Telephone Encounter (Signed)
Patient has had the machine for 13 days. I have printed a download of the 13 days and will have Dr Brett Fairy review this for the patient to see if changes will need to be made. If orders need to be written we will send that to [facility]. Patient can also contact [facility] at [phone] to discuss his concerns related to mask. They may need to complete a mask refit to make sure he is getting a good seal. I will inform Dr Brett Fairy of the CPAP download on my desk for her to review and make aware of any changes.

## 2018-09-10 ENCOUNTER — Other Ambulatory Visit: Payer: Self-pay | Admitting: Neurology

## 2018-09-10 DIAGNOSIS — G4719 Other hypersomnia: Secondary | ICD-10-CM

## 2018-09-10 DIAGNOSIS — G4730 Sleep apnea, unspecified: Secondary | ICD-10-CM

## 2018-09-10 DIAGNOSIS — G4710 Hypersomnia, unspecified: Secondary | ICD-10-CM

## 2018-09-10 DIAGNOSIS — R0683 Snoring: Secondary | ICD-10-CM

## 2018-09-10 DIAGNOSIS — G4726 Circadian rhythm sleep disorder, shift work type: Secondary | ICD-10-CM

## 2018-09-10 NOTE — Telephone Encounter (Signed)
If his download shows that he is not tolerating cpap and you feel he is not be adequately treated we can get authorization for in lab.

## 2018-09-10 NOTE — Telephone Encounter (Signed)
There is a much too high residual AHI for this patient. Any chance we get him in for a full titration, may be needing BiPAP?

## 2018-09-10 NOTE — Telephone Encounter (Signed)
Called the patient, no answer. LVM informing the patient that I had Dr Dohmeier review the 13 days worth of the data on the machine. Informed that it appers he is truly having apnea events as well as leaks with the machine. Originally Dr Dohmeier wanted to bring the patient in for CPAP titration to have the patient properly titrated on the CPAP or advance to Bipap if needed. This 13 day data shows the patient is failing the auto CPAP and now insurance will cover to bring the patient in for titration study. Instructed  the patient on voicemail that I have ordered the sleep study and they will now get insurance to approve and then they will contact him to get him scheduled to come in and complete the sleep study so that we can get him on the proper machine to help with treating him. Informed the patient to call back with results.

## 2018-09-11 NOTE — Telephone Encounter (Signed)
Patient returned the call and I was able to review the recommendation by Dr Brett Fairy. Advised the patient that now that he has failed using the auto CPAP this will allow [place] to bring him in for CPAP-bipap titration. Pt verbalized understanding. Patient will wait to hear from the sleep lab once they get insurance approval to get him scheduled. Patient wanted to let them sleep lab know that Friday nights will be the best night for him since it is so hard for him to get off work. Advised the patient I will pass that information along.

## 2018-09-23 ENCOUNTER — Ambulatory Visit: Payer: Commercial Managed Care - HMO | Admitting: Gastroenterology

## 2018-09-25 DIAGNOSIS — G4733 Obstructive sleep apnea (adult) (pediatric): Secondary | ICD-10-CM | POA: Diagnosis not present

## 2018-10-01 ENCOUNTER — Ambulatory Visit: Payer: Self-pay | Admitting: Neurology

## 2018-10-23 DIAGNOSIS — J209 Acute bronchitis, unspecified: Secondary | ICD-10-CM | POA: Diagnosis not present

## 2018-10-26 DIAGNOSIS — G4733 Obstructive sleep apnea (adult) (pediatric): Secondary | ICD-10-CM | POA: Diagnosis not present

## 2018-10-28 ENCOUNTER — Ambulatory Visit (INDEPENDENT_AMBULATORY_CARE_PROVIDER_SITE_OTHER): Payer: Commercial Managed Care - HMO | Admitting: Internal Medicine

## 2018-10-28 DIAGNOSIS — E559 Vitamin D deficiency, unspecified: Secondary | ICD-10-CM | POA: Diagnosis not present

## 2018-10-28 DIAGNOSIS — E291 Testicular hypofunction: Secondary | ICD-10-CM | POA: Diagnosis not present

## 2018-10-28 DIAGNOSIS — J209 Acute bronchitis, unspecified: Secondary | ICD-10-CM | POA: Diagnosis not present

## 2018-11-25 DIAGNOSIS — G4733 Obstructive sleep apnea (adult) (pediatric): Secondary | ICD-10-CM | POA: Diagnosis not present

## 2018-11-26 ENCOUNTER — Other Ambulatory Visit: Payer: Self-pay

## 2018-11-26 ENCOUNTER — Telehealth: Payer: Self-pay | Admitting: *Deleted

## 2018-11-26 ENCOUNTER — Ambulatory Visit: Payer: 59 | Admitting: Nurse Practitioner

## 2018-11-26 NOTE — Telephone Encounter (Signed)
Called patient for virtual visit with EG today. I LMTCB and did not receive a return phone call.

## 2018-11-26 NOTE — Progress Notes (Signed)
Patient was a no-show 

## 2018-11-27 ENCOUNTER — Telehealth: Payer: Self-pay | Admitting: Gastroenterology

## 2018-11-27 ENCOUNTER — Encounter: Payer: Self-pay | Admitting: Gastroenterology

## 2018-11-27 NOTE — Telephone Encounter (Signed)
PATIENT WAS A NO SHOW/NO ANSWER AND LETTER SENT  °

## 2018-12-03 NOTE — Telephone Encounter (Signed)
NO SHOWED AND LETTER SENT  °

## 2019-01-19 ENCOUNTER — Institutional Professional Consult (permissible substitution): Payer: 59 | Admitting: Pulmonary Disease

## 2019-01-20 ENCOUNTER — Ambulatory Visit: Payer: 59 | Admitting: Pulmonary Disease

## 2019-01-20 ENCOUNTER — Encounter: Payer: Self-pay | Admitting: Pulmonary Disease

## 2019-01-20 ENCOUNTER — Other Ambulatory Visit: Payer: Self-pay

## 2019-01-20 ENCOUNTER — Ambulatory Visit (INDEPENDENT_AMBULATORY_CARE_PROVIDER_SITE_OTHER): Payer: 59

## 2019-01-20 VITALS — BP 130/76 | HR 86 | Temp 98.2°F | Ht 73.0 in | Wt 215.6 lb

## 2019-01-20 DIAGNOSIS — R0602 Shortness of breath: Secondary | ICD-10-CM | POA: Diagnosis not present

## 2019-01-20 DIAGNOSIS — G4733 Obstructive sleep apnea (adult) (pediatric): Secondary | ICD-10-CM | POA: Diagnosis not present

## 2019-01-20 NOTE — Patient Instructions (Signed)
Obstructive sleep apnea Shortness of breath with normal spirometry in the past  In lab polysomnogram -We will order a split-night study, diagnosis and treatment on the same night  We will obtain a chest x-[place] Obtain a full pulmonary function study  We will see you back in about 6 weeks  Call with significant concerns

## 2019-01-20 NOTE — Progress Notes (Signed)
Subjective:    Patient ID: James Blankenship, male    DOB: October 14, 1965, 53 y.o.   MRN: [id number]  Patient being seen for obstructive sleep apnea  He had a diagnostic study performed in January 2020 revealing severe obstructive sleep apnea He stated he had a CPAP machine which he used for about 2 weeks When the data was reviewed for his compliance-it was felt that it was not working as well as expected He continues to struggle with the mask and interface  He did have a discussion about having an inspire device placed  Admits to severe snoring This is a Pedigo bit better with having a bed that elevates, sleeps semirecumbent Feels the snoring is less, sleep quality does appear less as well  He remains very sleepy during the day  Admits to dry mouth in the mornings Headaches Used to wake up a lot more during the night but this is better as well  His dad snored  He did have a spirometry performed earlier in the year for shortness of breath     Review of Systems  Constitutional: Negative for fever and unexpected weight change.  HENT: Negative for congestion, dental problem, ear pain, nosebleeds, postnasal drip, rhinorrhea, sinus pressure, sneezing, sore throat and trouble swallowing.   Eyes: Negative for redness and itching.  Respiratory: Positive for cough and shortness of breath. Negative for chest tightness and wheezing.   Cardiovascular: Negative for palpitations and leg swelling.  Gastrointestinal: Negative for nausea and vomiting.  Genitourinary: Negative for dysuria.  Musculoskeletal: Negative for joint swelling.  Skin: Negative for rash.  Allergic/Immunologic: Positive for environmental allergies. Negative for food allergies and immunocompromised state.  Neurological: Positive for headaches.  Hematological: Does not bruise/bleed easily.  Psychiatric/Behavioral: Negative for dysphoric mood. The patient is not nervous/anxious.    Past Medical History:  Diagnosis Date  .  Gastritis   . GERD (gastroesophageal reflux disease)    Social History   Socioeconomic History  . Marital status: Divorced    Spouse name: Not on file  . Number of children: 2  . Years of education: Not on file  . Highest education level: Not on file  Occupational History    Employer: [employer]  . Financial resource strain: Not on file  . Food insecurity    Worry: Not on file    Inability: Not on file  . Transportation needs    Medical: Not on file    Non-medical: Not on file  Tobacco Use  . Smoking status: Never Smoker  . Smokeless tobacco: Never Used  Substance and Sexual Activity  . Alcohol use: No    Alcohol/week: 0.0 standard drinks  . Drug use: No  . Sexual activity: Yes    Birth control/protection: Surgical  Lifestyle  . Physical activity    Days per week: Not on file    Minutes per session: Not on file  . Stress: Not on file  Relationships  . Social Herbalist on phone: Not on file    Gets together: Not on file    Attends religious service: Not on file    Active member of club or organization: Not on file    Attends meetings of clubs or organizations: Not on file    Relationship status: Not on file  . Intimate partner violence    Fear of current or ex partner: Not on file    Emotionally abused: Not on file    Physically  abused: Not on file    Forced sexual activity: Not on file  Other Topics Concern  . Not on file  Social History Narrative  . Not on file   Family History  Problem Relation Age of Onset  . Cancer - Other Father        mesothelioma, deceased at age 26   . Colon cancer Neg Hx        Objective:   Physical Exam Constitutional:      Appearance: Normal appearance.  HENT:     Head: Normocephalic and atraumatic.  Neck:     Musculoskeletal: Normal range of motion and neck supple. No neck rigidity or muscular tenderness.  Cardiovascular:     Rate and Rhythm: Normal rate and regular rhythm.     Pulses:  Normal pulses.     Heart sounds: No murmur. No friction rub.  Pulmonary:     Effort: Pulmonary effort is normal. No respiratory distress.     Breath sounds: No stridor. No wheezing.  Abdominal:     General: There is no distension.     Palpations: There is no mass.  Musculoskeletal: Normal range of motion.        General: No swelling.  Skin:    General: Skin is warm.  Neurological:     General: No focal deficit present.     Mental Status: He is alert and oriented to person, place, and time.  Psychiatric:        Mood and Affect: Mood normal.    Vitals:   01/20/19 1623  BP: 130/76  Pulse: 86  Temp: 98.2 F (36.8 C)  SpO2: 96%    Home sleep study from January 2020 did reveal severe obstructive sleep apnea   Assessment & Plan:  .  History of severe obstructive sleep apnea -Did not tolerate CPAP -Difficulty with the mask -Discussion had been along the lines of a titration study versus an inspire device  I believe he would benefit from having a titration study performed-plan will be to titrate to CPAP and BiPAP if needed -He will be fitted with different masks to see what fits the best If he is not able to tolerate CPAP or will not able to show that CPAP does decrease his AHI-he will be a candidate for the inspire At which point a referral can be made   For his shortness of breath-obtain a chest x-[place], obtain full pulmonary function study We will consider an echocardiogram although does not have any history of heart disease  We will follow-up in about 6 weeks  Encouraged to call with any significant concerns

## 2019-01-22 ENCOUNTER — Other Ambulatory Visit (HOSPITAL_BASED_OUTPATIENT_CLINIC_OR_DEPARTMENT_OTHER): Payer: Self-pay

## 2019-01-29 ENCOUNTER — Ambulatory Visit: Payer: 59

## 2019-02-03 ENCOUNTER — Telehealth: Payer: Self-pay

## 2019-02-03 NOTE — Telephone Encounter (Signed)
We have attempted to call the patient three times to schedule CPAP study.  Patient has been unavailable at the phone numbers we have on file and has not returned our calls.    If patient calls back we will schedule them for their sleep study.

## 2019-03-23 ENCOUNTER — Ambulatory Visit (INDEPENDENT_AMBULATORY_CARE_PROVIDER_SITE_OTHER): Payer: 59 | Admitting: Internal Medicine

## 2019-03-23 ENCOUNTER — Encounter (INDEPENDENT_AMBULATORY_CARE_PROVIDER_SITE_OTHER): Payer: Self-pay | Admitting: Internal Medicine

## 2019-03-23 ENCOUNTER — Other Ambulatory Visit: Payer: Self-pay

## 2019-03-23 VITALS — BP 140/80 | HR 72 | Ht 73.0 in | Wt 217.0 lb

## 2019-03-23 DIAGNOSIS — E291 Testicular hypofunction: Secondary | ICD-10-CM

## 2019-03-23 DIAGNOSIS — Z0001 Encounter for general adult medical examination with abnormal findings: Secondary | ICD-10-CM

## 2019-03-23 DIAGNOSIS — G4733 Obstructive sleep apnea (adult) (pediatric): Secondary | ICD-10-CM

## 2019-03-23 DIAGNOSIS — Z13220 Encounter for screening for lipoid disorders: Secondary | ICD-10-CM

## 2019-03-23 DIAGNOSIS — K219 Gastro-esophageal reflux disease without esophagitis: Secondary | ICD-10-CM

## 2019-03-23 DIAGNOSIS — E559 Vitamin D deficiency, unspecified: Secondary | ICD-10-CM | POA: Diagnosis not present

## 2019-03-23 DIAGNOSIS — Z131 Encounter for screening for diabetes mellitus: Secondary | ICD-10-CM

## 2019-03-23 DIAGNOSIS — Z114 Encounter for screening for human immunodeficiency virus [HIV]: Secondary | ICD-10-CM

## 2019-03-23 DIAGNOSIS — R5383 Other fatigue: Secondary | ICD-10-CM

## 2019-03-23 DIAGNOSIS — Z0000 Encounter for general adult medical examination without abnormal findings: Secondary | ICD-10-CM

## 2019-03-23 DIAGNOSIS — G4730 Sleep apnea, unspecified: Secondary | ICD-10-CM | POA: Insufficient documentation

## 2019-03-23 DIAGNOSIS — R5381 Other malaise: Secondary | ICD-10-CM

## 2019-03-23 DIAGNOSIS — Z125 Encounter for screening for malignant neoplasm of prostate: Secondary | ICD-10-CM

## 2019-03-23 HISTORY — DX: Testicular hypofunction: E29.1

## 2019-03-23 HISTORY — DX: Vitamin D deficiency, unspecified: E55.9

## 2019-03-23 NOTE — Progress Notes (Signed)
Chief Complaint: This 53 year old man comes in for an annual physical exam and to address his chronic conditions which are described below. HPI: He has been diagnosed with sleep apnea and sees a neurologist for this.  He tells me that the neurologist had called him to say that the CPAP machine was not going to be of much benefit to him and wanted him to come in for further studies using BiPAP machine.  Unfortunately, because of his work, he has not been able to do so so far. He continues to take testosterone therapy twice a week for hypogonadism and overall he thinks that this is benefited him although he wonders whether his doses can be increased as he still tends to feel fatigued.  This also may be related to the amount of hours he is working in his regular job at the present time. He also has gastroesophageal reflux disease for which he takes medication. He also has vitamin D deficiency for which he takes supplementation. He denies any chest pain, dyspnea or palpitations.  Past Medical History:  Diagnosis Date  . Gastritis   . GERD (gastroesophageal reflux disease)   . Male hypogonadism 03/23/2019  . Sleep apnea   . Vitamin D deficiency disease 03/23/2019   Past Surgical History:  Procedure Laterality Date  . BIOPSY  11/01/2015   Procedure: BIOPSY;  Surgeon: Danie Binder, MD;  Location: [facility];  Service: Endoscopy;;  gastric  . CHOLECYSTECTOMY    . COLONOSCOPY WITH PROPOFOL N/A 11/01/2015   Dr. Oneida Alar: internal hemorrhoids, otherwise normal.   . ESOPHAGOGASTRODUODENOSCOPY  06/10/2009   Distal esophagea stricture dialted to 13mm. Bx from distal esophagus (neg). erythema in body and antrum.  . ESOPHAGOGASTRODUODENOSCOPY  05/30/2009   distal peptic stricture dialted to 38mm, diffuse bx proven gastritis, no .hpylori  . ESOPHAGOGASTRODUODENOSCOPY (EGD) WITH PROPOFOL N/A 11/01/2015   Dr. Oneida Alar: benign-appearing esophageal stenosis s/p dilation, chronic gastritis   . HERNIA REPAIR  Left    groin  . SAVORY DILATION N/A 11/01/2015   Procedure: SAVORY DILATION;  Surgeon: Danie Binder, MD;  Location: [facility];  Service: Endoscopy;  Laterality: N/A;  . VASECTOMY       Social History   Social History Narrative   Married since 2019,lives with wife and extended family.Works at [employer].        Allergies:   Penicillin Current Meds  Medication Sig  . acetaminophen (TYLENOL) 500 MG tablet Take 1,000 mg by mouth every 6 (six) hours as needed (pain). Reported on 09/28/2015  . Cholecalciferol (VITAMIN D-3) 125 MCG (5000 UT) TABS Take 10,000 Units by mouth daily at 12 noon.  . pantoprazole (PROTONIX) 40 MG tablet TAKE 1 TABLET BY MOUTH EACH MORNING 30 MINUTES PRIOR TO BREAKFAST.  Marland Kitchen Testosterone Cypionate 200 MG/ML SOLN Inject 100 mg as directed. Twice a week     Nutrition Unfortunately, he still continues to drink soft drinks throughout his working day and this is impeding his weight loss and health.  Sleep Has sleep apnea.  He tends to sleep better reclining and feels that he is refreshed the following morning when he does this.  Exercise None regular   Bio-identical hormones Testosterone therapy is being used off label for symptoms of testosterone deficiency and benefits that it produces based on several studies.  These benefits include decreasing body fat, increasing in lean muscle mass and increasing in bone density.  There is improvement of memory, cognition.  There is improvement in exercise tolerance  and endurance.  Testosterone therapy has also been shown to be protective against coronary artery disease, cerebrovascular disease, diabetes, hypertension and degenerative joint disease. I have discussed with the patient the FDA warnings regarding testosterone therapy, benefits and side effects and modes of administration as well as monitoring blood levels and side effects  on a regular basis The patient is agreeable that testosterone therapy should be  an integral part of his/her wellness,quality of life and prevention of chronic disease.  [id number] from the symptoms mentioned above,there are no other symptoms referable to all systems reviewed.  Physical Exam: Blood pressure 140/80, pulse 72, height 6\' 1"  (1.854 m), weight 217 lb (98.4 kg). Vitals with BMI 03/23/2019 01/20/2019 06/16/2018  Height 6\' 1"  6\' 1"  6\' 1"   Weight 217 lbs 215 lbs 10 oz 223 lbs  BMI 28.64 [id number]  Systolic [id number]  Diastolic 80 76 91  Pulse 72 86 81      He looks systemically well and not in any acute distress. General: Alert, cooperative, and appears to be the stated age.No pallor.  No jaundice.  No clubbing. Head: Normocephalic Eyes: Sclera white, pupils equal and reactive to light, red reflex x 2,  Ears: Normal bilaterally Oral cavity: Lips, mucosa, and tongue normal: Teeth and gums normal Neck: No adenopathy, supple, symmetrical, trachea midline, and thyroid does not appear enlarged Respiratory: Clear to auscultation bilaterally.No wheezing, crackles or bronchial breathing. Cardiovascular: Heart sounds are present and appear to be normal without murmurs or added sounds.  No carotid bruits.  Peripheral pulses are present and equal bilaterally.: Soft, nontender, Gastrointestinal:positive bowel sounds, no hepatosplenomegaly.  No masses felt.No tenderness. Skin: Clear, No rashes noted.No worrisome skin lesions seen. Neurological: Grossly intact without focal findings, cranial nerves II through XII intact, muscle strength equal bilaterally Musculoskeletal: No acute joint abnormalities noted.Full range of movement noted with joints. Psychiatric: Affect appropriate, non-anxious.    Assessment  1. Gastroesophageal reflux disease without esophagitis   2. Severe obstructive sleep apnea   3. Examination for, medical, general   4. Vitamin D deficiency disease   5. Male hypogonadism   6. Malaise and fatigue   7. Screening for diabetes mellitus   8.  Encounter for screening for human immunodeficiency virus (HIV)   9. Screening for lipoid disorders   10. Special screening for malignant neoplasm of prostate   11. Sleep apnea, unspecified type       Plan  1. He will continue with all medications for his chronic conditions above. 2. Blood work was done today as outlined below. 3. I stressed to him the importance of losing weight and gave him a handout for nutritional guidelines that we have discussed previously, including intermittent fasting. 4. Further recommendations will depend on blood results and I will follow-up with him in about 3 months time. 5. Today, in addition to a preventative visit, I performed an office visit to address his chronic conditions above.      Tests Ordered:   Orders Placed This Encounter  Procedures  . CBC  . COMPLETE METABOLIC PANEL WITH GFR  . Hemoglobin A1c  . HIV Antibody (routine testing w rflx)  . Lipid panel  . PSA  . T3, free  . TSH  . Testosterone Total,Free,Bio, Males  . VITAMIN D 25 Hydroxy (Vit-D Deficiency, Fractures)     Nimish C Gosrani   03/23/2019, 5:05 PM

## 2019-03-23 NOTE — Patient Instructions (Signed)

## 2019-03-24 LAB — COMPLETE METABOLIC PANEL WITH GFR
AG Ratio: 1.6 (calc) (ref 1.0–2.5)
ALT: 19 U/L (ref 9–46)
AST: 19 U/L (ref 10–35)
Albumin: 4.5 g/dL (ref 3.6–5.1)
Alkaline phosphatase (APISO): 54 U/L (ref 35–144)
BUN/Creatinine Ratio: 18 (calc) (ref 6–22)
BUN: 27 mg/dL — ABNORMAL HIGH (ref 7–25)
CO2: 24 mmol/L (ref 20–32)
Calcium: 9.5 mg/dL (ref 8.6–10.3)
Chloride: 104 mmol/L (ref 98–110)
Creat: 1.53 mg/dL — ABNORMAL HIGH (ref 0.70–1.33)
GFR, Est African American: 59 mL/min/{1.73_m2} — ABNORMAL LOW (ref >=60)
GFR, Est Non African American: 51 mL/min/{1.73_m2} — ABNORMAL LOW (ref >=60)
Globulin: 2.9 g/dL (calc) (ref 1.9–3.7)
Glucose, Bld: 93 mg/dL (ref 65–99)
Potassium: 4.1 mmol/L (ref 3.5–5.3)
Sodium: 139 mmol/L (ref 135–146)
Total Bilirubin: 0.5 mg/dL (ref 0.2–1.2)
Total Protein: 7.4 g/dL (ref 6.1–8.1)

## 2019-03-24 LAB — CBC
HCT: 46.4 % (ref 38.5–50.0)
Hemoglobin: 16 g/dL (ref 13.2–17.1)
MCH: 29 pg (ref 27.0–33.0)
MCHC: 34.5 g/dL (ref 32.0–36.0)
MCV: 84.2 fL (ref 80.0–100.0)
MPV: 10.3 fL (ref 7.5–12.5)
Platelets: 240 10*3/uL (ref 140–400)
RBC: 5.51 10*6/uL (ref 4.20–5.80)
RDW: 14.9 % (ref 11.0–15.0)
WBC: 6.1 10*3/uL (ref 3.8–10.8)

## 2019-03-24 LAB — LIPID PANEL
Cholesterol: 180 mg/dL (ref <200)
HDL: 38 mg/dL — ABNORMAL LOW (ref >=40)
LDL Cholesterol (Calc): 117 mg/dL (calc) — ABNORMAL HIGH
Non-HDL Cholesterol (Calc): 142 mg/dL (calc) — ABNORMAL HIGH (ref <130)
Total CHOL/HDL Ratio: 4.7 (calc) (ref <5.0)
Triglycerides: 132 mg/dL (ref <150)

## 2019-03-24 LAB — HEMOGLOBIN A1C
Hgb A1c MFr Bld: 5.8 % of total Hgb — ABNORMAL HIGH (ref <5.7)
Mean Plasma Glucose: 120 (calc)
eAG (mmol/L): 6.6 (calc)

## 2019-03-24 LAB — TESTOSTERONE TOTAL,FREE,BIO, MALES
Albumin: 4.5 g/dL (ref 3.6–5.1)
Sex Hormone Binding: 14 nmol/L (ref 10–50)
Testosterone, Bioavailable: 353.5 ng/dL (ref >=110.0)
Testosterone, Free: 171.9 pg/mL (ref 46.0–224.0)
Testosterone: 655 ng/dL (ref 250–827)

## 2019-03-24 LAB — T3, FREE: T3, Free: 3.6 pg/mL (ref 2.3–4.2)

## 2019-03-24 LAB — PSA: PSA: 0.8 ng/mL (ref <=4.0)

## 2019-03-24 LAB — TSH: TSH: 2.65 mIU/L (ref 0.40–4.50)

## 2019-03-24 LAB — VITAMIN D 25 HYDROXY (VIT D DEFICIENCY, FRACTURES): Vit D, 25-Hydroxy: 86 ng/mL (ref 30–100)

## 2019-03-24 LAB — HIV ANTIBODY (ROUTINE TESTING W REFLEX): HIV 1&2 Ab, 4th Generation: NONREACTIVE

## 2019-03-24 NOTE — Progress Notes (Signed)
HIV test is negative and testosterone levels are reasonable.  We can discuss next visit whether we should increase the dose of testosterone.

## 2019-03-24 NOTE — Progress Notes (Signed)
Please see your results.  I am concerned about worsening kidney function.  Please make sure that you drink plenty of water every single day.  Focus on the nutrition that we discussed, avoid soda drinks altogether.  If you have any questions, do not hesitate to contact me.  Be well!

## 2019-03-31 ENCOUNTER — Telehealth: Payer: Self-pay | Admitting: Pulmonary Disease

## 2019-04-01 NOTE — Telephone Encounter (Signed)
L/m on pt vm to call back to schedule pft -pr  °

## 2019-04-02 ENCOUNTER — Telehealth: Payer: Self-pay | Admitting: Pulmonary Disease

## 2019-04-02 NOTE — Telephone Encounter (Signed)
noted 

## 2019-04-02 NOTE — Telephone Encounter (Signed)
L/m on pt vm to call back to schedule pft - 3rd attempt - closed encounter -pr  °

## 2019-04-08 ENCOUNTER — Encounter (INDEPENDENT_AMBULATORY_CARE_PROVIDER_SITE_OTHER): Payer: 59 | Admitting: Internal Medicine

## 2019-04-22 ENCOUNTER — Other Ambulatory Visit (INDEPENDENT_AMBULATORY_CARE_PROVIDER_SITE_OTHER): Payer: Self-pay | Admitting: Internal Medicine

## 2019-04-22 MED ORDER — TESTOSTERONE CYPIONATE 200 MG/ML IJ SOLN: 100.0000 mg | INTRAMUSCULAR | 1 refills | Status: DC

## 2019-06-30 ENCOUNTER — Other Ambulatory Visit (INDEPENDENT_AMBULATORY_CARE_PROVIDER_SITE_OTHER): Payer: Self-pay | Admitting: Internal Medicine

## 2019-06-30 ENCOUNTER — Ambulatory Visit (INDEPENDENT_AMBULATORY_CARE_PROVIDER_SITE_OTHER): Payer: 59 | Admitting: Internal Medicine

## 2019-06-30 ENCOUNTER — Other Ambulatory Visit: Payer: Self-pay

## 2019-06-30 ENCOUNTER — Encounter (INDEPENDENT_AMBULATORY_CARE_PROVIDER_SITE_OTHER): Payer: Self-pay | Admitting: Internal Medicine

## 2019-06-30 VITALS — BP 120/80 | HR 89 | Temp 98.2°F | Resp 18 | Ht 73.0 in | Wt 214.0 lb

## 2019-06-30 DIAGNOSIS — E291 Testicular hypofunction: Secondary | ICD-10-CM | POA: Diagnosis not present

## 2019-06-30 DIAGNOSIS — E559 Vitamin D deficiency, unspecified: Secondary | ICD-10-CM

## 2019-06-30 DIAGNOSIS — R7303 Prediabetes: Secondary | ICD-10-CM

## 2019-06-30 DIAGNOSIS — K219 Gastro-esophageal reflux disease without esophagitis: Secondary | ICD-10-CM

## 2019-06-30 NOTE — Patient Instructions (Signed)
[facility] Dietary Recommendations for Weight Loss What to Avoid . Avoid added sugars o Often added sugar can be found in processed foods such as many condiments, dry cereals, cakes, cookies, chips, crisps, crackers, candies, sweetened drinks, etc.  o Read labels and AVOID/DECREASE use of foods with the following in their ingredient list: Sugar, fructose, high fructose corn syrup, sucrose, glucose, maltose, dextrose, molasses, cane sugar, brown sugar, any type of syrup, agave nectar, etc.   . Avoid snacking in between meals . Avoid foods made with flour o If you are going to eat food made with flour, choose those made with whole-grains; and, minimize your consumption as much as is tolerable . Avoid processed foods o These foods are generally stocked in the middle of the grocery store. Focus on shopping on the perimeter of the grocery.  . Avoid Meat  o We recommend following a plant-based diet at [facility]. Thus, we recommend avoiding meat as a general rule. Consider eating beans, legumes, eggs, and/or dairy products for regular protein sources o If you plan on eating meat limit to 4 ounces of meat at a time and choose lean options such as Fish, chicken, turkey. Avoid red meat intake such as pork and/or steak What to Include . Vegetables o GREEN LEAFY VEGETABLES: Kale, spinach, mustard greens, collard greens, cabbage, broccoli, etc. o OTHER: Asparagus, cauliflower, eggplant, carrots, peas, Brussel sprouts, tomatoes, bell peppers, zucchini, beets, cucumbers, etc. . Grains, seeds, and legumes o Beans: kidney beans, black eyed peas, garbanzo beans, black beans, pinto beans, etc. o Whole, unrefined grains: brown rice, barley, bulgur, oatmeal, etc. . Healthy fats  o Avoid highly processed fats such as vegetable oil o Examples of healthy fats: avocado, olives, virgin olive oil, dark chocolate (?72% Cocoa), nuts (peanuts, almonds, walnuts, cashews, pecans, etc.) . None to Low  Intake of Animal Sources of Protein o Meat sources: chicken, turkey, salmon, tuna. Limit to 4 ounces of meat at one time. o Consider limiting dairy sources, but when choosing dairy focus on: PLAIN Greek yogurt, cottage cheese, high-protein milk . Fruit o Choose berries  When to Eat . Intermittent Fasting: o Choosing not to eat for a specific time period, but DO FOCUS ON HYDRATION when fasting o Multiple Techniques: - Time Restricted Eating: eat 3 meals in a day, each meal lasting no more than 60 minutes, no snacks between meals - 16-18 hour fast: fast for 16 to 18 hours up to 7 days a week. Often suggested to start with 2-3 nonconsecutive days per week.  . Remember the time you sleep is counted as fasting.  . Examples of eating schedule: Fast from 7:00pm-11:00am. Eat between 11:00am-7:00pm.  - 24-hour fast: fast for 24 hours up to every other day. Often suggested to start with 1 day per week . Remember the time you sleep is counted as fasting . Examples of eating schedule:  o Eating day: eat 2-3 meals on your eating day. If doing 2 meals, each meal should last no more than 90 minutes. If doing 3 meals, each meal should last no more than 60 minutes. Finish last meal by 7:00pm. o Fasting day: Fast until 7:00pm.  o IF YOU FEEL UNWELL FOR ANY REASON/IN ANY WAY WHEN FASTING, STOP FASTING BY EATING A NUTRITIOUS SNACK OR LIGHT MEAL o ALWAYS FOCUS ON HYDRATION DURING FASTS - Acceptable Hydration sources: water, broths, tea/coffee (black tea/coffee is best but using a small amount of whole-fat dairy products in coffee/tea is acceptable).  -   Poor Hydration Sources: anything with sugar or artificial sweeteners added to it  These recommendations have been developed for patients that are actively receiving medical care from either Dr. Coraline Blankenship or James Gray, DNP, NP-C at [facility]. These recommendations are developed for patients with specific medical conditions and are not meant to be  distributed or used by others that are not actively receiving care from either provider listed above at [facility]. It is not appropriate to participate in the above eating plans without proper medical supervision.   Reference: Fung, J. The obesity code. [place]: [place]; 2016.   

## 2019-06-30 NOTE — Progress Notes (Signed)
Metrics: Intervention Frequency ACO  Documented Smoking Status Yearly  Screened one or more times in 24 months  Cessation Counseling or  Active cessation medication Past 24 months  Past 24 months   Guideline developer: UpToDate (See UpToDate for funding source) Date Released: 2014       Wellness Office Visit  Subjective:  Patient ID: James Blankenship, male    DOB: [birth year]  Age: 53 y.o. MRN: [id number]  CC: This man comes in for follow-up of hypogonadism, obesity, prediabetes, gastroesophageal reflux disease and vitamin D deficiency. HPI He is doing reasonably well but he has had difficulty with nutrition.  Unfortunately, he likes to drink Coke 0, which is an improvement from regular Coke.  His last hemoglobin A1c was 5.8% and we are trying to avoid him becoming diabetic. Testosterone therapy has been continued and his levels are now still optimal based on the reading last time. I was concerned about his state of kidney function on the last visit and I did message him to drink more water and keep better hydrated so I hope he has done this. He continues on PPI for his gastroesophageal reflux disease symptoms. He also needs to follow-up regarding his sleep apnea which he has not done so at the present time because of his work schedule.  Past Medical History:  Diagnosis Date  . Gastritis   . GERD (gastroesophageal reflux disease)   . Male hypogonadism 03/23/2019  . Sleep apnea   . Vitamin D deficiency disease 03/23/2019      Family History  Problem Relation Age of Onset  . Cancer - Other Father        mesothelioma, deceased at age 60   . Colon cancer Neg Hx     Social History   Social History Narrative   Married since 2019,lives with wife and extended family.Works at [employer].   Social History   Tobacco Use  . Smoking status: Never Smoker  . Smokeless tobacco: Never Used  Substance Use Topics  . Alcohol use: No    Alcohol/week: 0.0 standard drinks    Current  Meds  Medication Sig  . Cholecalciferol (VITAMIN D-3) 125 MCG (5000 UT) TABS Take 10,000 Units by mouth daily at 12 noon.  . pantoprazole (PROTONIX) 40 MG tablet TAKE 1 TABLET BY MOUTH EACH MORNING 30 MINUTES PRIOR TO BREAKFAST.  Marland Kitchen Testosterone Cypionate 200 MG/ML SOLN Inject 100 mg as directed 2 (two) times a week. Twice a week  . [DISCONTINUED] acetaminophen (TYLENOL) 500 MG tablet Take 1,000 mg by mouth every 6 (six) hours as needed (pain). Reported on 09/28/2015  . [DISCONTINUED] VITAMIN D PO Take 125 mcg by mouth daily.     Bio Identical Hormones  Testosterone therapy is being used off label for symptoms of testosterone deficiency and benefits that it produces based on several studies.  These benefits include decreasing body fat, increasing in lean muscle mass and increasing in bone density.  There is improvement of memory, cognition.  There is improvement in exercise tolerance and endurance.  Testosterone therapy has also been shown to be protective against coronary artery disease, cerebrovascular disease, diabetes, hypertension and degenerative joint disease. I have discussed with the patient the FDA warnings regarding testosterone therapy, benefits and side effects and modes of administration as well as monitoring blood levels and side effects  on a regular basis The patient is agreeable that testosterone therapy should be an integral part of his/her wellness,quality of life and prevention of chronic disease.  Objective:  Today's Vitals: BP 120/80 (BP Location: Left Arm, Patient Position: Sitting, Cuff Size: Normal)   Pulse 89   Temp 98.2 F (36.8 C) (Temporal)   Resp 18   Ht 6' 1"  (1.854 m)   Wt 214 lb (97.1 kg)   BMI 28.23 kg/m  Vitals with BMI 06/30/2019 03/23/2019 01/20/2019  Height 6' 1"  6' 1"  6' 1"   Weight 214 lbs 217 lbs 215 lbs 10 oz  BMI 28.24 05.10 71.25  Systolic 247 998 001  Diastolic 80 80 76  Pulse 89 72 86     Physical Exam   He looks systemically well.  He  has lost about 3 pounds since the last visit.  Blood pressure is well controlled.  He is alert and orientated without any focal neurological signs.    Assessment   1. Gastroesophageal reflux disease without esophagitis   2. Male hypogonadism   3. Vitamin D deficiency disease   4. Prediabetes       Tests ordered Orders Placed This Encounter  Procedures  . CMP with eGFR([facility])  . Hemoglobin A1c     Plan: 1. Blood work is ordered as above. 2. He will continue with Protonix for his gastroesophageal reflux disease which seems to keep him stable. 3. He will continue with testosterone therapy but I think we can afford to increase the dose I have told him to go to 0.6 mL or 120 mg twice a week.  I will check levels next time. 4. He will continue with vitamin D3 supplementation as before for vitamin D deficiency. 5. He needs to focus on his nutrition and I have given him a handout regarding mostly a plant-based diet which I think will help him tremendously. 6. Further recommendations will depend on blood results and I will see him for follow-up in about 3 months.   No orders of the defined types were placed in this encounter.   Doree Albee, MD

## 2019-07-01 LAB — COMPLETE METABOLIC PANEL WITH GFR
AG Ratio: 1.6 (calc) (ref 1.0–2.5)
ALT: 23 U/L (ref 9–46)
AST: 21 U/L (ref 10–35)
Albumin: 4.3 g/dL (ref 3.6–5.1)
Alkaline phosphatase (APISO): 51 U/L (ref 35–144)
BUN/Creatinine Ratio: 13 (calc) (ref 6–22)
BUN: 19 mg/dL (ref 7–25)
CO2: 28 mmol/L (ref 20–32)
Calcium: 9.7 mg/dL (ref 8.6–10.3)
Chloride: 106 mmol/L (ref 98–110)
Creat: 1.51 mg/dL — ABNORMAL HIGH (ref 0.70–1.33)
GFR, Est African American: 60 mL/min/{1.73_m2} (ref >=60)
GFR, Est Non African American: 52 mL/min/{1.73_m2} — ABNORMAL LOW (ref >=60)
Globulin: 2.7 g/dL (calc) (ref 1.9–3.7)
Glucose, Bld: 72 mg/dL (ref 65–99)
Potassium: 4.4 mmol/L (ref 3.5–5.3)
Sodium: 141 mmol/L (ref 135–146)
Total Bilirubin: 0.5 mg/dL (ref 0.2–1.2)
Total Protein: 7 g/dL (ref 6.1–8.1)

## 2019-07-01 LAB — HEMOGLOBIN A1C
Hgb A1c MFr Bld: 5.6 % of total Hgb (ref <5.7)
Mean Plasma Glucose: 114 (calc)
eAG (mmol/L): 6.3 (calc)

## 2019-09-09 ENCOUNTER — Other Ambulatory Visit: Payer: Self-pay | Admitting: Gastroenterology

## 2019-09-10 ENCOUNTER — Emergency Department (HOSPITAL_COMMUNITY): Payer: 59

## 2019-09-10 ENCOUNTER — Emergency Department (HOSPITAL_COMMUNITY)
Admission: EM | Admit: 2019-09-10 | Discharge: 2019-09-10 | Disposition: A | Discharge status: Home / Self Care | Payer: 59 | Attending: Emergency Medicine | Admitting: Emergency Medicine

## 2019-09-10 ENCOUNTER — Other Ambulatory Visit: Payer: Self-pay

## 2019-09-10 ENCOUNTER — Telehealth (INDEPENDENT_AMBULATORY_CARE_PROVIDER_SITE_OTHER): Payer: Self-pay | Admitting: Internal Medicine

## 2019-09-10 ENCOUNTER — Encounter (HOSPITAL_COMMUNITY): Payer: Self-pay | Admitting: Student

## 2019-09-10 DIAGNOSIS — R911 Solitary pulmonary nodule: Secondary | ICD-10-CM | POA: Diagnosis not present

## 2019-09-10 DIAGNOSIS — R0602 Shortness of breath: Secondary | ICD-10-CM | POA: Diagnosis present

## 2019-09-10 DIAGNOSIS — Z79899 Other long term (current) drug therapy: Secondary | ICD-10-CM | POA: Diagnosis not present

## 2019-09-10 DIAGNOSIS — R0789 Other chest pain: Secondary | ICD-10-CM

## 2019-09-10 DIAGNOSIS — U071 COVID-19: Secondary | ICD-10-CM | POA: Insufficient documentation

## 2019-09-10 LAB — CBC WITH DIFFERENTIAL/PLATELET
Abs Immature Granulocytes: 0.02 10*3/uL (ref 0.00–0.07)
Basophils Absolute: 0 10*3/uL (ref 0.0–0.1)
Basophils Relative: 1 %
Eosinophils Absolute: 0 10*3/uL (ref 0.0–0.5)
Eosinophils Relative: 1 %
HCT: 49.5 % (ref 39.0–52.0)
Hemoglobin: 16.5 g/dL (ref 13.0–17.0)
Immature Granulocytes: 0 %
Lymphocytes Relative: 34 %
Lymphs Abs: 1.6 10*3/uL (ref 0.7–4.0)
MCH: 29.3 pg (ref 26.0–34.0)
MCHC: 33.3 g/dL (ref 30.0–36.0)
MCV: 87.9 fL (ref 80.0–100.0)
Monocytes Absolute: 0.8 10*3/uL (ref 0.1–1.0)
Monocytes Relative: 17 %
Neutro Abs: 2.2 10*3/uL (ref 1.7–7.7)
Neutrophils Relative %: 47 %
Platelets: 191 10*3/uL (ref 150–400)
RBC: 5.63 MIL/uL (ref 4.22–5.81)
RDW: 13.7 % (ref 11.5–15.5)
WBC: 4.7 10*3/uL (ref 4.0–10.5)
nRBC: 0 % (ref 0.0–0.2)

## 2019-09-10 LAB — COMPREHENSIVE METABOLIC PANEL
ALT: 34 U/L (ref 0–44)
AST: 21 U/L (ref 15–41)
Albumin: 4 g/dL (ref 3.5–5.0)
Alkaline Phosphatase: 45 U/L (ref 38–126)
Anion gap: 9 (ref 5–15)
BUN: 14 mg/dL (ref 6–20)
CO2: 25 mmol/L (ref 22–32)
Calcium: 8.7 mg/dL — ABNORMAL LOW (ref 8.9–10.3)
Chloride: 102 mmol/L (ref 98–111)
Creatinine, Ser: 1.33 mg/dL — ABNORMAL HIGH (ref 0.61–1.24)
GFR calc Af Amer: >60 mL/min (ref >60)
GFR calc non Af Amer: >60 mL/min (ref >60)
Glucose, Bld: 95 mg/dL (ref 70–99)
Potassium: 3.7 mmol/L (ref 3.5–5.1)
Sodium: 136 mmol/L (ref 135–145)
Total Bilirubin: 0.5 mg/dL (ref 0.3–1.2)
Total Protein: 7.3 g/dL (ref 6.5–8.1)

## 2019-09-10 LAB — TROPONIN I (HIGH SENSITIVITY)
Troponin I (High Sensitivity): <2 ng/L (ref <18)
Troponin I (High Sensitivity): <2 ng/L (ref <18)

## 2019-09-10 LAB — D-DIMER, QUANTITATIVE: D-Dimer, Quant: 0.81 ug/mL-FEU — ABNORMAL HIGH (ref 0.00–0.50)

## 2019-09-10 MED ORDER — ACETAMINOPHEN 325 MG PO TABS
650.0000 mg | ORAL_TABLET | Freq: Once | ORAL | Status: AC
  Administered 2019-09-10: 650 mg via ORAL
  Filled 2019-09-10: qty 2

## 2019-09-10 MED ORDER — AEROCHAMBER PLUS FLO-VU MEDIUM MISC
1.0000 | Freq: Once | Status: AC
  Administered 2019-09-10: 1
  Filled 2019-09-10 (×2): qty 1

## 2019-09-10 MED ORDER — IOHEXOL 350 MG/ML SOLN
100.0000 mL | Freq: Once | INTRAVENOUS | Status: AC | PRN
  Administered 2019-09-10: 100 mL via INTRAVENOUS

## 2019-09-10 MED ORDER — FLUTICASONE PROPIONATE 50 MCG/ACT NA SUSP: 1.0000 | Freq: Every day | NASAL | 0 refills | Status: DC | PRN

## 2019-09-10 MED ORDER — ALBUTEROL SULFATE HFA 108 (90 BASE) MCG/ACT IN AERS
2.0000 | INHALATION_SPRAY | Freq: Once | RESPIRATORY_TRACT | Status: AC
  Administered 2019-09-10: 2 via RESPIRATORY_TRACT
  Filled 2019-09-10: qty 6.7

## 2019-09-10 MED ORDER — BENZONATATE 100 MG PO CAPS: 100.0000 mg | ORAL_CAPSULE | Freq: Three times a day (TID) | ORAL | 0 refills | Status: DC

## 2019-09-10 MED ORDER — ONDANSETRON HCL 4 MG/2ML IJ SOLN
4.0000 mg | Freq: Once | INTRAMUSCULAR | Status: AC
  Administered 2019-09-10: 4 mg via INTRAVENOUS
  Filled 2019-09-10: qty 2

## 2019-09-10 NOTE — Telephone Encounter (Signed)
Sending in limited Rx. Patient last seen in 2017 and no showed for last appointment. He needs OV for further refills.

## 2019-09-10 NOTE — ED Provider Notes (Signed)
[facility] EMERGENCY DEPARTMENT Provider Note   CSN: [id number] Arrival date & time: 09/10/19  1409     History Chief Complaint  Patient presents with  . Tkai Killey Mow is a 54 y.o. male with a history of sleep apnea, GERD, & male hypogonadism who presents to the ED with worsening dyspnea & chest discomfort since onset of feeling poorly 09/07/19 with subsequent positive COVID 19 testing 09/08/19. Patient states sxs began with subjective fever, chills, & body aches, subsequently developed nasal congestion, sore throat, loose stools, dry cough, dyspnea & chest discomfort. He states dyspnea is intermittent, worse with activity. Chest discomfort is a constant tightness, worse with coughing, no alleviating factors. He feels he cannot get mucous up when he is coughing. Denies syncope, vomiting, abdominal pain, leg pain/swelling, hemoptysis, recent surgery/trauma, recent long travel,  personal hx of cancer, or hx of DVT/PE.   HPI     Past Medical History:  Diagnosis Date  . Gastritis   . GERD (gastroesophageal reflux disease)   . Male hypogonadism 03/23/2019  . Sleep apnea   . Vitamin D deficiency disease 03/23/2019    Patient Active Problem List   Diagnosis Date Noted  . Vitamin D deficiency disease 03/23/2019  . Male hypogonadism 03/23/2019  . Sleep apnea   . Chronic nasal congestion 07/23/2018  . Circadian rhythm sleep disorder, shift work type 07/23/2018  . Overbite, excessive 07/23/2018  . Diaphoresis 07/23/2018  . Chronic intermittent hypoxia with obstructive sleep apnea 07/23/2018  . Severe obstructive sleep apnea 07/23/2018  . Dysphagia 09/28/2015  . Encounter for screening colonoscopy 09/28/2015  . AP (abdominal pain) 09/28/2015  . DYSPHAGIA 04/19/2010  . GERD 10/07/2009    Past Surgical History:  Procedure Laterality Date  . BIOPSY  11/01/2015   Procedure: BIOPSY;  Surgeon: Danie Binder, MD;  Location: [facility];  Service: Endoscopy;;  gastric  .  CHOLECYSTECTOMY    . COLONOSCOPY WITH PROPOFOL N/A 11/01/2015   Dr. Oneida Alar: internal hemorrhoids, otherwise normal.   . ESOPHAGOGASTRODUODENOSCOPY  06/10/2009   Distal esophagea stricture dialted to 23mm. Bx from distal esophagus (neg). erythema in body and antrum.  . ESOPHAGOGASTRODUODENOSCOPY  05/30/2009   distal peptic stricture dialted to 78mm, diffuse bx proven gastritis, no .hpylori  . ESOPHAGOGASTRODUODENOSCOPY (EGD) WITH PROPOFOL N/A 11/01/2015   Dr. Oneida Alar: benign-appearing esophageal stenosis s/p dilation, chronic gastritis   . HERNIA REPAIR Left    groin  . SAVORY DILATION N/A 11/01/2015   Procedure: SAVORY DILATION;  Surgeon: Danie Binder, MD;  Location: [facility];  Service: Endoscopy;  Laterality: N/A;  . VASECTOMY         Family History  Problem Relation Age of Onset  . Cancer - Other Father        mesothelioma, deceased at age 56   . Colon cancer Neg Hx     Social History   Tobacco Use  . Smoking status: Never Smoker  . Smokeless tobacco: Never Used  Substance Use Topics  . Alcohol use: No    Alcohol/week: 0.0 standard drinks  . Drug use: No    Home Medications Prior to Admission medications   Medication Sig Start Date End Date Taking? Authorizing Provider  Cholecalciferol (VITAMIN D-3) 125 MCG (5000 UT) TABS Take 10,000 Units by mouth daily at 12 noon.    [provider]  pantoprazole (PROTONIX) 40 MG tablet TAKE 1 TABLET BY MOUTH EACH MORNING 30 MINUTES PRIOR TO BREAKFAST. 09/01/18   Cyndi Bender,  Leandra Kern, NP  Testosterone Cypionate 200 MG/ML SOLN Inject 100 mg as directed 2 (two) times a week. Twice a week 04/23/19   Doree Albee, MD    Allergies    Penicillins  Review of Systems   Review of Systems  Constitutional: Positive for chills and fever.  HENT: Positive for congestion and sore throat.   Respiratory: Positive for cough, chest tightness and shortness of breath.   Cardiovascular: Positive for chest pain. Negative for palpitations and  leg swelling.  Gastrointestinal: Positive for abdominal pain, diarrhea and nausea. Negative for vomiting.  Musculoskeletal: Positive for myalgias (generalized).  Skin: Negative for rash.  Neurological: Negative for syncope.  All other systems reviewed and are negative.   Physical Exam Updated Vital Signs BP (!) 135/98 (BP Location: Right Arm)   Pulse (!) 101   Temp 98.6 F (37 C) (Oral)   Resp (!) 22   Ht 6\' 1"  (1.854 m)   Wt 96.2 kg   SpO2 96%   BMI 27.97 kg/m   Physical Exam Vitals and nursing note reviewed.  Constitutional:      General: He is not in acute distress.    Appearance: He is well-developed. He is not toxic-appearing.  HENT:     Head: Normocephalic and atraumatic.     Right Ear: Ear canal normal. Tympanic membrane is not perforated, erythematous, retracted or bulging.     Left Ear: Ear canal normal. Tympanic membrane is not perforated, erythematous, retracted or bulging.     Ears:     Comments: No mastoid erythema/swellng/tenderness.     Nose:     Right Sinus: No maxillary sinus tenderness or frontal sinus tenderness.     Left Sinus: No maxillary sinus tenderness or frontal sinus tenderness.     Mouth/Throat:     Pharynx: Oropharynx is clear. Uvula midline. No oropharyngeal exudate or posterior oropharyngeal erythema.     Comments: Posterior oropharynx is symmetric appearing. Patient tolerating own secretions without difficulty. No trismus. No drooling. No hot potato voice. No swelling beneath the tongue, submandibular compartment is soft.  Eyes:     General:        Right eye: No discharge.        Left eye: No discharge.     Conjunctiva/sclera: Conjunctivae normal.  Cardiovascular:     Rate and Rhythm: Regular rhythm. Tachycardia present.  Pulmonary:     Effort: Pulmonary effort is normal. No respiratory distress.     Breath sounds: Normal breath sounds. No wheezing, rhonchi or rales.  Chest:     Chest wall: No tenderness.  Abdominal:     General:  There is no distension.     Palpations: Abdomen is soft.     Tenderness: There is no abdominal tenderness.  Musculoskeletal:        General: No tenderness.     Cervical back: Neck supple. No rigidity.     Right lower leg: No edema.     Left lower leg: No edema.  Lymphadenopathy:     Cervical: No cervical adenopathy.  Skin:    General: Skin is warm and dry.     Findings: No rash.  Neurological:     Mental Status: He is alert.  Psychiatric:        Behavior: Behavior normal.     ED Results / Procedures / Treatments   Labs (all labs ordered are listed, but only abnormal results are displayed) Labs Reviewed  COMPREHENSIVE METABOLIC PANEL - Abnormal; Notable for the  following components:      Result Value   Creatinine, Ser 1.33 (*)    Calcium 8.7 (*)    All other components within normal limits  D-DIMER, QUANTITATIVE (NOT AT [facility]) - Abnormal; Notable for the following components:   D-Dimer, Quant 0.81 (*)    All other components within normal limits  CBC WITH DIFFERENTIAL/PLATELET  TROPONIN I (HIGH SENSITIVITY)  TROPONIN I (HIGH SENSITIVITY)    EKG None  Radiology CT Angio Chest PE W/Cm &/Or Wo Cm  Result Date: 09/10/2019 CLINICAL DATA:  PE suspected, low/intermediate prob, positive D-dimer COVID positive 2 days ago (09/08/2019). EXAM: CT ANGIOGRAPHY CHEST WITH CONTRAST TECHNIQUE: Multidetector CT imaging of the chest was performed using the standard protocol during bolus administration of intravenous contrast. Multiplanar CT image reconstructions and MIPs were obtained to evaluate the vascular anatomy. CONTRAST:  173mL OMNIPAQUE IOHEXOL 350 MG/ML SOLN COMPARISON:  Radiograph earlier this day. FINDINGS: Cardiovascular: There are no filling defects within the pulmonary arteries to suggest pulmonary embolus. Thoracic aorta is normal in caliber. No aortic dissection. Conventional branching pattern from the aortic arch. Heart is normal in size. No pericardial effusion.  Mediastinum/Nodes: No enlarged mediastinal or hilar lymph nodes. No esophageal wall thickening. No thyroid nodule. Lungs/Pleura: Trace bilateral pleural effusions and adjacent atelectasis. Faint peripheral ground-glass opacity in the right upper lobe. 2 mm right upper lobe subpleural pulmonary nodule, series 7, image 50. No pulmonary edema. Upper Abdomen: Suspected hepatic steatosis. Postcholecystectomy. No acute findings. Musculoskeletal: Degenerative spurring in the thoracic spine. There are no acute or suspicious osseous abnormalities. Review of the MIP images confirms the above findings. IMPRESSION: 1. No pulmonary embolus. 2. Trace bilateral pleural effusions and adjacent atelectasis. 3. Faint peripheral ground-glass opacity in the right upper lobe is likely secondary to known COVID pneumonia. Parenchymal involvement is minimal. 4. Incidental 2 mm right upper lobe pulmonary nodule. No follow-up needed if patient is low-risk. Non-contrast chest CT can be considered in 12 months if patient is high-risk. This recommendation follows the consensus statement: Guidelines for Management of Incidental Pulmonary Nodules Detected on CT Images: From the Fleischner Society 2017; Radiology 2017; 284:228-243. Electronically Signed   By: Keith Rake M.D.   On: 09/10/2019 18:30   DG Chest Port 1 View  Result Date: 09/10/2019 CLINICAL DATA:  54 year old male with shortness of breath. EXAM: PORTABLE CHEST 1 VIEW COMPARISON:  Chest radiograph dated 09/13/2016. FINDINGS: Minimal interstitial and vascular prominence, likely chronic. Atypical infection is not excluded clinical correlation is recommended. No focal consolidation, pleural effusion, or pneumothorax. The cardiac silhouette is within normal limits. No acute osseous pathology. IMPRESSION: Faint interstitial densities may be chronic or represent atypical infiltrate. Clinical correlation is recommended. No focal consolidation. Electronically Signed   By: Anner Crete M.D.   On: 09/10/2019 15:16    Procedures Procedures (including critical care time)   2:52 PM Cardiac monitoring rhythm strip notable for sinus tachycardia, as reviewed and interpreted by me. Cardiac monitoring was ordered due to chest pain/dyspnea and to monitor patient for dysrhythmia.  Medications Ordered in ED Medications  albuterol (VENTOLIN HFA) 108 (90 Base) MCG/ACT inhaler 2 puff (has no administration in time range)  AeroChamber Plus Flo-Vu Medium MISC 1 each (has no administration in time range)  ondansetron (ZOFRAN) injection 4 mg (has no administration in time range)  acetaminophen (TYLENOL) tablet 650 mg (has no administration in time range)    ED Course  I have reviewed the triage vital signs and the nursing notes.  Pertinent  labs & imaging results that were available during my care of the patient were reviewed by me and considered in my medical decision making (see chart for details).    Jareth E Rizor was evaluated in Emergency Department on 09/10/2019 for the symptoms described in the history of present illness. He/she was evaluated in the context of the global COVID-19 pandemic, which necessitated consideration that the patient might be at risk for infection with the SARS-CoV-2 virus that causes COVID-19. Institutional protocols and algorithms that pertain to the evaluation of patients at risk for COVID-19 are in a state of rapid change based on information released by regulatory bodies including the CDC and federal and state organizations. These policies and algorithms were followed during the patient's care in the ED.  MDM Rules/Calculators/A&P                      Patient known COVID-19 positive presents to the emergency department with worsening dyspnea and chest tightness.  Nontoxic, resting comfortably, vitals WNL with exception of mildly elevated BP and intermittent tachycardia.  Lungs are clear.  No peripheral edema.  Will give inhaler for symptomatic  relief.  Check labs including a troponin and D-dimer as well as an EKG and chest x-Orvell.  Chest x-Hardeep: Faint interstitial densities may be chronic or represent atypical infiltrate. Clinical correlation is recommended. No focal consolidation. CBC: No anemia or leukocytosis. CMP: Creatinine mildly elevated but improved from prior on chart review for additional history..  Mild hypocalcemia, no significant electrolyte derangement. Troponin: < 2, < 2 EKG: No STEMI D-dimer: Elevated  CTA:  No pulmonary embolus. Trace bilateral pleural effusions and adjacent atelectasis. Faint peripheral ground-glass opacity in the right upper lobe is likely secondary to known COVID pneumonia. Parenchymal involvement is minimal. Incidental 2 mm right upper lobe pulmonary nodule.   Patient feeling somewhat improved in the ED.  SPO2 maintaining greater than 94% @ rest and with movement in exam room. Patient appears appropriate for discharge home at this time with symptomatic management. I discussed results, treatment plan, need for follow-up, and return precautions with the patient. Provided opportunity for questions, patient confirmed understanding and is in agreement with plan.    Final Clinical Impression(s) / ED Diagnoses Final diagnoses:  COVID-19  Pulmonary nodule  Chest tightness    Rx / DC Orders ED Discharge Orders         Ordered    fluticasone (FLONASE) 50 MCG/ACT nasal spray  Daily PRN     09/10/19 1939    benzonatate (TESSALON) 100 MG capsule  Every 8 hours     09/10/19 1939           Amaryllis Dyke, PA-C 09/10/19 1940    Nat Christen, MD 09/11/19 [id number]

## 2019-09-10 NOTE — Discharge Instructions (Signed)
You were seen in the emergency department today for chest pain. Your work-up in the emergency department has been overall reassuring. Your labs have been fairly normal and or similar to previous blood work you have had done-your kidney function is mildly elevated but better than it has been in the past, please avoid anti-inflammatory such as ibuprofen, Advil, Motrin, Goody powder etc. as these can further irritate your kidneys.  Your EKG and the enzyme we use to check your heart did not show an acute heart attack at this time. Your chest x-Pasha was normal.   Your CT scan showed findings findings consistent with Covid.  There is also a pulmonary nodule on your CT imaging, please discuss with your primary care provider as you may need repeat CT imaging.   We are sending you home with an albuterol inhaler to use 1 to 2 puffs as needed for shortness of breath/wheezing every 4-6 hours, Tessalon use every 8 hours as needed for coughing, and Flonase to use 1 spray per nostril daily as needed for congestion.  We have prescribed you new medication(s) today. Discuss the medications prescribed today with your pharmacist as they can have adverse effects and interactions with your other medicines including over the counter and prescribed medications. Seek medical evaluation if you start to experience new or abnormal symptoms after taking one of these medicines, seek care immediately if you start to experience difficulty breathing, feeling of your throat closing, facial swelling, or rash as these could be indications of a more serious allergic reaction  We are instructing patient's with COVID 19 or symptoms of COVID 19 to quarantine themselves for 14 days. You may be able to discontinue self quarantine if the following conditions are met:   Persons with COVID-19 who have symptoms and were directed to care for themselves at home may discontinue home isolation under the  following conditions: - It has been at least 7 days  have passed since symptoms first appeared. - AND at least 3 days (72 hours) have passed since recovery defined as resolution of fever without the use of fever-reducing medications and improvement in respiratory symptoms (e.g., cough, shortness of breath)  Please follow the below quarantine instructions.   Please follow up with primary care within 3-5 days for re-evaluation- call prior to going to the office to make them aware of your symptoms as some offices are altering their method of seeing patients with COVID 19 symptoms. Return to the ER for new or worsening symptoms including but not limited to increased work of breathing, chest pain, passing out, or any other concerns.       Person Under Monitoring Name: James Blankenship  Location: [address] [place] [address]   Infection Prevention Recommendations for Individuals Confirmed to have, or Being Evaluated for, 2019 Novel Coronavirus (COVID-19) Infection Who Receive Care at Home  Individuals who are confirmed to have, or are being evaluated for, COVID-19 should follow the prevention steps below until a healthcare provider or local or state health department says they can return to normal activities.  Stay home except to get medical care You should restrict activities outside your home, except for getting medical care. Do not go to work, school, or public areas, and do not use public transportation or taxis.  Call ahead before visiting your doctor Before your medical appointment, call the healthcare provider and tell them that you have, or are being evaluated for, COVID-19 infection. This will help the healthcare provider's office take steps  to keep other people from getting infected. Ask your healthcare provider to call the local or state health department.  Monitor your symptoms Seek prompt medical attention if your illness is worsening (e.g., difficulty breathing). Before going to your medical appointment, call the  healthcare provider and tell them that you have, or are being evaluated for, COVID-19 infection. Ask your healthcare provider to call the local or state health department.  Wear a facemask You should wear a facemask that covers your nose and mouth when you are in the same room with other people and when you visit a healthcare provider. People who live with or visit you should also wear a facemask while they are in the same room with you.  Separate yourself from other people in your home As much as possible, you should stay in a different room from other people in your home. Also, you should use a separate bathroom, if available.  Avoid sharing household items You should not share dishes, drinking glasses, cups, eating utensils, towels, bedding, or other items with other people in your home. After using these items, you should wash them thoroughly with soap and water.  Cover your coughs and sneezes Cover your mouth and nose with a tissue when you cough or sneeze, or you can cough or sneeze into your sleeve. Throw used tissues in a lined trash can, and immediately wash your hands with soap and water for at least 20 seconds or use an alcohol-based hand rub.  Wash your [facility] your hands often and thoroughly with soap and water for at least 20 seconds. You can use an alcohol-based hand sanitizer if soap and water are not available and if your hands are not visibly dirty. Avoid touching your eyes, nose, and mouth with unwashed hands.   Prevention Steps for Caregivers and Household Members of Individuals Confirmed to have, or Being Evaluated for, COVID-19 Infection Being Cared for in the Home  If you live with, or provide care at home for, a person confirmed to have, or being evaluated for, COVID-19 infection please follow these guidelines to prevent infection:  Follow healthcare provider's instructions Make sure that you understand and can help the patient follow any healthcare  provider instructions for all care.  Provide for the patient's basic needs You should help the patient with basic needs in the home and provide support for getting groceries, prescriptions, and other personal needs.  Monitor the patient's symptoms If they are getting sicker, call his or her medical provider and tell them that the patient has, or is being evaluated for, COVID-19 infection. This will help the healthcare provider's office take steps to keep other people from getting infected. Ask the healthcare provider to call the local or state health department.  Limit the number of people who have contact with the patient If possible, have only one caregiver for the patient. Other household members should stay in another home or place of residence. If this is not possible, they should stay in another room, or be separated from the patient as much as possible. Use a separate bathroom, if available. Restrict visitors who do not have an essential need to be in the home.  Keep older adults, very young children, and other sick people away from the patient Keep older adults, very young children, and those who have compromised immune systems or chronic health conditions away from the patient. This includes people with chronic heart, lung, or kidney conditions, diabetes, and cancer.  Ensure good ventilation Make sure  that shared spaces in the home have good air flow, such as from an air conditioner or an opened window, weather permitting.  Wash your hands often Wash your hands often and thoroughly with soap and water for at least 20 seconds. You can use an alcohol based hand sanitizer if soap and water are not available and if your hands are not visibly dirty. Avoid touching your eyes, nose, and mouth with unwashed hands. Use disposable paper towels to dry your hands. If not available, use dedicated cloth towels and replace them when they become wet.  Wear a facemask and gloves Wear a  disposable facemask at all times in the room and gloves when you touch or have contact with the patient's blood, body fluids, and/or secretions or excretions, such as sweat, saliva, sputum, nasal mucus, vomit, urine, or feces.  Ensure the mask fits over your nose and mouth tightly, and do not touch it during use. Throw out disposable facemasks and gloves after using them. Do not reuse. Wash your hands immediately after removing your facemask and gloves. If your personal clothing becomes contaminated, carefully remove clothing and launder. Wash your hands after handling contaminated clothing. Place all used disposable facemasks, gloves, and other waste in a lined container before disposing them with other household waste. Remove gloves and wash your hands immediately after handling these items.  Do not share dishes, glasses, or other household items with the patient Avoid sharing household items. You should not share dishes, drinking glasses, cups, eating utensils, towels, bedding, or other items with a patient who is confirmed to have, or being evaluated for, COVID-19 infection. After the person uses these items, you should wash them thoroughly with soap and water.  Wash laundry thoroughly Immediately remove and wash clothes or bedding that have blood, body fluids, and/or secretions or excretions, such as sweat, saliva, sputum, nasal mucus, vomit, urine, or feces, on them. Wear gloves when handling laundry from the patient. Read and follow directions on labels of laundry or clothing items and detergent. In general, wash and dry with the warmest temperatures recommended on the label.  Clean all areas the individual has used often Clean all touchable surfaces, such as counters, tabletops, doorknobs, bathroom fixtures, toilets, phones, keyboards, tablets, and bedside tables, every day. Also, clean any surfaces that may have blood, body fluids, and/or secretions or excretions on them. Wear gloves when  cleaning surfaces the patient has come in contact with. Use a diluted bleach solution (e.g., dilute bleach with 1 part bleach and 10 parts water) or a household disinfectant with a label that says EPA-registered for coronaviruses. To make a bleach solution at home, add 1 tablespoon of bleach to 1 quart (4 cups) of water. For a larger supply, add  cup of bleach to 1 gallon (16 cups) of water. Read labels of cleaning products and follow recommendations provided on product labels. Labels contain instructions for safe and effective use of the cleaning product including precautions you should take when applying the product, such as wearing gloves or eye protection and making sure you have good ventilation during use of the product. Remove gloves and wash hands immediately after cleaning.  Monitor yourself for signs and symptoms of illness Caregivers and household members are considered close contacts, should monitor their health, and will be asked to limit movement outside of the home to the extent possible. Follow the monitoring steps for close contacts listed on the symptom monitoring form.   ? If you have additional questions, contact your  local health department or call the epidemiologist on call at [phone] (available 24/7). ? This guidance is subject to change. For the most up-to-date guidance from [facility], please refer to their website: [email or web address]

## 2019-09-10 NOTE — ED Triage Notes (Signed)
Pt reports weakness, sob, chest tightness, back pain, nausea, and diarrhea.  REports tested positive for covid Tuesday.

## 2019-09-11 NOTE — Telephone Encounter (Signed)
Left a detailed message. Pt is aware for additional refills, he will need an appointment. Limited supply of Pantoprazole was sent to pts pharmacy.

## 2019-09-13 ENCOUNTER — Encounter (INDEPENDENT_AMBULATORY_CARE_PROVIDER_SITE_OTHER): Payer: Self-pay | Admitting: Internal Medicine

## 2019-09-14 ENCOUNTER — Encounter: Payer: Self-pay | Admitting: Gastroenterology

## 2019-09-14 ENCOUNTER — Ambulatory Visit (INDEPENDENT_AMBULATORY_CARE_PROVIDER_SITE_OTHER): Payer: 59 | Admitting: Internal Medicine

## 2019-09-14 ENCOUNTER — Other Ambulatory Visit (INDEPENDENT_AMBULATORY_CARE_PROVIDER_SITE_OTHER): Payer: Self-pay | Admitting: Internal Medicine

## 2019-09-14 ENCOUNTER — Encounter (INDEPENDENT_AMBULATORY_CARE_PROVIDER_SITE_OTHER): Payer: Self-pay | Admitting: Internal Medicine

## 2019-09-14 DIAGNOSIS — U071 COVID-19: Secondary | ICD-10-CM

## 2019-09-14 MED ORDER — ONDANSETRON HCL 4 MG PO TABS: 4.0000 mg | ORAL_TABLET | Freq: Three times a day (TID) | ORAL | 0 refills | Status: DC | PRN

## 2019-09-14 MED ORDER — LEVOFLOXACIN 500 MG PO TABS: 500.0000 mg | ORAL_TABLET | Freq: Every day | ORAL | 0 refills | Status: DC

## 2019-09-14 MED ORDER — BENZONATATE 100 MG PO CAPS: 100.0000 mg | ORAL_CAPSULE | Freq: Two times a day (BID) | ORAL | 0 refills | Status: DC | PRN

## 2019-09-14 MED ORDER — GUAIFENESIN-CODEINE 200-10 MG/5ML PO LIQD: 5.0000 mL | Freq: Four times a day (QID) | ORAL | 0 refills | Status: DC | PRN

## 2019-09-14 NOTE — Telephone Encounter (Signed)
CC: n&v, dry heaving when eats

## 2019-09-14 NOTE — Telephone Encounter (Signed)
Done

## 2019-09-14 NOTE — Progress Notes (Signed)
Metrics: Intervention Frequency ACO  Documented Smoking Status Yearly  Screened one or more times in 24 months  Cessation Counseling or  Active cessation medication Past 24 months  Past 24 months   Guideline developer: UpToDate (See UpToDate for funding source) Date Released: 2014       Wellness Office Visit  Subjective:  Patient ID: James Blankenship, male    DOB: Dec 17, 1965  Age: 54 y.o. MRN: [id number]  CC: COVID-19 symptoms  HPI  This patient was diagnosed with COVID-19 disease approximately a week ago when symptoms started.  He now currently has a mostly dry cough associated with dyspnea on fairly minimal exertion and he feels profoundly fatigued.  He denies any fever. He did go to the emergency room on March 4, 4 days ago and a CT scan of the chest did not show pulmonary embolism but showed groundglass opacity in the right upper lobe consistent with Covid 19.  There was an incidental 2 mm right upper lobe pulmonary nodule. Past Medical History:  Diagnosis Date  . Gastritis   . GERD (gastroesophageal reflux disease)   . Male hypogonadism 03/23/2019  . Sleep apnea   . Vitamin D deficiency disease 03/23/2019      Family History  Problem Relation Age of Onset  . Cancer - Other Father        mesothelioma, deceased at age 77   . Colon cancer Neg Hx     Social History   Social History Narrative   Married since 2019,lives with wife and extended family.Works at [employer].   Social History   Tobacco Use  . Smoking status: Never Smoker  . Smokeless tobacco: Never Used  Substance Use Topics  . Alcohol use: No    Alcohol/week: 0.0 standard drinks    Current Meds  Medication Sig  . benzonatate (TESSALON) 100 MG capsule Take 1 capsule (100 mg total) by mouth every 8 (eight) hours.  . Cholecalciferol (VITAMIN D-3) 125 MCG (5000 UT) TABS Take 10,000 Units by mouth daily at 12 noon.  . fluticasone (FLONASE) 50 MCG/ACT nasal spray Place 1 spray into both nostrils daily as  needed for allergies or rhinitis.  Marland Kitchen ondansetron (ZOFRAN) 4 MG tablet Take 1 tablet (4 mg total) by mouth every 8 (eight) hours as needed for nausea or vomiting.  . pantoprazole (PROTONIX) 40 MG tablet TAKE 1 TABLET BY MOUTH EACH MORNING 30 MINUTES PRIOR TO BREAKFAST.  Marland Kitchen Testosterone Cypionate 200 MG/ML SOLN Inject 100 mg as directed 2 (two) times a week. Twice a week (Patient taking differently: Inject 100 mg as directed 2 (two) times a week. Patient takes on Mondays and Thursdays)      Objective:   Today's Vitals: There were no vitals taken for this visit. Vitals with BMI 09/14/2019 09/10/2019 09/10/2019  Height (No Data) - -  Weight (No Data) - -  BMI - - -  Systolic (No Data) 99991111 [id number]  Diastolic (No Data) 76 87  Pulse - 85 75     Physical Exam  His speech is clear on the phone but I can hear intermittent coughing.  He does not appear to be in respiratory distress whilst he is talking to me.     Assessment   1. COVID-19       Tests ordered No orders of the defined types were placed in this encounter.    Plan: 1. I have sent prescriptions for guaifenesin with codeine, Levaquin as he is coughing up purulent sputum now as  well as Tessalon capsules. 2. If he does not improve and in particular he is aware that if his breathing gets worse, he should call me or go to the emergency room. 3. This phone call lasted 12 minutes.   Meds ordered this encounter  Medications  . benzonatate (TESSALON) 100 MG capsule    Sig: Take 1 capsule (100 mg total) by mouth 2 (two) times daily as needed for cough.    Dispense:  20 capsule    Refill:  0  . guaiFENesin-Codeine 200-10 MG/5ML LIQD    Sig: Take 5 mLs by mouth every 6 (six) hours as needed (cough).    Dispense:  210 mL    Refill:  0  . levofloxacin (LEVAQUIN) 500 MG tablet    Sig: Take 1 tablet (500 mg total) by mouth daily.    Dispense:  7 tablet    Refill:  0    Camera Krienke Luther Parody, MD

## 2019-09-15 ENCOUNTER — Telehealth (INDEPENDENT_AMBULATORY_CARE_PROVIDER_SITE_OTHER): Payer: Self-pay

## 2019-09-15 ENCOUNTER — Other Ambulatory Visit (INDEPENDENT_AMBULATORY_CARE_PROVIDER_SITE_OTHER): Payer: Self-pay | Admitting: Internal Medicine

## 2019-09-15 DIAGNOSIS — R059 Cough, unspecified: Secondary | ICD-10-CM

## 2019-09-15 DIAGNOSIS — R05 Cough: Secondary | ICD-10-CM

## 2019-09-15 MED ORDER — GUAIFENESIN-CODEINE 200-10 MG/5ML PO LIQD: 5.0000 mL | Freq: Four times a day (QID) | ORAL | 0 refills | Status: DC | PRN

## 2019-09-15 NOTE — Telephone Encounter (Signed)
James Blankenship, CMA  

## 2019-09-16 NOTE — Telephone Encounter (Signed)
Medication was sent to pharmacy.

## 2019-09-17 ENCOUNTER — Ambulatory Visit (INDEPENDENT_AMBULATORY_CARE_PROVIDER_SITE_OTHER): Payer: 59 | Admitting: Internal Medicine

## 2019-09-17 ENCOUNTER — Encounter (INDEPENDENT_AMBULATORY_CARE_PROVIDER_SITE_OTHER): Payer: Self-pay | Admitting: Internal Medicine

## 2019-09-17 DIAGNOSIS — U071 COVID-19: Secondary | ICD-10-CM

## 2019-09-17 NOTE — Progress Notes (Signed)
Metrics: Intervention Frequency ACO  Documented Smoking Status Yearly  Screened one or more times in 24 months  Cessation Counseling or  Active cessation medication Past 24 months  Past 24 months   Guideline developer: UpToDate (See UpToDate for funding source) Date Released: 2014       Wellness Office Visit  Subjective:  Patient ID: James Blankenship, male    DOB: 1966/06/17  Age: 54 y.o. MRN: [id number]  CC: This is an audio telemedicine visit with the permission of the patient who is at home and I am in my office.  I used 2 identifiers. This visit is to follow-up on his COVID-19 symptoms. HPI  Overall, he thinks he is improving but he is extremely exhausted and does not seem to have much energy.  Thankfully, he does not seem to have excessive dyspnea on exertion.  He has not really tried to be active but he will try today.  He still has a dry cough mostly.  He is using cough medicine and also a dance return for episodes of nausea. Past Medical History:  Diagnosis Date  . Gastritis   . GERD (gastroesophageal reflux disease)   . Male hypogonadism 03/23/2019  . Sleep apnea   . Vitamin D deficiency disease 03/23/2019      Family History  Problem Relation Age of Onset  . Cancer - Other Father        mesothelioma, deceased at age 14   . Colon cancer Neg Hx     Social History   Social History Narrative   Married since 2019,lives with wife and extended family.Works at [employer].   Social History   Tobacco Use  . Smoking status: Never Smoker  . Smokeless tobacco: Never Used  Substance Use Topics  . Alcohol use: No    Alcohol/week: 0.0 standard drinks    Current Meds  Medication Sig  . benzonatate (TESSALON) 100 MG capsule Take 1 capsule (100 mg total) by mouth every 8 (eight) hours.  . Cholecalciferol (VITAMIN D-3) 125 MCG (5000 UT) TABS Take 10,000 Units by mouth daily at 12 noon.  . fluticasone (FLONASE) 50 MCG/ACT nasal spray Place 1 spray into both nostrils daily  as needed for allergies or rhinitis.  Marland Kitchen guaiFENesin-Codeine 200-10 MG/5ML LIQD Take 5 mLs by mouth every 6 (six) hours as needed (cough).  Marland Kitchen levofloxacin (LEVAQUIN) 500 MG tablet Take 1 tablet (500 mg total) by mouth daily.  . ondansetron (ZOFRAN) 4 MG tablet Take 1 tablet (4 mg total) by mouth every 8 (eight) hours as needed for nausea or vomiting.  . pantoprazole (PROTONIX) 40 MG tablet TAKE 1 TABLET BY MOUTH EACH MORNING 30 MINUTES PRIOR TO BREAKFAST.  Marland Kitchen Testosterone Cypionate 200 MG/ML SOLN Inject 100 mg as directed 2 (two) times a week. Twice a week (Patient taking differently: Inject 100 mg as directed 2 (two) times a week. Patient takes on Mondays and Thursdays)  . [DISCONTINUED] benzonatate (TESSALON) 100 MG capsule Take 1 capsule (100 mg total) by mouth 2 (two) times daily as needed for cough.       Objective:   Today's Vitals: There were no vitals taken for this visit. Vitals with BMI 09/17/2019 09/14/2019 09/10/2019  Height (No Data) (No Data) -  Weight (No Data) (No Data) -  BMI - - -  Systolic (No Data) (No Data) 99991111  Diastolic (No Data) (No Data) 76  Pulse - - 85     Physical Exam  His speech appeared normal and he  is alert and orientated.  He did not appear to be dyspneic while talking to me.     Assessment   1. COVID-19       Tests ordered No orders of the defined types were placed in this encounter.    Plan: 1. I have recommended he continue with conservative measures, drink plenty of fluids, vitamin D3, vitamin C and try to eat healthy.  I recommended investing in an oxygen saturation monitor to make sure his oxygen levels are acceptable.  In the meantime, if he gets more dyspneic, he will let us know. 2. He is clearly not fit enough to go back to work at the present time and I have told him to quarantine for total of 3 weeks from the date of onset of symptoms as he does in my opinion have moderate COVID-19 disease. 3. This phone call lasted 6 minutes and 12  seconds.   No orders of the defined types were placed in this encounter.   Doree Albee, MD

## 2019-09-23 ENCOUNTER — Telehealth (INDEPENDENT_AMBULATORY_CARE_PROVIDER_SITE_OTHER): Payer: Self-pay | Admitting: Internal Medicine

## 2019-09-23 ENCOUNTER — Telehealth: Payer: Self-pay | Admitting: Gastroenterology

## 2019-09-23 NOTE — Telephone Encounter (Signed)
Since his symptoms started on/around March 1, and he is considered to have moderate disease, he should be in quarantine for 3 weeks which would take him to roundabout March 21/22nd.  I will be happy to see him next Monday to further evaluate his fatigue/low energy levels.

## 2019-09-23 NOTE — Telephone Encounter (Signed)
error 

## 2019-09-24 ENCOUNTER — Encounter (INDEPENDENT_AMBULATORY_CARE_PROVIDER_SITE_OTHER): Payer: Self-pay | Admitting: Internal Medicine

## 2019-09-24 ENCOUNTER — Other Ambulatory Visit (INDEPENDENT_AMBULATORY_CARE_PROVIDER_SITE_OTHER): Payer: Self-pay | Admitting: Internal Medicine

## 2019-09-24 ENCOUNTER — Ambulatory Visit (INDEPENDENT_AMBULATORY_CARE_PROVIDER_SITE_OTHER): Payer: 59 | Admitting: Internal Medicine

## 2019-09-24 ENCOUNTER — Other Ambulatory Visit: Payer: Self-pay

## 2019-09-24 VITALS — BP 144/80 | HR 88 | Temp 98.4°F | Ht 73.0 in | Wt 203.0 lb

## 2019-09-24 DIAGNOSIS — U071 COVID-19: Secondary | ICD-10-CM

## 2019-09-24 DIAGNOSIS — R5383 Other fatigue: Secondary | ICD-10-CM | POA: Diagnosis not present

## 2019-09-24 DIAGNOSIS — R05 Cough: Secondary | ICD-10-CM

## 2019-09-24 DIAGNOSIS — R5381 Other malaise: Secondary | ICD-10-CM

## 2019-09-24 DIAGNOSIS — R059 Cough, unspecified: Secondary | ICD-10-CM

## 2019-09-24 NOTE — Progress Notes (Signed)
Metrics: Intervention Frequency ACO  Documented Smoking Status Yearly  Screened one or more times in 24 months  Cessation Counseling or  Active cessation medication Past 24 months  Past 24 months   Guideline developer: UpToDate (See UpToDate for funding source) Date Released: 2014       Wellness Office Visit  Subjective:  Patient ID: James Blankenship, male    DOB: [birth year]  Age: 54 y.o. MRN: [id number]  CC: [id number] disease HPI  This man was diagnosed with COVID-19 disease on September 07, 2019.  He continues to have symptoms of extreme fatigue, nausea, dry cough.  Thankfully, his oxygen saturations are normal on room air.  He did go to the emergency room on September 10, 2019 which shows evidence of COVID-19 disease in his lungs. He also tells me he has discontinued testosterone therapy since his illness started as he was so fatigued.  He continues to take vitamin D3 and vitamin C. Past Medical History:  Diagnosis Date  . Gastritis   . GERD (gastroesophageal reflux disease)   . Male hypogonadism 03/23/2019  . Sleep apnea   . Vitamin D deficiency disease 03/23/2019      Family History  Problem Relation Age of Onset  . Cancer - Other Father        mesothelioma, deceased at age 45   . Colon cancer Neg Hx     Social History   Social History Narrative   Married since 2019,lives with wife and extended family.Works at [employer].   Social History   Tobacco Use  . Smoking status: Never Smoker  . Smokeless tobacco: Never Used  Substance Use Topics  . Alcohol use: No    Alcohol/week: 0.0 standard drinks    Current Meds  Medication Sig  . Cholecalciferol (VITAMIN D-3) 125 MCG (5000 UT) TABS Take 10,000 Units by mouth daily at 12 noon.  . fluticasone (FLONASE) 50 MCG/ACT nasal spray Place 1 spray into both nostrils daily as needed for allergies or rhinitis.  Marland Kitchen guaiFENesin-Codeine 200-10 MG/5ML LIQD Take 5 mLs by mouth every 6 (six) hours as needed (cough).  . ondansetron  (ZOFRAN) 4 MG tablet Take 1 tablet (4 mg total) by mouth every 8 (eight) hours as needed for nausea or vomiting.  . pantoprazole (PROTONIX) 40 MG tablet TAKE 1 TABLET BY MOUTH EACH MORNING 30 MINUTES PRIOR TO BREAKFAST.  Marland Kitchen Testosterone Cypionate 200 MG/ML SOLN Inject 100 mg as directed 2 (two) times a week. Twice a week (Patient taking differently: Inject 100 mg as directed 2 (two) times a week. Patient takes on Mondays and Thursdays)  . [DISCONTINUED] benzonatate (TESSALON) 100 MG capsule Take 1 capsule (100 mg total) by mouth every 8 (eight) hours.  . [DISCONTINUED] levofloxacin (LEVAQUIN) 500 MG tablet Take 1 tablet (500 mg total) by mouth daily.       Objective:   Today's Vitals: BP (!) 144/80 (BP Location: Left Arm, Patient Position: Sitting, Cuff Size: Normal)   Pulse 88   Temp 98.4 F (36.9 C) (Oral)   Ht 6\' 1"  (1.854 m)   Wt 203 lb (92.1 kg)   SpO2 99%   BMI 26.78 kg/m  Vitals with BMI 09/24/2019 09/17/2019 09/14/2019  Height 6\' 1"  (No Data) (No Data)  Weight 203 lbs (No Data) (No Data)  BMI XX123456 - -  Systolic 123456 (No Data) (No Data)  Diastolic 80 (No Data) (No Data)  Pulse 88 - -     Physical Exam  Although he does not feel  well, he clinically does not actually look unwell.  His oxygen saturation is very good on room air.  His lung fields are clear clinically now.  He is alert and orientated.     Assessment   1. COVID-19   2. Cough   3. Malaise and fatigue       Tests ordered Orders Placed This Encounter  Procedures  . CBC  . COMPLETE METABOLIC PANEL WITH GFR     Plan: 1. Blood work is ordered. 2. I think because he is extremely fatigued, he will not be able to return to work at least for another couple of weeks or so.  I have written him a note to be off work until October 13, 2019. 3. I recommended he continue to make sure he is drinking lots of water every day, walking when he can, vitamin C, vitamin D3 and also resume testosterone therapy twice a week  as he was doing before. 4. Further recommendations will depend on blood results. 5. Follow-up in 3 months or earlier should the need arise.   No orders of the defined types were placed in this encounter.   Doree Albee, MD

## 2019-09-25 LAB — COMPLETE METABOLIC PANEL WITH GFR
AG Ratio: 1.3 (calc) (ref 1.0–2.5)
ALT: 33 U/L (ref 9–46)
AST: 20 U/L (ref 10–35)
Albumin: 3.9 g/dL (ref 3.6–5.1)
Alkaline phosphatase (APISO): 46 U/L (ref 35–144)
BUN: 21 mg/dL (ref 7–25)
CO2: 27 mmol/L (ref 20–32)
Calcium: 9.5 mg/dL (ref 8.6–10.3)
Chloride: 105 mmol/L (ref 98–110)
Creat: 1.17 mg/dL (ref 0.70–1.33)
GFR, Est African American: 81 mL/min/{1.73_m2} (ref >=60)
GFR, Est Non African American: 70 mL/min/{1.73_m2} (ref >=60)
Globulin: 3 g/dL (calc) (ref 1.9–3.7)
Glucose, Bld: 83 mg/dL (ref 65–99)
Potassium: 5 mmol/L (ref 3.5–5.3)
Sodium: 138 mmol/L (ref 135–146)
Total Bilirubin: 0.6 mg/dL (ref 0.2–1.2)
Total Protein: 6.9 g/dL (ref 6.1–8.1)

## 2019-09-25 LAB — CBC
HCT: 44.8 % (ref 38.5–50.0)
Hemoglobin: 15 g/dL (ref 13.2–17.1)
MCH: 28.9 pg (ref 27.0–33.0)
MCHC: 33.5 g/dL (ref 32.0–36.0)
MCV: 86.3 fL (ref 80.0–100.0)
MPV: 10 fL (ref 7.5–12.5)
Platelets: 357 10*3/uL (ref 140–400)
RBC: 5.19 10*6/uL (ref 4.20–5.80)
RDW: 13.2 % (ref 11.0–15.0)
WBC: 4.7 10*3/uL (ref 3.8–10.8)

## 2019-09-30 ENCOUNTER — Ambulatory Visit (INDEPENDENT_AMBULATORY_CARE_PROVIDER_SITE_OTHER): Payer: 59 | Admitting: Internal Medicine

## 2019-11-09 ENCOUNTER — Other Ambulatory Visit (INDEPENDENT_AMBULATORY_CARE_PROVIDER_SITE_OTHER): Payer: Self-pay | Admitting: Internal Medicine

## 2019-11-10 ENCOUNTER — Ambulatory Visit: Payer: 59 | Admitting: Gastroenterology

## 2019-12-30 ENCOUNTER — Ambulatory Visit (INDEPENDENT_AMBULATORY_CARE_PROVIDER_SITE_OTHER): Payer: Self-pay | Admitting: Internal Medicine

## 2020-01-20 ENCOUNTER — Other Ambulatory Visit (INDEPENDENT_AMBULATORY_CARE_PROVIDER_SITE_OTHER): Payer: Self-pay | Admitting: Internal Medicine

## 2020-01-20 ENCOUNTER — Other Ambulatory Visit: Payer: Self-pay

## 2020-01-20 ENCOUNTER — Encounter (INDEPENDENT_AMBULATORY_CARE_PROVIDER_SITE_OTHER): Payer: Self-pay | Admitting: Internal Medicine

## 2020-01-20 ENCOUNTER — Telehealth (INDEPENDENT_AMBULATORY_CARE_PROVIDER_SITE_OTHER): Payer: 59 | Admitting: Internal Medicine

## 2020-01-20 VITALS — Temp 100.5°F | Ht 72.0 in | Wt 209.0 lb

## 2020-01-20 DIAGNOSIS — J0100 Acute maxillary sinusitis, unspecified: Secondary | ICD-10-CM

## 2020-01-20 MED ORDER — PREDNISONE 20 MG PO TABS: 40.0000 mg | ORAL_TABLET | Freq: Every day | ORAL | 1 refills | Status: DC

## 2020-01-20 MED ORDER — AZITHROMYCIN 250 MG PO TABS: ORAL_TABLET | ORAL | 0 refills | Status: DC

## 2020-01-20 NOTE — Progress Notes (Signed)
Metrics: Intervention Frequency ACO  Documented Smoking Status Yearly  Screened one or more times in 24 months  Cessation Counseling or  Active cessation medication Past 24 months  Past 24 months   Guideline developer: UpToDate (See UpToDate for funding source) Date Released: 2014       Wellness Office Visit  Subjective:  Patient ID: James Blankenship, male    DOB: [birth year]  Age: 54 y.o. MRN: [id number]  CC: This is an audiovisual telemedicine visit with the permission of the patient who is at home and I am in my office telemedicine visit with the permission of the patient who is at home and I am in my office.  I was able to use 2 identifiers to identify the patient. Chief complaint is sinus congestion and pressure. HPI  His symptoms started 2 to 3 days ago after he had mowed the lawn.  He started sneezing and started having sinus congestion and now has a productive cough of yellow sputum.  He denies any myalgia but he did have a low-grade increased temperature this morning.  He did have COVID-19 disease in March but has not been vaccinated yet.  He was under the impression that he had to wait till August. Past Medical History:  Diagnosis Date  . Gastritis   . GERD (gastroesophageal reflux disease)   . Male hypogonadism 03/23/2019  . Sleep apnea   . Vitamin D deficiency disease 03/23/2019   Past Surgical History:  Procedure Laterality Date  . BIOPSY  11/01/2015   Procedure: BIOPSY;  Surgeon: Danie Binder, MD;  [facility];  Service: Endoscopy;;  gastric  . CHOLECYSTECTOMY    . COLONOSCOPY WITH PROPOFOL N/A 11/01/2015   Dr. Oneida Alar: internal hemorrhoids, otherwise normal.   . ESOPHAGOGASTRODUODENOSCOPY  06/10/2009   Distal esophagea stricture dialted to 71mm. Bx from distal esophagus (neg). erythema in body and antrum.  . ESOPHAGOGASTRODUODENOSCOPY  05/30/2009   distal peptic stricture dialted to 70mm, diffuse bx proven gastritis, no .hpylori  . ESOPHAGOGASTRODUODENOSCOPY  (EGD) WITH PROPOFOL N/A 11/01/2015   Dr. Oneida Alar: benign-appearing esophageal stenosis s/p dilation, chronic gastritis   . HERNIA REPAIR Left    groin  . SAVORY DILATION N/A 11/01/2015   Procedure: SAVORY DILATION;  Surgeon: Danie Binder, MD;  [facility]: [facility];  Service: Endoscopy;  Laterality: N/A;  . VASECTOMY       Family History  Problem Relation Age of Onset  . Cancer - Other Father        mesothelioma, deceased at age 51   . Colon cancer Neg Hx     Social History   Social History Narrative   Married since 2019,lives with wife and extended family.Works at [employer].   Social History   Tobacco Use  . Smoking status: Never Smoker  . Smokeless tobacco: Never Used  Substance Use Topics  . Alcohol use: No    Alcohol/week: 0.0 standard drinks    Current Meds  Medication Sig  . Cholecalciferol (VITAMIN D-3) 125 MCG (5000 UT) TABS Take 10,000 Units by mouth daily at 12 noon.  . fluticasone (FLONASE) 50 MCG/ACT nasal spray Place 1 spray into both nostrils daily as needed for allergies or rhinitis.  Marland Kitchen guaiFENesin-Codeine 200-10 MG/5ML LIQD Take 5 mLs by mouth every 6 (six) hours as needed (cough).  . ondansetron (ZOFRAN) 4 MG tablet Take 1 tablet (4 mg total) by mouth every 8 (eight) hours as needed for nausea or vomiting.  . pantoprazole (PROTONIX) 40 MG  tablet TAKE 1 TABLET BY MOUTH EACH MORNING 30 MINUTES PRIOR TO BREAKFAST.  Marland Kitchen testosterone cypionate (DEPOTESTOSTERONE CYPIONATE) 200 MG/ML injection INJECT 0.5 MLS INTO THE SKIN TWICE A WEEK      No flowsheet data found.   Objective:   Today's Vitals: Temp (!) 100.5 F (38.1 C) (Temporal)   Ht 6' (1.829 m)   Wt 209 lb (94.8 kg) Comment: not taken  BMI 28.35 kg/m  Vitals with BMI 01/20/2020 09/24/2019 09/17/2019  Height 6\' 0"  6\' 1"  (No Data)  Weight 209 lbs 203 lbs (No Data)  BMI 15.05 69.79 -  Systolic (No Data) 480 (No Data)  Diastolic (No Data) 80 (No Data)  Pulse - 88 -     Physical Exam  His  voice is hoarse over the phone but he does appear to be alert and orientated.  He does not appear to be dyspneic on the phone.     Assessment   1. Acute non-recurrent maxillary sinusitis       Tests ordered No orders of the defined types were placed in this encounter.    Plan: 1. I think he probably has a sinus infection which started with seasonal allergies.  I will treat him with Zithromax and prednisone to see if this will help him. 2. If he does not improve, he will call us back next week and in the meantime he needs to be out of work for at least 1 week.  I will give him a note for this. 3. I also encouraged him to get COVID-19 vaccination once he is improved from this infection. 4. This phone call lasted 6 minutes and 51 seconds   Meds ordered this encounter  Medications  . azithromycin (ZITHROMAX) 250 MG tablet    Sig: Take 2 tablets the first day and then 1 tablet every day for the next 4 days    Dispense:  6 tablet    Refill:  0  . predniSONE (DELTASONE) 20 MG tablet    Sig: Take 2 tablets (40 mg total) by mouth daily with breakfast.    Dispense:  10 tablet    Refill:  1    Janell Keeling Luther Parody, MD

## 2020-01-25 ENCOUNTER — Encounter (INDEPENDENT_AMBULATORY_CARE_PROVIDER_SITE_OTHER): Payer: Self-pay | Admitting: Internal Medicine

## 2020-01-25 ENCOUNTER — Other Ambulatory Visit: Payer: Self-pay

## 2020-01-25 ENCOUNTER — Ambulatory Visit (INDEPENDENT_AMBULATORY_CARE_PROVIDER_SITE_OTHER): Payer: 59 | Admitting: Internal Medicine

## 2020-01-25 VITALS — BP 140/90 | Temp 98.0°F | Resp 18 | Ht 73.0 in | Wt 213.0 lb

## 2020-01-25 DIAGNOSIS — R079 Chest pain, unspecified: Secondary | ICD-10-CM | POA: Diagnosis not present

## 2020-01-25 DIAGNOSIS — B338 Other specified viral diseases: Secondary | ICD-10-CM | POA: Diagnosis not present

## 2020-01-25 DIAGNOSIS — E291 Testicular hypofunction: Secondary | ICD-10-CM

## 2020-01-25 MED ORDER — PANTOPRAZOLE SODIUM 40 MG PO TBEC: DELAYED_RELEASE_TABLET | ORAL | 1 refills | Status: DC

## 2020-01-25 NOTE — Progress Notes (Signed)
Metrics: Intervention Frequency ACO  Documented Smoking Status Yearly  Screened one or more times in 24 months  Cessation Counseling or  Active cessation medication Past 24 months  Past 24 months   Guideline developer: UpToDate (See UpToDate for funding source) Date Released: 2014       Wellness Office Visit  Subjective:  Patient ID: James Blankenship, male    DOB: [birth year]  Age: 54 y.o. MRN: [id number]  CC: This man comes in for follow-up of diagnosed acute sinusitis few days ago. HPI  He is mostly recovering but he also now tells me that he seems to have a pleuritic type of chest pain which she has been having for several weeks.  He did have a history of COVID-19 disease and a CT scan of the chest was done in March.  This was abnormal but the recommendation was to repeat a CT scan of the chest in 2022. He also describes feeling hot all the time with sweating and this has been going on apparently for several years. He has been taking testosterone therapy without any problems.  The testosterone therapy postdated his symptoms of feeling of hot all over.  It is almost like he was having hot flashes. Past Medical History:  Diagnosis Date  . Gastritis   . GERD (gastroesophageal reflux disease)   . Male hypogonadism 03/23/2019  . Sleep apnea   . Vitamin D deficiency disease 03/23/2019   Past Surgical History:  Procedure Laterality Date  . BIOPSY  11/01/2015   Procedure: BIOPSY;  Surgeon: Danie Binder, MD;  [facility];  Service: Endoscopy;;  gastric  . CHOLECYSTECTOMY    . COLONOSCOPY WITH PROPOFOL N/A 11/01/2015   Dr. Oneida Alar: internal hemorrhoids, otherwise normal.   . ESOPHAGOGASTRODUODENOSCOPY  06/10/2009   Distal esophagea stricture dialted to 81mm. Bx from distal esophagus (neg). erythema in body and antrum.  . ESOPHAGOGASTRODUODENOSCOPY  05/30/2009   distal peptic stricture dialted to 38mm, diffuse bx proven gastritis, no .hpylori  . ESOPHAGOGASTRODUODENOSCOPY (EGD)  WITH PROPOFOL N/A 11/01/2015   Dr. Oneida Alar: benign-appearing esophageal stenosis s/p dilation, chronic gastritis   . HERNIA REPAIR Left    groin  . SAVORY DILATION N/A 11/01/2015   Procedure: SAVORY DILATION;  Surgeon: Danie Binder, MD;  [facility];  Service: Endoscopy;  Laterality: N/A;  . VASECTOMY       Family History  Problem Relation Age of Onset  . Cancer - Other Father        mesothelioma, deceased at age 3   . Colon cancer Neg Hx     Social History   Social History Narrative   Married since 2019,lives with wife and extended family.Works at [employer].   Social History   Tobacco Use  . Smoking status: Never Smoker  . Smokeless tobacco: Never Used  Substance Use Topics  . Alcohol use: No    Alcohol/week: 0.0 standard drinks    Current Meds  Medication Sig  . Cholecalciferol (VITAMIN D-3) 125 MCG (5000 UT) TABS Take 10,000 Units by mouth daily at 12 noon.  . fluticasone (FLONASE) 50 MCG/ACT nasal spray Place 1 spray into both nostrils daily as needed for allergies or rhinitis.  . pantoprazole (PROTONIX) 40 MG tablet TAKE 1 TABLET BY MOUTH EACH MORNING 30 MINUTES PRIOR TO BREAKFAST.  Marland Kitchen testosterone cypionate (DEPOTESTOSTERONE CYPIONATE) 200 MG/ML injection INJECT 0.5 MLS INTO THE SKIN TWICE A WEEK  . [DISCONTINUED] azithromycin (ZITHROMAX) 250 MG tablet Take 2 tablets the first  day and then 1 tablet every day for the next 4 days  . [DISCONTINUED] guaiFENesin-Codeine 200-10 MG/5ML LIQD Take 5 mLs by mouth every 6 (six) hours as needed (cough).  . [DISCONTINUED] ondansetron (ZOFRAN) 4 MG tablet Take 1 tablet (4 mg total) by mouth every 8 (eight) hours as needed for nausea or vomiting.  . [DISCONTINUED] pantoprazole (PROTONIX) 40 MG tablet TAKE 1 TABLET BY MOUTH EACH MORNING 30 MINUTES PRIOR TO BREAKFAST.  . [DISCONTINUED] predniSONE (DELTASONE) 20 MG tablet Take 2 tablets (40 mg total) by mouth daily with breakfast.       No flowsheet data  found.   Objective:   Today's Vitals: BP 140/90 (BP Location: Right Arm, Patient Position: Sitting, Cuff Size: Normal)   Temp 98 F (36.7 C) (Temporal)   Resp 18   Ht 6\' 1"  (1.854 m)   Wt 213 lb (96.6 kg) Comment: been wt: 209lb ;  SpO2 98%   BMI 28.10 kg/m  Vitals with BMI 01/25/2020 01/20/2020 09/24/2019  Height 6\' 1"  6\' 0"  6\' 1"   Weight 213 lbs 209 lbs 203 lbs  BMI 28.11 81.77 11.65  Systolic 790 (No Data) 383  Diastolic 90 (No Data) 80  Pulse - - 88     Physical Exam  He looks systemically well.  He does not have a fever today.  Lung fields are entirely clear with no evidence of pleural rub, crackles or wheezing.     Assessment   1. Chest pain, unspecified type   2. Male hypogonadism   3. Sweating fever       Tests ordered Orders Placed This Encounter  Procedures  . CBC  . T3, free  . T4  . TSH  . COMPLETE METABOLIC PANEL WITH GFR  . Sedimentation Rate  . Ambulatory referral to Pulmonology     Plan: 1. I will do some blood work to see if there are any worrisome features.  I wonder if he has hyperthyroidism based on his symptoms. 2. I will also refer him to pulmonology for further evaluation. 3. Follow-up with me in about 2 to 3 months to see how he is doing. 4. Today I spent 30 minutes with the patient discussing all of his symptoms and possible etiologies.   Meds ordered this encounter  Medications  . pantoprazole (PROTONIX) 40 MG tablet    Sig: TAKE 1 TABLET BY MOUTH EACH MORNING 30 MINUTES PRIOR TO BREAKFAST.    Dispense:  90 tablet    Refill:  1    Robertta Halfhill Luther Parody, MD

## 2020-01-26 LAB — COMPLETE METABOLIC PANEL WITH GFR
AG Ratio: 1.7 (calc) (ref 1.0–2.5)
ALT: 21 U/L (ref 9–46)
AST: 15 U/L (ref 10–35)
Albumin: 4.3 g/dL (ref 3.6–5.1)
Alkaline phosphatase (APISO): 46 U/L (ref 35–144)
BUN/Creatinine Ratio: 17 (calc) (ref 6–22)
BUN: 23 mg/dL (ref 7–25)
CO2: 27 mmol/L (ref 20–32)
Calcium: 9.1 mg/dL (ref 8.6–10.3)
Chloride: 106 mmol/L (ref 98–110)
Creat: 1.37 mg/dL — ABNORMAL HIGH (ref 0.70–1.33)
GFR, Est African American: 67 mL/min/{1.73_m2} (ref >=60)
GFR, Est Non African American: 58 mL/min/{1.73_m2} — ABNORMAL LOW (ref >=60)
Globulin: 2.6 g/dL (calc) (ref 1.9–3.7)
Glucose, Bld: 81 mg/dL (ref 65–99)
Potassium: 4.1 mmol/L (ref 3.5–5.3)
Sodium: 139 mmol/L (ref 135–146)
Total Bilirubin: 0.7 mg/dL (ref 0.2–1.2)
Total Protein: 6.9 g/dL (ref 6.1–8.1)

## 2020-01-26 LAB — T4: T4, Total: 5.5 ug/dL (ref 4.9–10.5)

## 2020-01-26 LAB — CBC
HCT: 46.3 % (ref 38.5–50.0)
Hemoglobin: 15.8 g/dL (ref 13.2–17.1)
MCH: 29.9 pg (ref 27.0–33.0)
MCHC: 34.1 g/dL (ref 32.0–36.0)
MCV: 87.7 fL (ref 80.0–100.0)
MPV: 10.3 fL (ref 7.5–12.5)
Platelets: 237 10*3/uL (ref 140–400)
RBC: 5.28 10*6/uL (ref 4.20–5.80)
RDW: 13.7 % (ref 11.0–15.0)
WBC: 8.7 10*3/uL (ref 3.8–10.8)

## 2020-01-26 LAB — T3, FREE: T3, Free: 3.7 pg/mL (ref 2.3–4.2)

## 2020-01-26 LAB — TSH: TSH: 2.74 mIU/L (ref 0.40–4.50)

## 2020-02-01 ENCOUNTER — Telehealth (INDEPENDENT_AMBULATORY_CARE_PROVIDER_SITE_OTHER): Payer: Self-pay | Admitting: Internal Medicine

## 2020-02-01 ENCOUNTER — Ambulatory Visit: Payer: 59 | Admitting: Pulmonary Disease

## 2020-02-01 ENCOUNTER — Other Ambulatory Visit: Payer: Self-pay

## 2020-02-01 ENCOUNTER — Encounter: Payer: Self-pay | Admitting: Pulmonary Disease

## 2020-02-01 VITALS — BP 136/78 | HR 84 | Temp 98.1°F | Ht 73.0 in | Wt 215.6 lb

## 2020-02-01 DIAGNOSIS — R911 Solitary pulmonary nodule: Secondary | ICD-10-CM

## 2020-02-01 DIAGNOSIS — G4733 Obstructive sleep apnea (adult) (pediatric): Secondary | ICD-10-CM | POA: Diagnosis not present

## 2020-02-01 NOTE — Patient Instructions (Signed)
Obtain CT scan of the chest to compare with previous CT  Follow-up in 3 months  Call with significant concerns

## 2020-02-01 NOTE — Telephone Encounter (Signed)
Yes, he should continue to take the Mucinex and I am glad he is going to see the lung doctor today.

## 2020-02-01 NOTE — Progress Notes (Signed)
Subjective:    Patient ID: James Blankenship, male    DOB: 09-08-1965, 54 y.o.   MRN: [id number]  He was recently in the hospital for Covid pneumonia  He was told at the time that he had an abnormal CT scan of the chest with nodules in his lungs He complains of shortness of breath at rest, feels he has to take multiple breaths Denies any significant shortness of breath on exertion  It was limited for about 5 to 6 weeks following his Covid diagnosis  Was able to return to work recently Developed sinus infection for which he recently completed antibiotics  I had seen him January 2020 for obstructive sleep apnea He was unable to tolerate CPAP in the past  He still snores heavily according to spouse   Never smoker     Review of Systems  Constitutional: Negative for fever and unexpected weight change.  HENT: Negative for congestion, dental problem, ear pain, nosebleeds, postnasal drip, rhinorrhea, sinus pressure, sneezing, sore throat and trouble swallowing.   Eyes: Negative for redness and itching.  Respiratory: Positive for cough and shortness of breath. Negative for chest tightness and wheezing.   Cardiovascular: Negative for palpitations and leg swelling.  Gastrointestinal: Negative for nausea and vomiting.  Genitourinary: Negative for dysuria.  Musculoskeletal: Negative for joint swelling.  Skin: Negative for rash.  Allergic/Immunologic: Positive for environmental allergies. Negative for food allergies and immunocompromised state.  Neurological: Positive for headaches.  Hematological: Does not bruise/bleed easily.  Psychiatric/Behavioral: Negative for dysphoric mood. The patient is not nervous/anxious.    Past Medical History:  Diagnosis Date  . Gastritis   . GERD (gastroesophageal reflux disease)   . Male hypogonadism 03/23/2019  . Sleep apnea   . Vitamin D deficiency disease 03/23/2019   Social History   Socioeconomic History  . Marital status: Married    Spouse  name: Not on file  . Number of children: 2  . Years of education: Not on file  . Highest education level: Not on file  Occupational History    Employer: [employer]  Tobacco Use  . Smoking status: Never Smoker  . Smokeless tobacco: Never Used  Vaping Use  . Vaping Use: Never used  Substance and Sexual Activity  . Alcohol use: No    Alcohol/week: 0.0 standard drinks  . Drug use: No  . Sexual activity: Yes    Birth control/protection: Surgical  Other Topics Concern  . Not on file  Social History Narrative   Married since 2019,lives with wife and extended family.Works at [employer].   Social Determinants of Health   Financial Resource Strain:   . Difficulty of Paying Living Expenses:   Food Insecurity:   . Worried About Charity fundraiser in the Last Year:   . Arboriculturist in the Last Year:   Transportation Needs:   . Film/video editor (Medical):   Marland Kitchen Lack of Transportation (Non-Medical):   Physical Activity:   . Days of Exercise per Week:   . Minutes of Exercise per Session:   Stress:   . Feeling of Stress :   Social Connections:   . Frequency of Communication with Friends and Family:   . Frequency of Social Gatherings with Friends and Family:   . Attends Religious Services:   . Active Member of Clubs or Organizations:   . Attends Archivist Meetings:   Marland Kitchen Marital Status:   Intimate Partner Violence:   . Fear of Current or  Ex-Partner:   . Emotionally Abused:   Marland Kitchen Physically Abused:   . Sexually Abused:    Family History  Problem Relation Age of Onset  . Cancer - Other Father        mesothelioma, deceased at age 71   . Colon cancer Neg Hx        Objective:   Physical Exam Constitutional:      Appearance: Normal appearance.  HENT:     Head: Normocephalic and atraumatic.     Mouth/Throat:     Mouth: Mucous membranes are moist.     Pharynx: No oropharyngeal exudate.  Cardiovascular:     Rate and Rhythm: Normal rate and regular  rhythm.     Pulses: Normal pulses.     Heart sounds: No murmur heard.  No friction rub.  Pulmonary:     Effort: Pulmonary effort is normal. No respiratory distress.     Breath sounds: No stridor. No wheezing.  Musculoskeletal:     Cervical back: Normal range of motion and neck supple. No rigidity. No muscular tenderness.  Neurological:     Mental Status: He is alert.    Vitals:   02/01/20 1603  BP: (!) 136/78  Pulse: 84  Temp: 98.1 F (36.7 C)  SpO2: 98%    Home sleep study from January 2020 did reveal severe obstructive sleep apnea Recent CT scan of the chest reviewed with the patient showing a 2 mm nodule and about 8 mm groundglass nodule on the right  Assessment & Plan:  .  History of severe obstructive sleep apnea -Did not tolerate CPAP -Difficulty with the mask  -We will require repeat study -Order home sleep study  Lung nodules -Unclear whether this was present prior to his recent diagnosis of Covid -Plan will be to follow-up with a repeat CT scan at present  Shortness of breath -Only happens at rest -Not limited with any activities -Previous spirometry has been normal in [place] a home sleep study -Obtain a CT scan of the chest without contrast  Follow-up-In 3 months   Call with significant concerns

## 2020-02-15 ENCOUNTER — Telehealth (INDEPENDENT_AMBULATORY_CARE_PROVIDER_SITE_OTHER): Payer: Self-pay

## 2020-02-15 NOTE — Telephone Encounter (Signed)
I can certainly do a prolonged course of prednisone if he wishes me to do that which I think may give him relief.  Let me know.  The other option is to see him again and we may need to refer him to a specialist.  Let me know what he would like to do.

## 2020-02-16 ENCOUNTER — Other Ambulatory Visit (INDEPENDENT_AMBULATORY_CARE_PROVIDER_SITE_OTHER): Payer: Self-pay | Admitting: Internal Medicine

## 2020-02-16 MED ORDER — PREDNISONE 20 MG PO TABS: 40.0000 mg | ORAL_TABLET | Freq: Every day | ORAL | 1 refills | Status: DC

## 2020-02-16 NOTE — Telephone Encounter (Signed)
Wants to do the prednisone and he goes for the xray soon . If if does not improve then he will come see you after test and you two come up with a plan.  Please send Rx to [facility].

## 2020-02-16 NOTE — Telephone Encounter (Signed)
Okay, I have sent the prednisone to [facility].  Thanks.

## 2020-02-17 ENCOUNTER — Telehealth: Payer: Self-pay | Admitting: Pulmonary Disease

## 2020-02-17 DIAGNOSIS — G4733 Obstructive sleep apnea (adult) (pediatric): Secondary | ICD-10-CM

## 2020-02-17 NOTE — Telephone Encounter (Signed)
Called pt but unable to reach. Left message for him to return call. °

## 2020-02-18 NOTE — Telephone Encounter (Signed)
Called patient to notify him that the order was placed for the in lab split night polysomnogram.

## 2020-02-18 NOTE — Telephone Encounter (Signed)
I called and spoke with the patient about scheduling the home sleep test that Dr. Ander Slade ordered.  He stated that he has 2 CPAP machines at home and not using either of them.   He stated that when he had his last home sleep test Dr. Brett Fairy gave him his last cpap machine. Last home sleep study was done 07/16/2018.  Patient is wondering if he should do an in lab sleep study. Split night, Cpap Titiration because he thinks he needs the Bipap with 02.  Dr. Ander Slade please advise what you think the patient needs.  If he needs in lab sleep study he would like to do it at [facility] on dates of 8/28, 8/29,8/30,8/31 then he will be on vacation

## 2020-02-18 NOTE — Telephone Encounter (Signed)
Schedule the patient for split-night polysomnogram  Note on the request that low threshold for titration to BiPAP as he was not tolerating CPAP previously

## 2020-02-23 ENCOUNTER — Other Ambulatory Visit: Payer: Self-pay

## 2020-02-23 ENCOUNTER — Ambulatory Visit (HOSPITAL_COMMUNITY)
Admission: RE | Admit: 2020-02-23 | Discharge: 2020-02-23 | Disposition: A | Discharge status: Home / Self Care | Payer: 59 | Source: Ambulatory Visit | Attending: Pulmonary Disease | Admitting: Pulmonary Disease

## 2020-02-23 DIAGNOSIS — R911 Solitary pulmonary nodule: Secondary | ICD-10-CM | POA: Diagnosis not present

## 2020-02-25 ENCOUNTER — Telehealth: Payer: Self-pay | Admitting: Pulmonary Disease

## 2020-02-25 NOTE — Telephone Encounter (Signed)
Called and spoke to pt. Pt aware the CT chest results are not yet available. Pt still requests to be called after 4pm to get results.   Dr. Ander Slade please advise on pt's CT chest results. Thanks.

## 2020-02-26 NOTE — Telephone Encounter (Signed)
Left message for patient to call back  

## 2020-02-26 NOTE — Telephone Encounter (Signed)
ATC patient.  LMTCB. 

## 2020-02-26 NOTE — Telephone Encounter (Signed)
Patient called back told him about results of CT from Dr. Ander Slade. He expressed understanding. Nothing further needed at this time.

## 2020-02-26 NOTE — Telephone Encounter (Signed)
Pt returning phone call. Pt can be reached at [phone]. Pt would like to be called at 4pm as he takes a short break from work.

## 2020-02-26 NOTE — Telephone Encounter (Signed)
Kindly call patient with results  CT scan of the chest was reviewed by myself  Stable lung nodule  Repeat in 2 years

## 2020-03-04 ENCOUNTER — Other Ambulatory Visit: Payer: Self-pay

## 2020-03-04 ENCOUNTER — Ambulatory Visit: Payer: 59

## 2020-03-04 DIAGNOSIS — G4733 Obstructive sleep apnea (adult) (pediatric): Secondary | ICD-10-CM | POA: Diagnosis not present

## 2020-03-29 ENCOUNTER — Other Ambulatory Visit: Payer: Self-pay

## 2020-03-29 ENCOUNTER — Ambulatory Visit (INDEPENDENT_AMBULATORY_CARE_PROVIDER_SITE_OTHER): Payer: 59 | Admitting: Internal Medicine

## 2020-03-29 ENCOUNTER — Encounter (INDEPENDENT_AMBULATORY_CARE_PROVIDER_SITE_OTHER): Payer: Self-pay | Admitting: Internal Medicine

## 2020-03-29 VITALS — BP 148/90 | HR 88 | Temp 97.9°F | Resp 18 | Ht 73.2 in | Wt 226.0 lb

## 2020-03-29 DIAGNOSIS — E291 Testicular hypofunction: Secondary | ICD-10-CM

## 2020-03-29 DIAGNOSIS — E559 Vitamin D deficiency, unspecified: Secondary | ICD-10-CM

## 2020-03-29 MED ORDER — PANTOPRAZOLE SODIUM 40 MG PO TBEC: DELAYED_RELEASE_TABLET | ORAL | 1 refills | Status: DC

## 2020-03-29 MED ORDER — TESTOSTERONE CYPIONATE 200 MG/ML IM SOLN: 140.0000 mg | INTRAMUSCULAR | 1 refills | Status: DC

## 2020-03-29 NOTE — Progress Notes (Signed)
Metrics: Intervention Frequency ACO  Documented Smoking Status Yearly  Screened one or more times in 24 months  Cessation Counseling or  Active cessation medication Past 24 months  Past 24 months   Guideline developer: UpToDate (See UpToDate for funding source) Date Released: 2014       Wellness Office Visit  Subjective:  Patient ID: James Blankenship, male    DOB: 1966-04-20  Age: 54 y.o. MRN: [id number]  CC: This man comes in for follow-up of hypogonadism. HPI  The last time I saw him for this issue, I recommended he increase the dose of testosterone which he did.  He has not really noticed any significant improvement in his symptoms.  He still feels very tired but I think that some of this may well have to do with lack of sleep at night and sleep apnea. Past Medical History:  Diagnosis Date  . Gastritis   . GERD (gastroesophageal reflux disease)   . Male hypogonadism 03/23/2019  . Sleep apnea   . Vitamin D deficiency disease 03/23/2019   Past Surgical History:  Procedure Laterality Date  . BIOPSY  11/01/2015   Procedure: BIOPSY;  Surgeon: Danie Binder, MD;  [facility];  Service: Endoscopy;;  gastric  . CHOLECYSTECTOMY    . COLONOSCOPY WITH PROPOFOL N/A 11/01/2015   Dr. Oneida Alar: internal hemorrhoids, otherwise normal.   . ESOPHAGOGASTRODUODENOSCOPY  06/10/2009   Distal esophagea stricture dialted to 86mm. Bx from distal esophagus (neg). erythema in body and antrum.  . ESOPHAGOGASTRODUODENOSCOPY  05/30/2009   distal peptic stricture dialted to 61mm, diffuse bx proven gastritis, no .hpylori  . ESOPHAGOGASTRODUODENOSCOPY (EGD) WITH PROPOFOL N/A 11/01/2015   Dr. Oneida Alar: benign-appearing esophageal stenosis s/p dilation, chronic gastritis   . HERNIA REPAIR Left    groin  . SAVORY DILATION N/A 11/01/2015   Procedure: SAVORY DILATION;  Surgeon: Danie Binder, MD;  [facility];  Service: Endoscopy;  Laterality: N/A;  . VASECTOMY       Family History  Problem  Relation Age of Onset  . Cancer - Other Father        mesothelioma, deceased at age 39   . Colon cancer Neg Hx     Social History   Social History Narrative   Married since 2019,lives with wife and extended family.Works at [employer].   Social History   Tobacco Use  . Smoking status: Never Smoker  . Smokeless tobacco: Never Used  Substance Use Topics  . Alcohol use: No    Alcohol/week: 0.0 standard drinks    Current Meds  Medication Sig  . Cholecalciferol (VITAMIN D-3) 125 MCG (5000 UT) TABS Take 10,000 Units by mouth daily at 12 noon.  . fluticasone (FLONASE) 50 MCG/ACT nasal spray Place 1 spray into both nostrils daily as needed for allergies or rhinitis.  . pantoprazole (PROTONIX) 40 MG tablet TAKE 1 TABLET BY MOUTH EACH MORNING 30 MINUTES PRIOR TO BREAKFAST.  Derrill Memo ON 03/31/2020] testosterone cypionate (DEPOTESTOSTERONE CYPIONATE) 200 MG/ML injection Inject 0.7 mLs (140 mg total) into the muscle 2 (two) times a week.  . [DISCONTINUED] pantoprazole (PROTONIX) 40 MG tablet TAKE 1 TABLET BY MOUTH EACH MORNING 30 MINUTES PRIOR TO BREAKFAST.  . [DISCONTINUED] predniSONE (DELTASONE) 20 MG tablet Take 2 tablets (40 mg total) by mouth daily with breakfast.  . [DISCONTINUED] testosterone cypionate (DEPOTESTOSTERONE CYPIONATE) 200 MG/ML injection INJECT 0.5 MLS INTO THE SKIN TWICE A WEEK (Patient taking differently: Inject 120 mg into the muscle 2 (two) times  a week. )      No flowsheet data found.   Objective:   Today's Vitals: BP (!) 148/90   Pulse 88   Temp 97.9 F (36.6 C) (Temporal)   Resp 18   Ht 6' 1.2" (1.859 m)   Wt 226 lb (102.5 kg)   SpO2 97%   BMI 29.65 kg/m  Vitals with BMI 03/29/2020 02/01/2020 01/25/2020  Height 6' 1.2" 6\' 1"  6\' 1"   Weight 226 lbs 215 lbs 10 oz 213 lbs  BMI 29.66 77.03 40.35  Systolic 248 185 909  Diastolic 90 78 90  Pulse 88 84 -     Physical Exam  He remains overweight.  No other new physical findings today apart from  increased blood pressure today.     Assessment   1. Male hypogonadism   2. Vitamin D deficiency disease       Tests ordered No orders of the defined types were placed in this encounter.    Plan: 1. I have urged him to follow-up with pulmonology regarding his sleep apnea and he will do this. 2. As far as his hypogonadism is concerned, I recommended that he increase the dose of testosterone so that he is taking 140 mg intramuscular twice a week now. 3. Follow-up in the next several weeks to see how he is doing and we will likely check levels then.   Meds ordered this encounter  Medications  . testosterone cypionate (DEPOTESTOSTERONE CYPIONATE) 200 MG/ML injection    Sig: Inject 0.7 mLs (140 mg total) into the muscle 2 (two) times a week.    Dispense:  10 mL    Refill:  1  . pantoprazole (PROTONIX) 40 MG tablet    Sig: TAKE 1 TABLET BY MOUTH EACH MORNING 30 MINUTES PRIOR TO BREAKFAST.    Dispense:  90 tablet    Refill:  1    Tamarra Geiselman Luther Parody, MD

## 2020-03-30 ENCOUNTER — Telehealth: Payer: Self-pay | Admitting: Pulmonary Disease

## 2020-03-30 DIAGNOSIS — G4733 Obstructive sleep apnea (adult) (pediatric): Secondary | ICD-10-CM

## 2020-03-30 NOTE — Telephone Encounter (Signed)
Pt was called with sleep study results and cpap  Titration recommended

## 2020-03-30 NOTE — Telephone Encounter (Signed)
Call patient  Sleep study result  Date of study: 03/05/2020  Impression: Severe obstructive sleep apnea  severe oxygen desaturations   Recommendation:  In lab titration study recommended for severe obstructive sleep apnea with severe oxygen desaturations  CPAP therapy for severe sleep disordered breathing is recommended, patient will likely require oxygen supplementation with therapy and this can be best ascertained with an in lab study  Insurance companies will not pay for oxygen to be added to a CPAP system unless it is titrated in the lab  Follow-up as previously scheduled

## 2020-05-02 ENCOUNTER — Encounter (HOSPITAL_COMMUNITY): Payer: Self-pay | Admitting: Emergency Medicine

## 2020-05-02 ENCOUNTER — Emergency Department (HOSPITAL_COMMUNITY): Payer: 59

## 2020-05-02 ENCOUNTER — Emergency Department (HOSPITAL_COMMUNITY)
Admission: EM | Admit: 2020-05-02 | Discharge: 2020-05-03 | Disposition: A | Discharge status: Home / Self Care | Payer: 59 | Attending: Emergency Medicine | Admitting: Emergency Medicine

## 2020-05-02 ENCOUNTER — Other Ambulatory Visit: Payer: Self-pay

## 2020-05-02 DIAGNOSIS — R0789 Other chest pain: Secondary | ICD-10-CM | POA: Insufficient documentation

## 2020-05-02 DIAGNOSIS — R030 Elevated blood-pressure reading, without diagnosis of hypertension: Secondary | ICD-10-CM | POA: Insufficient documentation

## 2020-05-02 DIAGNOSIS — R202 Paresthesia of skin: Secondary | ICD-10-CM | POA: Insufficient documentation

## 2020-05-02 LAB — BASIC METABOLIC PANEL
Anion gap: 11 (ref 5–15)
BUN: 16 mg/dL (ref 6–20)
CO2: 25 mmol/L (ref 22–32)
Calcium: 9 mg/dL (ref 8.9–10.3)
Chloride: 101 mmol/L (ref 98–111)
Creatinine, Ser: 1.39 mg/dL — ABNORMAL HIGH (ref 0.61–1.24)
GFR, Estimated: >60 mL/min (ref >60)
Glucose, Bld: 98 mg/dL (ref 70–99)
Potassium: 4.1 mmol/L (ref 3.5–5.1)
Sodium: 137 mmol/L (ref 135–145)

## 2020-05-02 LAB — CBC
HCT: 46.7 % (ref 39.0–52.0)
Hemoglobin: 15.9 g/dL (ref 13.0–17.0)
MCH: 31.2 pg (ref 26.0–34.0)
MCHC: 34 g/dL (ref 30.0–36.0)
MCV: 91.7 fL (ref 80.0–100.0)
Platelets: 225 10*3/uL (ref 150–400)
RBC: 5.09 MIL/uL (ref 4.22–5.81)
RDW: 15.1 % (ref 11.5–15.5)
WBC: 7.1 10*3/uL (ref 4.0–10.5)
nRBC: 0 % (ref 0.0–0.2)

## 2020-05-02 LAB — TROPONIN I (HIGH SENSITIVITY): Troponin I (High Sensitivity): 4 ng/L (ref <18)

## 2020-05-02 NOTE — ED Triage Notes (Signed)
Pt c/o chest tightness and tingling in bilateral arm that started at work tonight.

## 2020-05-02 NOTE — ED Provider Notes (Signed)
[facility] EMERGENCY DEPARTMENT Provider Note   CSN: [id number] Arrival date & time: 05/02/20  2102     History Chief Complaint  Patient presents with  . Chest Pain    James Blankenship is a 54 y.o. male.  HPI  HPI: A 54 year old patient with a history of hypertension presents for evaluation of chest pain. Initial onset of pain was approximately 3-6 hours ago. The patient's chest pain is described as heaviness/pressure/tightness and is not worse with exertion. The patient's chest pain is not middle- or left-sided, is not well-localized, is not sharp and does not radiate to the arms/jaw/neck. The patient does not complain of nausea and denies diaphoresis. The patient has no history of stroke, has no history of peripheral artery disease, has not smoked in the past 90 days, denies any history of treated diabetes, has no relevant family history of coronary artery disease (first degree relative at less than age 23), has no history of hypercholesterolemia and does not have an elevated BMI (>=30).   This a 54 year old male who presents with bilateral hand tingling and chest pressure.  Patient reports that he was sitting down at work when he noted tingling in his bilateral hands.  He states that it radiated upwards and into his chest.  He describes pressure in his chest at that time.  Nothing seems to make it better or worse.  It was nonexertional in nature.  He went to the nurse at his work and then noted his blood pressure to be elevated.  He reports that his blood pressure occasionally is elevated but has not been diagnosed with hypertension and is not on any blood pressure medications.  No history of diabetes, hyperlipidemia, smoking.  Denies any recent shortness of breath, cough, fevers.  No known sick contacts or Covid exposures.  Patient reports symptom onset around 7 PM.  He went home and symptoms persisted.  Right now he is only complaining of some anterior chest "soreness" which is "minor."  He  did not take anything for his symptoms.  Past Medical History:  Diagnosis Date  . Gastritis   . GERD (gastroesophageal reflux disease)   . Male hypogonadism 03/23/2019  . Sleep apnea   . Vitamin D deficiency disease 03/23/2019    Patient Active Problem List   Diagnosis Date Noted  . Vitamin D deficiency disease 03/23/2019  . Male hypogonadism 03/23/2019  . Sleep apnea   . Chronic nasal congestion 07/23/2018  . Circadian rhythm sleep disorder, shift work type 07/23/2018  . Overbite, excessive 07/23/2018  . Diaphoresis 07/23/2018  . Chronic intermittent hypoxia with obstructive sleep apnea 07/23/2018  . Severe obstructive sleep apnea 07/23/2018  . Dysphagia 09/28/2015  . Encounter for screening colonoscopy 09/28/2015  . AP (abdominal pain) 09/28/2015  . DYSPHAGIA 04/19/2010  . GERD 10/07/2009    Past Surgical History:  Procedure Laterality Date  . BIOPSY  11/01/2015   Procedure: BIOPSY;  Surgeon: Danie Binder, MD;  [facility];  Service: Endoscopy;;  gastric  . CHOLECYSTECTOMY    . COLONOSCOPY WITH PROPOFOL N/A 11/01/2015   Dr. Oneida Alar: internal hemorrhoids, otherwise normal.   . ESOPHAGOGASTRODUODENOSCOPY  06/10/2009   Distal esophagea stricture dialted to 33mm. Bx from distal esophagus (neg). erythema in body and antrum.  . ESOPHAGOGASTRODUODENOSCOPY  05/30/2009   distal peptic stricture dialted to 71mm, diffuse bx proven gastritis, no .hpylori  . ESOPHAGOGASTRODUODENOSCOPY (EGD) WITH PROPOFOL N/A 11/01/2015   Dr. Oneida Alar: benign-appearing esophageal stenosis s/p dilation, chronic gastritis   .  HERNIA REPAIR Left    groin  . SAVORY DILATION N/A 11/01/2015   Procedure: SAVORY DILATION;  Surgeon: Danie Binder, MD;  [facility];  Service: Endoscopy;  Laterality: N/A;  . VASECTOMY         Family History  Problem Relation Age of Onset  . Cancer - Other Father        mesothelioma, deceased at age 68   . Colon cancer Neg Hx     Social History    Tobacco Use  . Smoking status: Never Smoker  . Smokeless tobacco: Never Used  Vaping Use  . Vaping Use: Never used  Substance Use Topics  . Alcohol use: No    Alcohol/week: 0.0 standard drinks  . Drug use: No    Home Medications Prior to Admission medications   Medication Sig Start Date End Date Taking? Authorizing Provider  Cholecalciferol (VITAMIN D-3) 125 MCG (5000 UT) TABS Take 10,000 Units by mouth daily at 12 noon.    [provider]  fluticasone (FLONASE) 50 MCG/ACT nasal spray Place 1 spray into both nostrils daily as needed for allergies or rhinitis. 09/10/19   Petrucelli, Samantha R, PA-C  pantoprazole (PROTONIX) 40 MG tablet TAKE 1 TABLET BY MOUTH EACH MORNING 30 MINUTES PRIOR TO BREAKFAST. 03/29/20   Doree Albee, MD  testosterone cypionate (DEPOTESTOSTERONE CYPIONATE) 200 MG/ML injection Inject 0.7 mLs (140 mg total) into the muscle 2 (two) times a week. 03/31/20   Doree Albee, MD    Allergies    Penicillins  Review of Systems   Review of Systems  Constitutional: Negative for fever.  Respiratory: Negative for cough and shortness of breath.   Cardiovascular: Positive for chest pain.  Gastrointestinal: Negative for abdominal pain, nausea and vomiting.  Neurological: Negative for weakness and numbness.       Hand paresthesias  All other systems reviewed and are negative.   Physical Exam Updated Vital Signs BP (!) 147/100   Pulse 62   Temp 98.6 F (37 C) (Oral)   Resp 14   Ht 1.854 m (6\' 1" )   Wt 97.1 kg   SpO2 93%   BMI 28.23 kg/m   Physical Exam Vitals and nursing note reviewed.  Constitutional:      Appearance: He is well-developed. He is not ill-appearing.  HENT:     Head: Normocephalic and atraumatic.  Eyes:     Pupils: Pupils are equal, round, and reactive to light.  Cardiovascular:     Rate and Rhythm: Normal rate and regular rhythm.     Heart sounds: Normal heart sounds. No murmur heard.   Pulmonary:     Effort:  Pulmonary effort is normal. No respiratory distress.     Breath sounds: Normal breath sounds. No wheezing.  Abdominal:     General: Bowel sounds are normal.     Palpations: Abdomen is soft.     Tenderness: There is no abdominal tenderness. There is no rebound.  Musculoskeletal:     Cervical back: Neck supple.  Lymphadenopathy:     Cervical: No cervical adenopathy.  Skin:    General: Skin is warm and dry.  Neurological:     Mental Status: He is alert and oriented to person, place, and time.  Psychiatric:        Mood and Affect: Mood normal.     ED Results / Procedures / Treatments   Labs (all labs ordered are listed, but only abnormal results are displayed) Labs Reviewed  BASIC  METABOLIC PANEL - Abnormal; Notable for the following components:      Result Value   Creatinine, Ser 1.39 (*)    All other components within normal limits  CBC  TROPONIN I (HIGH SENSITIVITY)  TROPONIN I (HIGH SENSITIVITY)    EKG EKG Interpretation  Date/Time:  Monday May 02 2020 21:02:45 EDT Ventricular Rate:  91 PR Interval:  148 QRS Duration: 76 QT Interval:  310 QTC Calculation: 381 R Axis:   70 Text Interpretation: Normal sinus rhythm Minimal voltage criteria for LVH, may be normal variant ( Sokolow-Lyon ) Borderline ECG Confirmed by Thayer Jew [phone]) on 05/02/2020 11:03:18 PM   Radiology DG Chest 2 View  Result Date: 05/02/2020 CLINICAL DATA:  Chest pain. EXAM: CHEST - 2 VIEW COMPARISON:  Radiograph 09/10/2019.  Chest CT 02/23/2020 FINDINGS: The cardiomediastinal contours are normal. Small ground-glass nodule on prior chest CT is not well seen by radiograph. Pulmonary vasculature is normal. No consolidation, pleural effusion, or pneumothorax. No acute osseous abnormalities are seen. Thoracic spondylosis. IMPRESSION: No acute chest findings. Electronically Signed   By: Keith Rake M.D.   On: 05/02/2020 21:36    Procedures Procedures (including critical care  time)  Medications Ordered in ED Medications - No data to display  ED Course  I have reviewed the triage vital signs and the nursing notes.  Pertinent labs & imaging results that were available during my care of the patient were reviewed by me and considered in my medical decision making (see chart for details).    MDM Rules/Calculators/A&P HEAR Score: 3                        Patient presents with bilateral hand paresthesias and chest pressure.  He is overall nontoxic-appearing and vital signs are notable for elevated blood pressures initially 172/102.  This declined during hospitalization.  Pain is fairly atypical and is nonexertional.  EKG shows no evidence of acute ischemia or arrhythmia.  He is not significantly tachycardic.  Considerations include but not limited to, ACS, infectious etiology such as pneumonia or pneumothorax, less likely PE.  Troponin x2 flat at 4.  Chest x-Darell shows no evidence of pneumothorax or pneumonia and this was independently reviewed by myself.  Of note, aside from age, he would be PERC negative.  Given absence of tachycardia and no risk factors or leg swelling, doubt PE.  He was noted to have O2 sats ranging 92 to 95% but this was when he was resting and would return to 100% with arousal and talking.  He per report has significant sleep apnea and I feel this is likely the explanation for these borderline O2 sats.  Patient and wife reassured.  His heart score is 3 and feel he is a candidate for outpatient cardiology evaluation.  They are agreeable to plan.  After history, exam, and medical workup I feel the patient has been appropriately medically screened and is safe for discharge home. Pertinent diagnoses were discussed with the patient. Patient was given return precautions.  Final Clinical Impression(s) / ED Diagnoses Final diagnoses:  Atypical chest pain    Rx / DC Orders ED Discharge Orders    None       Farah Benish, Barbette Hair, MD 05/03/20 [phone]

## 2020-05-03 LAB — TROPONIN I (HIGH SENSITIVITY): Troponin I (High Sensitivity): 4 ng/L (ref <18)

## 2020-05-03 NOTE — Discharge Instructions (Addendum)
You were seen today for chest pain.  Your work-up is largely reassuring.  You do need functional study as an outpatient.  Call cardiology for follow-up.  If you have any new or worsening symptoms, you should be reevaluated immediately.

## 2020-05-05 ENCOUNTER — Ambulatory Visit: Payer: 59 | Admitting: Internal Medicine

## 2020-05-05 NOTE — Progress Notes (Deleted)
Cardiology Office Note:    Date:  05/05/2020   ID:  James Blankenship, DOB Mar 18, 1966, MRN [id number]  PCP:  Doree Albee, MD  [place] Cardiologist:  No primary care provider on file.  [facility] Electrophysiologist:  None   CC: Consulted for the evaluation of chest pressure at the behest of [place], Doristine Johns, MD  History of Present Illness:    James Blankenship is a 54 y.o. male with a hx of OSA, HTN, who presented with chest pressure without exertion, seen in the ED 05/02/20.    Past Medical History:  Diagnosis Date  . AP (abdominal pain)   . Chest pain   . Chest pressure   . Chronic intermittent hypoxia with obstructive sleep apnea   . Chronic nasal congestion   . Circadian rhythm sleep disorder   . Diaphoresis   . Dysphagia   . Gastritis   . GERD (gastroesophageal reflux disease)   . GERD (gastroesophageal reflux disease)   . Hand tingling   . Male hypogonadism 03/23/2019  . Overbite   . Sleep apnea   . Vitamin D deficiency disease 03/23/2019    Past Surgical History:  Procedure Laterality Date  . BIOPSY  11/01/2015   Procedure: BIOPSY;  Surgeon: Danie Binder, MD;  [facility];  Service: Endoscopy;;  gastric  . CHOLECYSTECTOMY    . COLONOSCOPY WITH PROPOFOL N/A 11/01/2015   Dr. Oneida Alar: internal hemorrhoids, otherwise normal.   . ESOPHAGOGASTRODUODENOSCOPY  06/10/2009   Distal esophagea stricture dialted to 53mm. Bx from distal esophagus (neg). erythema in body and antrum.  . ESOPHAGOGASTRODUODENOSCOPY  05/30/2009   distal peptic stricture dialted to 35mm, diffuse bx proven gastritis, no .hpylori  . ESOPHAGOGASTRODUODENOSCOPY (EGD) WITH PROPOFOL N/A 11/01/2015   Dr. Oneida Alar: benign-appearing esophageal stenosis s/p dilation, chronic gastritis   . HERNIA REPAIR Left    groin  . SAVORY DILATION N/A 11/01/2015   Procedure: SAVORY DILATION;  Surgeon: Danie Binder, MD;  [facility];  Service: Endoscopy;  Laterality: N/A;  . VASECTOMY        Current Medications: No outpatient medications have been marked as taking for the 05/05/20 encounter (Appointment) with Werner Lean, MD.     Allergies:   Penicillins   Social History   Socioeconomic History  . Marital status: Married    Spouse name: Not on file  . Number of children: 2  . Years of education: Not on file  . Highest education level: Not on file  Occupational History    Employer: [employer]  Tobacco Use  . Smoking status: Never Smoker  . Smokeless tobacco: Never Used  Vaping Use  . Vaping Use: Never used  Substance and Sexual Activity  . Alcohol use: No    Alcohol/week: 0.0 standard drinks  . Drug use: No  . Sexual activity: Yes    Birth control/protection: Surgical  Other Topics Concern  . Not on file  Social History Narrative   Married since 2019,lives with wife and extended family.Works at [employer].   Social Determinants of Health   Financial Resource Strain:   . Difficulty of Paying Living Expenses: Not on file  Food Insecurity:   . Worried About Charity fundraiser in the Last Year: Not on file  . Ran Out of Food in the Last Year: Not on file  Transportation Needs:   . Lack of Transportation (Medical): Not on file  . Lack of Transportation (Non-Medical): Not on file  Physical Activity:   . Days of Exercise per Week: Not on file  . Minutes of Exercise per Session: Not on file  Stress:   . Feeling of Stress : Not on file  Social Connections:   . Frequency of Communication with Friends and Family: Not on file  . Frequency of Social Gatherings with Friends and Family: Not on file  . Attends Religious Services: Not on file  . Active Member of Clubs or Organizations: Not on file  . Attends Archivist Meetings: Not on file  . Marital Status: Not on file     Family History: The patient's ***family history includes Cancer - Other in his father. There is no history of Colon cancer. Family history *** ROS:    Please see the history of present illness.    *** All other systems reviewed and are negative.  EKGs/Labs/Other Studies Reviewed:    The following studies were reviewed today: ***  EKG:  EKG is ordered today.  The ekg ordered today demonstrates sinus  05/02/20:  Sinus rhythm rate 91 with LVH  Recent Labs: 01/25/2020: ALT 21; TSH 2.74 05/02/2020: BUN 16; Creatinine, Ser 1.39; Hemoglobin 15.9; Platelets 225; Potassium 4.1; Sodium 137  Recent Lipid Panel    Component Value Date/Time   CHOL 180 03/23/2019 1654   TRIG 132 03/23/2019 1654   HDL 38 (L) 03/23/2019 1654   CHOLHDL 4.7 03/23/2019 1654   LDLCALC 117 (H) 03/23/2019 1654    Physical Exam:    VS:  There were no vitals taken for this visit.    Wt Readings from Last 3 Encounters:  05/02/20 214 lb (97.1 kg)  03/29/20 226 lb (102.5 kg)  02/01/20 (!) 215 lb 9.6 oz (97.8 kg)    GEN: *** Well nourished, well developed in no acute distress HEENT: Normal NECK: No JVD; No carotid bruits LYMPHATICS: No lymphadenopathy CARDIAC: ***RRR, no murmurs, rubs, gallops RESPIRATORY:  Clear to auscultation without rales, wheezing or rhonchi  ABDOMEN: Soft, non-tender, non-distended MUSCULOSKELETAL:  No edema; No deformity  SKIN: Warm and dry NEUROLOGIC:  Alert and oriented x 3 PSYCHIATRIC:  Normal affect   ASSESSMENT:    No diagnosis found. PLAN:    In order of problems listed above:  Chest Pain syndrome   Medication Adjustments/Labs and Tests Ordered: Current medicines are reviewed at length with the patient today.  Concerns regarding medicines are outlined above.  No orders of the defined types were placed in this encounter.  No orders of the defined types were placed in this encounter.   There are no Patient Instructions on file for this visit.   Signed, Werner Lean, MD  05/05/2020 12:47 PM    [place]

## 2020-05-10 ENCOUNTER — Encounter (INDEPENDENT_AMBULATORY_CARE_PROVIDER_SITE_OTHER): Payer: Self-pay | Admitting: Internal Medicine

## 2020-05-10 ENCOUNTER — Ambulatory Visit (INDEPENDENT_AMBULATORY_CARE_PROVIDER_SITE_OTHER): Payer: 59 | Admitting: Internal Medicine

## 2020-05-10 ENCOUNTER — Other Ambulatory Visit: Payer: Self-pay

## 2020-05-10 VITALS — BP 140/84 | HR 90 | Temp 98.1°F | Ht 73.2 in | Wt 232.4 lb

## 2020-05-10 DIAGNOSIS — R079 Chest pain, unspecified: Secondary | ICD-10-CM | POA: Diagnosis not present

## 2020-05-10 DIAGNOSIS — R5381 Other malaise: Secondary | ICD-10-CM

## 2020-05-10 DIAGNOSIS — G4730 Sleep apnea, unspecified: Secondary | ICD-10-CM | POA: Diagnosis not present

## 2020-05-10 DIAGNOSIS — R5383 Other fatigue: Secondary | ICD-10-CM

## 2020-05-10 DIAGNOSIS — R232 Flushing: Secondary | ICD-10-CM

## 2020-05-10 NOTE — Progress Notes (Signed)
Metrics: Intervention Frequency ACO  Documented Smoking Status Yearly  Screened one or more times in 24 months  Cessation Counseling or  Active cessation medication Past 24 months  Past 24 months   Guideline developer: UpToDate (See UpToDate for funding source) Date Released: 2014       Wellness Office Visit  Subjective:  Patient ID: James Blankenship, male    DOB: [birth year]  Age: 54 y.o. MRN: [id number]  CC: This man was in the emergency room recently because of paresthesia in his arms followed by chest tightness. HPI  Investigation in the emergency room approximately 1 week ago did not reveal any major abnormalities but he apparently was referred to cardiology although I do not see an appointment in the schedule. He also complains of various bumps on his skin on his arms and trunk which seem to become worse with increasing of testosterone dose. He constantly seems to complain of "hot flashes".  He denies any weight loss.  In fact he is having trouble weight losing weight, mostly due to dietary indiscretion. He also complains of fatigue and daytime sleepiness which I am sure is related to his sleep apnea.  This is being investigated. Past Medical History:  Diagnosis Date  . AP (abdominal pain)   . Chest pain   . Chest pressure   . Chronic intermittent hypoxia with obstructive sleep apnea   . Chronic nasal congestion   . Circadian rhythm sleep disorder   . Diaphoresis   . Dysphagia   . Gastritis   . GERD (gastroesophageal reflux disease)   . GERD (gastroesophageal reflux disease)   . Hand tingling   . Male hypogonadism 03/23/2019  . Overbite   . Sleep apnea   . Vitamin D deficiency disease 03/23/2019   Past Surgical History:  Procedure Laterality Date  . BIOPSY  11/01/2015   Procedure: BIOPSY;  Surgeon: Danie Binder, MD;  [facility];  Service: Endoscopy;;  gastric  . CHOLECYSTECTOMY    . COLONOSCOPY WITH PROPOFOL N/A 11/01/2015   Dr. Oneida Alar: internal hemorrhoids,  otherwise normal.   . ESOPHAGOGASTRODUODENOSCOPY  06/10/2009   Distal esophagea stricture dialted to 31mm. Bx from distal esophagus (neg). erythema in body and antrum.  . ESOPHAGOGASTRODUODENOSCOPY  05/30/2009   distal peptic stricture dialted to 57mm, diffuse bx proven gastritis, no .hpylori  . ESOPHAGOGASTRODUODENOSCOPY (EGD) WITH PROPOFOL N/A 11/01/2015   Dr. Oneida Alar: benign-appearing esophageal stenosis s/p dilation, chronic gastritis   . HERNIA REPAIR Left    groin  . SAVORY DILATION N/A 11/01/2015   Procedure: SAVORY DILATION;  Surgeon: Danie Binder, MD;  [facility];  Service: Endoscopy;  Laterality: N/A;  . VASECTOMY       Family History  Problem Relation Age of Onset  . Cancer - Other Father        mesothelioma, deceased at age 24   . Colon cancer Neg Hx     Social History   Social History Narrative   Married since 2019,lives with wife and extended family.Works at [employer].   Social History   Tobacco Use  . Smoking status: Never Smoker  . Smokeless tobacco: Never Used  Substance Use Topics  . Alcohol use: No    Alcohol/week: 0.0 standard drinks    Current Meds  Medication Sig  . Cholecalciferol (VITAMIN D-3) 125 MCG (5000 UT) TABS Take 10,000 Units by mouth daily at 12 noon.  . fluticasone (FLONASE) 50 MCG/ACT nasal spray Place 1 spray into both nostrils  daily as needed for allergies or rhinitis.  . pantoprazole (PROTONIX) 40 MG tablet TAKE 1 TABLET BY MOUTH EACH MORNING 30 MINUTES PRIOR TO BREAKFAST.  Marland Kitchen testosterone cypionate (DEPOTESTOSTERONE CYPIONATE) 200 MG/ML injection Inject 0.7 mLs (140 mg total) into the muscle 2 (two) times a week.  . valACYclovir (VALTREX) 1000 MG tablet Take 1,000 mg by mouth daily.       No flowsheet data found.   Objective:   Today's Vitals: BP 140/84   Pulse 90   Temp 98.1 F (36.7 C) (Temporal)   Ht 6' 1.2" (1.859 m)   Wt 232 lb 6.4 oz (105.4 kg)   SpO2 98%   BMI 30.49 kg/m  Vitals with BMI  05/10/2020 05/03/2020 05/03/2020  Height 6' 1.2" - -  Weight 232 lbs 6 oz - -  BMI 75.6 - -  Systolic 433 295 -  Diastolic 84 188 -  Pulse 90 62 57     Physical Exam  He remains obese.  Blood pressure borderline elevated today.  Alert and orientated without any focal neurological signs.     Assessment   1. Chest pain, unspecified type   2. Malaise and fatigue   3. Hot flashes   4. Sleep apnea, unspecified type       Tests ordered Orders Placed This Encounter  Procedures  . CBC  . C-reactive protein  . Sedimentation rate     Plan: 1. I will do some blood work today to make sure there were no other causes of hot flashes in the man and make sure his sed rate is normal as well as his C-reactive protein. 2. I have told him to discontinue testosterone therapy for the next couple of weeks and he can start it back up again at the same dose but only once a week now.  Hopefully his folliculitis will improve. 3. Await further evaluation for sleep apnea. 4. Await cardiology investigation and evaluation. 5. Follow-up in 3 months.  He clearly will need time away from work and I will fill out form reflecting this because of his current medical symptoms and concerns.   No orders of the defined types were placed in this encounter.   Doree Albee, MD

## 2020-05-11 LAB — CBC
HCT: 47.1 % (ref 38.5–50.0)
Hemoglobin: 16 g/dL (ref 13.2–17.1)
MCH: 30.4 pg (ref 27.0–33.0)
MCHC: 34 g/dL (ref 32.0–36.0)
MCV: 89.4 fL (ref 80.0–100.0)
MPV: 10.6 fL (ref 7.5–12.5)
Platelets: 220 10*3/uL (ref 140–400)
RBC: 5.27 10*6/uL (ref 4.20–5.80)
RDW: 15 % (ref 11.0–15.0)
WBC: 7.9 10*3/uL (ref 3.8–10.8)

## 2020-05-11 LAB — SEDIMENTATION RATE: Sed Rate: 2 mm/h (ref 0–20)

## 2020-05-11 LAB — C-REACTIVE PROTEIN: CRP: 6 mg/L (ref <8.0)

## 2020-05-15 ENCOUNTER — Other Ambulatory Visit: Payer: Self-pay

## 2020-05-15 ENCOUNTER — Ambulatory Visit (HOSPITAL_BASED_OUTPATIENT_CLINIC_OR_DEPARTMENT_OTHER): Payer: 59 | Attending: Pulmonary Disease | Admitting: Pulmonary Disease

## 2020-05-15 DIAGNOSIS — Z9989 Dependence on other enabling machines and devices: Secondary | ICD-10-CM | POA: Insufficient documentation

## 2020-05-15 DIAGNOSIS — G4737 Central sleep apnea in conditions classified elsewhere: Secondary | ICD-10-CM | POA: Diagnosis not present

## 2020-05-15 DIAGNOSIS — G4733 Obstructive sleep apnea (adult) (pediatric): Secondary | ICD-10-CM

## 2020-05-17 ENCOUNTER — Telehealth: Payer: Self-pay | Admitting: Pulmonary Disease

## 2020-05-17 DIAGNOSIS — G4733 Obstructive sleep apnea (adult) (pediatric): Secondary | ICD-10-CM | POA: Diagnosis not present

## 2020-05-17 NOTE — Procedures (Signed)
POLYSOMNOGRAPHY  Last, First: James, Blankenship MRN: [id number] Gender: Male Age (years): 54 Weight (lbs): 232 DOB: [birth year] BMI: 31 Primary Care: No PCP Epworth Score: 20 Referring: James Blankenship Technician: James Blankenship Interpreting: James Blankenship Study Type: BiPAP Ordered Study Type: CPAP Study date: 05/15/2020 Location: [place] CLINICAL INFORMATION James Blankenship is a 54 year old Male and was referred to the sleep center for evaluation of G47.33 OSA: Adult and Pediatric (327.23). Indications include OSA.  MEDICATIONS Patient self administered medications include: N/A. Medications administered during study include No sleep medicine administered.  SLEEP STUDY TECHNIQUE The patient underwent an attended overnight polysomnography titration to assess the effects of BIPAP therapy. The following variables were monitored: EEG(C4-A1, C3-A2, O1-A2, O2-A1), EOG, submental and leg EMG, ECG, oxyhemoglobin saturation by pulse oximetry, thoracic and abdominal respiratory effort belts, nasal/oral airflow by pressure sensor, body position sensor and snoring sensor. BIPAP pressure was titrated to eliminate apneas, hypopneas and oxygen desaturation. Hypopneas were scored per AASM definition IB (4% desaturation)  TECHNICIAN COMMENTS Comments added by Technician: PATIENT SWITCHED TO BIPAP ST Comments added by Scorer: N/A SLEEP ARCHITECTURE The study was initiated at 10:42:45 PM and terminated at 4:58:30 AM. Total recorded time was 375.8 minutes. EEG confirmed total sleep time was 356.5 minutes yielding a sleep efficiency of 94.9%%. Sleep onset after lights out was 1.2 minutes with a REM latency of 60.5 minutes. The patient spent 7.4%% of the night in stage N1 sleep, 52.3%% in stage N2 sleep, 0.0%% in stage N3 and 40.3% in REM. The Arousal Index was 7.4/hour. RESPIRATORY PARAMETERS The overall AHI was 9.6 per hour, and the RDI was 9.9 events/hour with a central apnea index of 2.2per hour.  The most appropriate setting of BiPAP was 24/20 cm H2O. At this setting, the sleep efficiency was 94 % and the patient was supine for 60%. The AHI was 0.0 events per hour, and the RDI was 0.0 events/hour (with 2.2 central events) and the arousal index was 0.7 per hour.The oxygen nadir was 94.0% during sleep.    The cumulative time under 88% oxygen saturation was 5.5 minutes  LEG MOVEMENT DATA The total leg movements were 0 with a resulting leg movement index of 0.0. Associated arousal with leg movement index was 0.0. CARDIAC DATA The underlying cardiac rhythm was most consistent with sinus rhythm. Mean heart rate during sleep was 66.5 bpm. Additional rhythm abnormalities include None.   IMPRESSIONS - Mild Obstructive Sleep apnea(OSA) Optimal pressure attained. - EKG showed no cardiac abnormalities. - Moderate Oxygen Desaturation - The patient snored with loud snoring volume. - No significant periodic leg movements(PLMs) during sleep. However, no significant associated arousals. - Very good sleep quality with prominent REM rebound on optimal pressures.   DIAGNOSIS - CPAP induced central apneas - Obstructive Sleep Apnea (G47.33)   RECOMMENDATIONS - Trial of BiPAP ST therapy on 24/20 cm H2O with a backup rate of 10 with a Medium size Fisher&Paykel Full Face Mask Simplus mask and heated humidification. - Avoid alcohol, sedatives and other CNS depressants that may worsen sleep apnea and disrupt normal sleep architecture. - Sleep hygiene should be reviewed to assess factors that may improve sleep quality. - Weight management and regular exercise should be initiated or continued. - Return to Sleep Center for re-evaluation after 4 weeks of therapy  [Electronically signed] 05/17/2020 05:21 AM  James Blankenship NPI: [id number]

## 2020-05-17 NOTE — Telephone Encounter (Signed)
Call patient  Sleep study result  Date of study: 05/15/2020  Impression: Obstructive sleep apnea with CPAP induced central sleep apnea  Recommendation: DME referral  - Trial of BiPAP ST therapy on 24/20 cm H2O with a backup rate of 10 with a Medium size Fisher&Paykel Full Face Mask Simplus mask and heated humidification.  Encourage weight loss measures  Follow-up in the office 4 to 6 weeks following initiation of treatment

## 2020-05-17 NOTE — Telephone Encounter (Signed)
Attempted to call pt but unable to reach and unable to leave VM due to mailbox being full. Will try to call back later. 

## 2020-05-18 ENCOUNTER — Encounter: Payer: Self-pay | Admitting: *Deleted

## 2020-05-18 NOTE — Telephone Encounter (Signed)
Tried calling the pt. He still did not answer and no option to leave msg so I have mailed him a letter.

## 2020-05-19 ENCOUNTER — Encounter: Payer: Self-pay | Admitting: Internal Medicine

## 2020-05-19 ENCOUNTER — Ambulatory Visit: Payer: 59 | Admitting: Internal Medicine

## 2020-05-19 ENCOUNTER — Other Ambulatory Visit: Payer: Self-pay

## 2020-05-19 ENCOUNTER — Encounter: Payer: Self-pay | Admitting: *Deleted

## 2020-05-19 VITALS — BP 136/80 | HR 90 | Ht 73.0 in | Wt 228.4 lb

## 2020-05-19 DIAGNOSIS — G4733 Obstructive sleep apnea (adult) (pediatric): Secondary | ICD-10-CM | POA: Diagnosis not present

## 2020-05-19 DIAGNOSIS — E785 Hyperlipidemia, unspecified: Secondary | ICD-10-CM

## 2020-05-19 DIAGNOSIS — R0602 Shortness of breath: Secondary | ICD-10-CM

## 2020-05-19 DIAGNOSIS — R079 Chest pain, unspecified: Secondary | ICD-10-CM | POA: Diagnosis not present

## 2020-05-19 DIAGNOSIS — I10 Essential (primary) hypertension: Secondary | ICD-10-CM

## 2020-05-19 NOTE — Patient Instructions (Signed)
Medication Instructions:  Your physician recommends that you continue on your current medications as directed. Please refer to the Current Medication list given to you today.  *If you need a refill on your cardiac medications before your next appointment, please call your pharmacy*   Lab Work: None If you have labs (blood work) drawn today and your tests are completely normal, you will receive your results only by: Marland Kitchen MyChart Message (if you have MyChart) OR . A paper copy in the mail If you have any lab test that is abnormal or we need to change your treatment, we will call you to review the results.   Testing/Procedures: Your physician has requested that you have an echocardiogram. Echocardiography is a painless test that uses sound waves to create images of your heart. It provides your doctor with information about the size and shape of your heart and how well your heart's chambers and valves are working. This procedure takes approximately one hour. There are no restrictions for this procedure.    Follow-Up: At [facility], you and your health needs are our priority.  As part of our continuing mission to provide you with exceptional heart care, we have created designated Provider Care Teams.  These Care Teams include your primary Cardiologist (physician) and Advanced Practice Providers (APPs -  Physician Assistants and Nurse Practitioners) who all work together to provide you with the care you need, when you need it.  We recommend signing up for the patient portal called "MyChart".  Sign up information is provided on this After Visit Summary.  MyChart is used to connect with patients for Virtual Visits (Telemedicine).  Patients are able to view lab/test results, encounter notes, upcoming appointments, etc.  Non-urgent messages can be sent to your provider as well.   To learn more about what you can do with MyChart, go to [email or web address].    Your next appointment:   4-5  month(s)  The format for your next appointment:   In Person  Provider:   You may see Rudean Haskell, MD or one of the following Advanced Practice Providers on your designated Care Team:    Melina Copa, PA-C  Ermalinda Barrios, PA-C    Other Instructions

## 2020-05-19 NOTE — Progress Notes (Signed)
Cardiology Office Note:    Date:  05/19/2020   ID:  James Blankenship, DOB [birth year], MRN [id number]  PCP:  Doree Albee, MD  [place] Cardiologist:  No primary care provider on file.  [facility] Electrophysiologist:  None   Referring MD: Doree Albee, MD   CC: Chest Pain Consulted for the evaluation of chest pain at the behest of Doree Albee, MD   History of Present Illness:    James Blankenship is a 54 y.o. male with a hx of Obstructive Sleep Apnea just completed sleep study, HTN who presented to the ED 05/02/20 with chest pain.    Patient originally presented 05/02/20 with chest pressure at rest without radiation.  Novel troponin testing was within normal limits.  Was scheduled for outpatient f/u.  Patient notes that without exertion, he had tingling cessation in both arms.  This pain went from his arms to his chest.  Had chest pressure.  Went to the ED had BP as high as 170 bilaterally.  No other incidents of chest pressure.  Notes occasional chest tightness as rest.  No change in exertional component:  The chest pressure and chest tightness. As separate and have no provoking or relieving factors Notes gradual weight gain, no sudden weight gain.  Notes bendopnea.  Notes orthopnea for a long time- has been seen by ENT because of concerns for jaw dysmorphia and related sleep issues..  Past Medical History:  Diagnosis Date  . AP (abdominal pain)   . Chest pain   . Chest pressure   . Chronic intermittent hypoxia with obstructive sleep apnea   . Chronic nasal congestion   . Circadian rhythm sleep disorder   . Diaphoresis   . Dysphagia   . Gastritis   . GERD (gastroesophageal reflux disease)   . GERD (gastroesophageal reflux disease)   . Hand tingling   . Male hypogonadism 03/23/2019  . Overbite   . Sleep apnea   . Vitamin D deficiency disease 03/23/2019    Past Surgical History:  Procedure Laterality Date  . BIOPSY  11/01/2015   Procedure: BIOPSY;   Surgeon: Danie Binder, MD;  [facility];  Service: Endoscopy;;  gastric  . CHOLECYSTECTOMY    . COLONOSCOPY WITH PROPOFOL N/A 11/01/2015   Dr. Oneida Alar: internal hemorrhoids, otherwise normal.   . ESOPHAGOGASTRODUODENOSCOPY  06/10/2009   Distal esophagea stricture dialted to 82mm. Bx from distal esophagus (neg). erythema in body and antrum.  . ESOPHAGOGASTRODUODENOSCOPY  05/30/2009   distal peptic stricture dialted to 38mm, diffuse bx proven gastritis, no .hpylori  . ESOPHAGOGASTRODUODENOSCOPY (EGD) WITH PROPOFOL N/A 11/01/2015   Dr. Oneida Alar: benign-appearing esophageal stenosis s/p dilation, chronic gastritis   . HERNIA REPAIR Left    groin  . SAVORY DILATION N/A 11/01/2015   Procedure: SAVORY DILATION;  Surgeon: Danie Binder, MD;  [facility];  Service: Endoscopy;  Laterality: N/A;  . VASECTOMY     Current Medications: Current Meds  Medication Sig  . Cholecalciferol (VITAMIN D-3) 125 MCG (5000 UT) TABS Take 10,000 Units by mouth daily at 12 noon.  . fluticasone (FLONASE) 50 MCG/ACT nasal spray Place 1 spray into both nostrils daily as needed for allergies or rhinitis.  . pantoprazole (PROTONIX) 40 MG tablet TAKE 1 TABLET BY MOUTH EACH MORNING 30 MINUTES PRIOR TO BREAKFAST.  Marland Kitchen prednisoLONE acetate (PRED FORTE) 1 % ophthalmic suspension Place 1 drop into the right eye 3 (three) times a week.  . testosterone cypionate (DEPOTESTOSTERONE  CYPIONATE) 200 MG/ML injection Inject 0.7 mLs (140 mg total) into the muscle 2 (two) times a week.  . valACYclovir (VALTREX) 1000 MG tablet Take 1,000 mg by mouth daily.      Allergies:   Penicillins   Social History   Socioeconomic History  . Marital status: Married    Spouse name: Not on file  . Number of children: 2  . Years of education: Not on file  . Highest education level: Not on file  Occupational History    Employer: [employer]  Tobacco Use  . Smoking status: Never Smoker  . Smokeless tobacco: Never Used    Vaping Use  . Vaping Use: Never used  Substance and Sexual Activity  . Alcohol use: No    Alcohol/week: 0.0 standard drinks  . Drug use: No  . Sexual activity: Yes    Birth control/protection: Surgical  Other Topics Concern  . Not on file  Social History Narrative   Married since 2019,lives with wife and extended family.Works at [employer].   Social Determinants of Health   Financial Resource Strain:   . Difficulty of Paying Living Expenses: Not on file  Food Insecurity:   . Worried About Charity fundraiser in the Last Year: Not on file  . Ran Out of Food in the Last Year: Not on file  Transportation Needs:   . Lack of Transportation (Medical): Not on file  . Lack of Transportation (Non-Medical): Not on file  Physical Activity:   . Days of Exercise per Week: Not on file  . Minutes of Exercise per Session: Not on file  Stress:   . Feeling of Stress : Not on file  Social Connections:   . Frequency of Communication with Friends and Family: Not on file  . Frequency of Social Gatherings with Friends and Family: Not on file  . Attends Religious Services: Not on file  . Active Member of Clubs or Organizations: Not on file  . Attends Archivist Meetings: Not on file  . Marital Status: Not on file    Family History: The patient's family history includes Cancer - Other in his father. There is no history of Colon cancer. No heart disease family.  ROS:   Please see the history of present illness.     All other systems reviewed and are negative.  EKGs/Labs/Other Studies Reviewed:    The following studies were reviewed today:  EKG:   05/03/20: Sinus 90 LVH  Recent Labs: 01/25/2020: ALT 21; TSH 2.74 05/02/2020: BUN 16; Creatinine, Ser 1.39; Potassium 4.1; Sodium 137 05/10/2020: Hemoglobin 16.0; Platelets 220  Recent Lipid Panel    Component Value Date/Time   CHOL 180 03/23/2019 1654   TRIG 132 03/23/2019 1654   HDL 38 (L) 03/23/2019 1654   CHOLHDL 4.7  03/23/2019 1654   LDLCALC 117 (H) 03/23/2019 1654   Personally reviewed 02/23/20-> CT chest wo Contrast.  No coronary calcium; no aortic aneurysm.  Performed for eval for SPN monitoring  Physical Exam:    VS:  BP 136/80   Pulse 90   Ht 6\' 1"  (1.854 m)   Wt 228 lb 6.4 oz (103.6 kg)   SpO2 98%   BMI 30.13 kg/m     Wt Readings from Last 3 Encounters:  05/19/20 228 lb 6.4 oz (103.6 kg)  05/15/20 232 lb (105.2 kg)  05/10/20 232 lb 6.4 oz (105.4 kg)    GEN: Well nourished, well developed in no acute distress HEENT: Normal NECK: No  JVD; No carotid bruits LYMPHATICS: No lymphadenopathy CARDIAC: RRR, no murmurs, rubs, gallops RESPIRATORY:  Clear to auscultation without rales, wheezing or rhonchi  ABDOMEN: Soft, non-tender, abdominal hernia noted MUSCULOSKELETAL:  No edema; No deformity  SKIN: Warm and dry NEUROLOGIC:  Alert and oriented x 3 PSYCHIATRIC:  Normal affect   ASSESSMENT:    1. Chest pain of uncertain etiology   2. Essential hypertension   3. OSA (obstructive sleep apnea)   4. Hyperlipidemia, unspecified hyperlipidemia type   5. SOB (shortness of breath)    PLAN:    In order of problems listed above:  Chest Pain Syndrome Resting shortness of breath Hypertension OSA HLD ASCVD 6.5% Anxiety - The patient presents with non-cardiac chest pain with no coronary calcium - discussed the risks and benefits of coronary testing; presently will pursue SOB first - Would recommend an echocardiogram - at our next visit, we should check your cholesterol (fasting) - continue home medications - ambulatory blood pressure is not performed- 130/80; will continue monitor (gave education) - agree with sleep titration - discussed diet (DASH/low sodium), and exercise/weight loss interventions   4-5 month follow up unless new symptoms or abnormal test results warranting change in plan  Would be reasonable for  Virtual Follow up Would be reasonable for  APP Follow up   Shared  Decision Making/Informed Consent      Medication Adjustments/Labs and Tests Ordered: Current medicines are reviewed at length with the patient today.  Concerns regarding medicines are outlined above.  Orders Placed This Encounter  Procedures  . ECHOCARDIOGRAM COMPLETE   No orders of the defined types were placed in this encounter.   Patient Instructions  Medication Instructions:  Your physician recommends that you continue on your current medications as directed. Please refer to the Current Medication list given to you today.  *If you need a refill on your cardiac medications before your next appointment, please call your pharmacy*   Lab Work: None If you have labs (blood work) drawn today and your tests are completely normal, you will receive your results only by: Marland Kitchen MyChart Message (if you have MyChart) OR . A paper copy in the mail If you have any lab test that is abnormal or we need to change your treatment, we will call you to review the results.   Testing/Procedures: Your physician has requested that you have an echocardiogram. Echocardiography is a painless test that uses sound waves to create images of your heart. It provides your doctor with information about the size and shape of your heart and how well your heart's chambers and valves are working. This procedure takes approximately one hour. There are no restrictions for this procedure.    Follow-Up: At [facility], you and your health needs are our priority.  As part of our continuing mission to provide you with exceptional heart care, we have created designated Provider Care Teams.  These Care Teams include your primary Cardiologist (physician) and Advanced Practice Providers (APPs -  Physician Assistants and Nurse Practitioners) who all work together to provide you with the care you need, when you need it.  We recommend signing up for the patient portal called "MyChart".  Sign up information is provided on this  After Visit Summary.  MyChart is used to connect with patients for Virtual Visits (Telemedicine).  Patients are able to view lab/test results, encounter notes, upcoming appointments, etc.  Non-urgent messages can be sent to your provider as well.   To learn more about what you  can do with MyChart, go to [email or web address].    Your next appointment:   4-5 month(s)  The format for your next appointment:   In Person  Provider:   You may see Rudean Haskell, MD or one of the following Advanced Practice Providers on your designated Care Team:    Melina Copa, PA-C  Ermalinda Barrios, PA-C    Other Instructions      Signed, Werner Lean, MD  05/19/2020 8:43 AM    [place]

## 2020-05-24 ENCOUNTER — Telehealth: Payer: Self-pay | Admitting: Pulmonary Disease

## 2020-05-24 ENCOUNTER — Telehealth (INDEPENDENT_AMBULATORY_CARE_PROVIDER_SITE_OTHER): Payer: Self-pay

## 2020-05-24 DIAGNOSIS — G4733 Obstructive sleep apnea (adult) (pediatric): Secondary | ICD-10-CM

## 2020-05-24 NOTE — Telephone Encounter (Signed)
Called and spoke with pts wife, Janace Hoard and she is aware of order being placed for BIPAP with [employer]  Nothing further is needed.

## 2020-05-24 NOTE — Telephone Encounter (Signed)
3 consecutive days in 1 month is what my intention was.

## 2020-05-24 NOTE — Telephone Encounter (Signed)
The FMLA forms was accepted. But the 3 day & the frequency a month needs to be explained if he is have back to back days . Or if he is to have intermitted monthly up to 3 days when he needs.   they will accept a letterhead note to return; will not have to redo FMLA forms.

## 2020-05-24 NOTE — Telephone Encounter (Signed)
James Blankenship wife is returning phone call. James Blankenship phone number is [phone].

## 2020-05-24 NOTE — Telephone Encounter (Signed)
Call patient  Sleep study result  Date of study: 05/15/2020  Impression: Obstructive sleep apnea with CPAP induced central sleep apnea  Recommendation: DME referral  - Trial of BiPAP ST therapy on 24/20 cm H2O with a backup rate of 10 with a Medium size Fisher&Paykel Full Face Mask Simplus mask and heated humidification.  Encourage weight loss measures  Follow-up in the office 4 to 6 weeks following initiation of treatment  I have called and lmom for Angie, pts wife to review the sleep study results with her.

## 2020-05-24 NOTE — Telephone Encounter (Signed)
-----   Message from Alfonzo Beers sent at 05/23/2020  1:16 PM EST ----- Regarding: Called about his paperwork I transferred his voicemail to you concerning he needs a note written to specify 3 days in a row or 3 days within a month and faxed to the medical department.

## 2020-05-25 ENCOUNTER — Telehealth: Payer: Self-pay | Admitting: Pulmonary Disease

## 2020-05-25 NOTE — Telephone Encounter (Signed)
Called and spoke with James Blankenship, clarified that the total apneas was 25 which she said qualifies him for a vent and at this time with his reimbursement, [facility] is unable to supply the vent and the orders would have to go to another supplier.  Routed to the [facility] to follow up on sending to another DME company.  PCC's Please find another DME company that can supply vent for patient.  Thank you.

## 2020-05-26 ENCOUNTER — Encounter (INDEPENDENT_AMBULATORY_CARE_PROVIDER_SITE_OTHER): Payer: Self-pay

## 2020-05-26 NOTE — Telephone Encounter (Signed)
Resent order to [facility] to see if they can take it James Blankenship

## 2020-05-31 NOTE — Telephone Encounter (Signed)
[facility] has taken this pt and will get his bipap for him Joellen Jersey

## 2020-06-07 ENCOUNTER — Telehealth (INDEPENDENT_AMBULATORY_CARE_PROVIDER_SITE_OTHER): Payer: 59 | Admitting: Internal Medicine

## 2020-06-07 ENCOUNTER — Encounter (INDEPENDENT_AMBULATORY_CARE_PROVIDER_SITE_OTHER): Payer: Self-pay | Admitting: Internal Medicine

## 2020-06-07 VITALS — BP 129/82

## 2020-06-07 DIAGNOSIS — R0981 Nasal congestion: Secondary | ICD-10-CM | POA: Diagnosis not present

## 2020-06-07 DIAGNOSIS — R5383 Other fatigue: Secondary | ICD-10-CM

## 2020-06-07 DIAGNOSIS — J329 Chronic sinusitis, unspecified: Secondary | ICD-10-CM | POA: Diagnosis not present

## 2020-06-07 DIAGNOSIS — R5381 Other malaise: Secondary | ICD-10-CM | POA: Diagnosis not present

## 2020-06-07 MED ORDER — LEVOFLOXACIN 500 MG PO TABS: 500.0000 mg | ORAL_TABLET | Freq: Every day | ORAL | 0 refills | Status: DC

## 2020-06-07 NOTE — Progress Notes (Signed)
Metrics: Intervention Frequency ACO  Documented Smoking Status Yearly  Screened one or more times in 24 months  Cessation Counseling or  Active cessation medication Past 24 months  Past 24 months   Guideline developer: UpToDate (See UpToDate for funding source) Date Released: 2014       Wellness Office Visit  Subjective:  Patient ID: James Blankenship, male    DOB: June 10, 1966  Age: 54 y.o. MRN: [id number]  CC: This is an audio telemedicine visit with the permission of the patient who is at home and I I am in my office.  I used 2 identifiers to identify the patient. Nasal congestion/facial discomfort. HPI  He has had the above symptoms for the last 24 to 36 hours.  He denies any actual fever but he says he feels cold.  He has some aching of his legs.  He feels very weak.  He denies any dyspnea.  There is no productive cough although he does have somewhat of a dry cough related to postnasal drainage. Past Medical History:  Diagnosis Date  . AP (abdominal pain)   . Chest pain   . Chest pressure   . Chronic intermittent hypoxia with obstructive sleep apnea   . Chronic nasal congestion   . Circadian rhythm sleep disorder   . Diaphoresis   . Dysphagia   . Gastritis   . GERD (gastroesophageal reflux disease)   . GERD (gastroesophageal reflux disease)   . Hand tingling   . Male hypogonadism 03/23/2019  . Overbite   . Sleep apnea   . Vitamin D deficiency disease 03/23/2019   Past Surgical History:  Procedure Laterality Date  . BIOPSY  11/01/2015   Procedure: BIOPSY;  Surgeon: Danie Binder, MD;  [facility];  Service: Endoscopy;;  gastric  . CHOLECYSTECTOMY    . COLONOSCOPY WITH PROPOFOL N/A 11/01/2015   Dr. Oneida Alar: internal hemorrhoids, otherwise normal.   . ESOPHAGOGASTRODUODENOSCOPY  06/10/2009   Distal esophagea stricture dialted to 17mm. Bx from distal esophagus (neg). erythema in body and antrum.  . ESOPHAGOGASTRODUODENOSCOPY  05/30/2009   distal peptic stricture  dialted to 22mm, diffuse bx proven gastritis, no .hpylori  . ESOPHAGOGASTRODUODENOSCOPY (EGD) WITH PROPOFOL N/A 11/01/2015   Dr. Oneida Alar: benign-appearing esophageal stenosis s/p dilation, chronic gastritis   . HERNIA REPAIR Left    groin  . SAVORY DILATION N/A 11/01/2015   Procedure: SAVORY DILATION;  Surgeon: Danie Binder, MD;  Location: [facility];  Service: Endoscopy;  Laterality: N/A;  . VASECTOMY       Family History  Problem Relation Age of Onset  . Cancer - Other Father        mesothelioma, deceased at age 24   . Colon cancer Neg Hx     Social History   Social History Narrative   Married since 2019,lives with wife and extended family.Works at [employer].   Social History   Tobacco Use  . Smoking status: Never Smoker  . Smokeless tobacco: Never Used  Substance Use Topics  . Alcohol use: No    Alcohol/week: 0.0 standard drinks    Current Meds  Medication Sig  . Cholecalciferol (VITAMIN D-3) 125 MCG (5000 UT) TABS Take 10,000 Units by mouth daily at 12 noon.  . fluticasone (FLONASE) 50 MCG/ACT nasal spray Place 1 spray into both nostrils daily as needed for allergies or rhinitis.  . pantoprazole (PROTONIX) 40 MG tablet TAKE 1 TABLET BY MOUTH EACH MORNING 30 MINUTES PRIOR TO BREAKFAST.  Marland Kitchen prednisoLONE  acetate (PRED FORTE) 1 % ophthalmic suspension Place 1 drop into the right eye 3 (three) times a week.  . testosterone cypionate (DEPOTESTOSTERONE CYPIONATE) 200 MG/ML injection Inject 0.7 mLs (140 mg total) into the muscle 2 (two) times a week.  . valACYclovir (VALTREX) 1000 MG tablet Take 1,000 mg by mouth daily.       No flowsheet data found.   Objective:   Today's Vitals: BP 129/82 Comment: not taken  SpO2 95%  Vitals with BMI 06/07/2020 05/19/2020 05/15/2020  Height (No Data) 6\' 1"  6\' 1"   Weight (No Data) 228 lbs 6 oz 232 lbs  BMI - 17.47 15.95  Systolic 396 728 -  Diastolic 82 80 -  Pulse - 90 -     Physical Exam He appears alert and  orientated on the phone.  He does not appear to be in any respiratory distress.  His saturation at home on room air is 95% as noted above.      Assessment   1. Malaise and fatigue   2. Nasal congestion   3. Other sinusitis, unspecified chronicity       Tests ordered No orders of the defined types were placed in this encounter.    Plan: 1. I am going to treat him empirically for sinusitis and will try Levaquin to see if this will help him. 2. If he does not improve, he will let us know. 3. This phone call lasted 5 minutes.   Meds ordered this encounter  Medications  . levofloxacin (LEVAQUIN) 500 MG tablet    Sig: Take 1 tablet (500 mg total) by mouth daily.    Dispense:  7 tablet    Refill:  0    Toussaint Golson Luther Parody, MD

## 2020-06-13 ENCOUNTER — Other Ambulatory Visit: Payer: Self-pay

## 2020-06-13 ENCOUNTER — Ambulatory Visit (HOSPITAL_COMMUNITY): Payer: 59 | Attending: Cardiology

## 2020-06-13 DIAGNOSIS — R0602 Shortness of breath: Secondary | ICD-10-CM | POA: Diagnosis not present

## 2020-06-13 DIAGNOSIS — R079 Chest pain, unspecified: Secondary | ICD-10-CM | POA: Diagnosis not present

## 2020-06-13 LAB — ECHOCARDIOGRAM COMPLETE
Area-P 1/2: 2.68 cm2
S' Lateral: 2.2 cm

## 2020-07-12 ENCOUNTER — Ambulatory Visit
Admission: EM | Admit: 2020-07-12 | Discharge: 2020-07-12 | Disposition: A | Discharge status: Home / Self Care | Payer: 59 | Attending: Family Medicine | Admitting: Family Medicine

## 2020-07-12 ENCOUNTER — Encounter: Payer: Self-pay | Admitting: Emergency Medicine

## 2020-07-12 DIAGNOSIS — R0982 Postnasal drip: Secondary | ICD-10-CM

## 2020-07-12 DIAGNOSIS — R519 Headache, unspecified: Secondary | ICD-10-CM

## 2020-07-12 DIAGNOSIS — R059 Cough, unspecified: Secondary | ICD-10-CM

## 2020-07-12 DIAGNOSIS — J029 Acute pharyngitis, unspecified: Secondary | ICD-10-CM

## 2020-07-12 DIAGNOSIS — B349 Viral infection, unspecified: Secondary | ICD-10-CM

## 2020-07-12 DIAGNOSIS — R6883 Chills (without fever): Secondary | ICD-10-CM

## 2020-07-12 DIAGNOSIS — R5383 Other fatigue: Secondary | ICD-10-CM

## 2020-07-12 MED ORDER — DEXAMETHASONE SODIUM PHOSPHATE 10 MG/ML IJ SOLN
10.0000 mg | Freq: Once | INTRAMUSCULAR | Status: AC
  Administered 2020-07-12: 10 mg via INTRAMUSCULAR

## 2020-07-12 MED ORDER — CHERATUSSIN AC 100-10 MG/5ML PO SOLN
5.0000 mL | Freq: Three times a day (TID) | ORAL | 0 refills | Status: DC | PRN
Start: 2020-07-12 — End: 2020-08-10

## 2020-07-12 NOTE — Discharge Instructions (Addendum)
Your COVID and Flu tests are pending.  You should self quarantine until the test results are back.    You have received a steroid injection in the office today  I have sent in cough syrup for you to take. This medication can make you sleepy. Do not drive while taking this medication.  Take Tylenol or ibuprofen as needed for fever or discomfort.  Rest and keep yourself hydrated.    Follow-up with your primary care provider if your symptoms are not improving.

## 2020-07-12 NOTE — ED Triage Notes (Signed)
C/o headache, sore throat , chills.

## 2020-07-14 LAB — COVID-19, FLU A+B NAA
Influenza A, NAA: NOT DETECTED
Influenza B, NAA: NOT DETECTED
SARS-CoV-2, NAA: DETECTED — AB

## 2020-07-15 ENCOUNTER — Other Ambulatory Visit (INDEPENDENT_AMBULATORY_CARE_PROVIDER_SITE_OTHER): Payer: Self-pay | Admitting: Internal Medicine

## 2020-08-10 ENCOUNTER — Other Ambulatory Visit: Payer: Self-pay

## 2020-08-10 ENCOUNTER — Ambulatory Visit (INDEPENDENT_AMBULATORY_CARE_PROVIDER_SITE_OTHER): Payer: 59 | Admitting: Internal Medicine

## 2020-08-10 ENCOUNTER — Encounter (INDEPENDENT_AMBULATORY_CARE_PROVIDER_SITE_OTHER): Payer: Self-pay | Admitting: Internal Medicine

## 2020-08-10 VITALS — BP 138/90 | HR 89 | Temp 98.1°F | Ht 73.0 in | Wt 231.0 lb

## 2020-08-10 DIAGNOSIS — L989 Disorder of the skin and subcutaneous tissue, unspecified: Secondary | ICD-10-CM

## 2020-08-10 DIAGNOSIS — G4730 Sleep apnea, unspecified: Secondary | ICD-10-CM | POA: Diagnosis not present

## 2020-08-10 DIAGNOSIS — E291 Testicular hypofunction: Secondary | ICD-10-CM | POA: Diagnosis not present

## 2020-08-10 DIAGNOSIS — E559 Vitamin D deficiency, unspecified: Secondary | ICD-10-CM | POA: Diagnosis not present

## 2020-08-10 NOTE — Progress Notes (Signed)
Metrics: Intervention Frequency ACO  Documented Smoking Status Yearly  Screened one or more times in 24 months  Cessation Counseling or  Active cessation medication Past 24 months  Past 24 months   Guideline developer: UpToDate (See UpToDate for funding source) Date Released: 2014       Wellness Office Visit  Subjective:  Patient ID: James Blankenship, male    DOB: 11/30/1965  Age: 55 y.o. MRN: [id number]  CC: This man comes in for follow-up of testosterone therapy, vitamin D deficiency, sleep apnea. HPI  He is concerned about skin lesions on his right forearm.  We agreed to reduce testosterone to once a week thinking that this may have had something to do with it but he is not so sure now. He is frustrated that he has not had any information regarding his sleep study and further treatment.  In the past, he has seen neurology and Dr. Brett Fairy had recommended possible inspire procedure.  He would like to pursue this further. He also was recently diagnosed with COVID-19 disease again but thankfully has improved. Past Medical History:  Diagnosis Date  . AP (abdominal pain)   . Chest pain   . Chest pressure   . Chronic intermittent hypoxia with obstructive sleep apnea   . Chronic nasal congestion   . Circadian rhythm sleep disorder   . Diaphoresis   . Dysphagia   . Gastritis   . GERD (gastroesophageal reflux disease)   . GERD (gastroesophageal reflux disease)   . Hand tingling   . Male hypogonadism 03/23/2019  . Overbite   . Sleep apnea   . Vitamin D deficiency disease 03/23/2019   Past Surgical History:  Procedure Laterality Date  . BIOPSY  11/01/2015   Procedure: BIOPSY;  Surgeon: Danie Binder, MD;  [facility];  Service: Endoscopy;;  gastric  . CHOLECYSTECTOMY    . COLONOSCOPY WITH PROPOFOL N/A 11/01/2015   Dr. Oneida Alar: internal hemorrhoids, otherwise normal.   . ESOPHAGOGASTRODUODENOSCOPY  06/10/2009   Distal esophagea stricture dialted to 58mm. Bx from distal  esophagus (neg). erythema in body and antrum.  . ESOPHAGOGASTRODUODENOSCOPY  05/30/2009   distal peptic stricture dialted to 26mm, diffuse bx proven gastritis, no .hpylori  . ESOPHAGOGASTRODUODENOSCOPY (EGD) WITH PROPOFOL N/A 11/01/2015   Dr. Oneida Alar: benign-appearing esophageal stenosis s/p dilation, chronic gastritis   . HERNIA REPAIR Left    groin  . SAVORY DILATION N/A 11/01/2015   Procedure: SAVORY DILATION;  Surgeon: Danie Binder, MD;  [facility];  Service: Endoscopy;  Laterality: N/A;  . VASECTOMY       Family History  Problem Relation Age of Onset  . Cancer - Other Father        mesothelioma, deceased at age 58   . Colon cancer Neg Hx     Social History   Social History Narrative   Married since 2019,lives with wife and extended family.Works at [employer].   Social History   Tobacco Use  . Smoking status: Never Smoker  . Smokeless tobacco: Never Used  Substance Use Topics  . Alcohol use: No    Alcohol/week: 0.0 standard drinks    Current Meds  Medication Sig  . Cholecalciferol (VITAMIN D-3) 125 MCG (5000 UT) TABS Take 10,000 Units by mouth daily at 12 noon.  . fluticasone (FLONASE) 50 MCG/ACT nasal spray Place 1 spray into both nostrils daily as needed for allergies or rhinitis.  . pantoprazole (PROTONIX) 40 MG tablet TAKE 1 TABLET BY MOUTH EACH  MORNING 30 MINUTES PRIOR TO BREAKFAST.  Marland Kitchen testosterone cypionate (DEPOTESTOSTERONE CYPIONATE) 200 MG/ML injection INJECT 0.5 MLS INTO THE MUSCLE TWICE WEEKLY (Patient taking differently: Inject 100 mg into the muscle once a week.)  . valACYclovir (VALTREX) 1000 MG tablet Take 1,000 mg by mouth daily.       Depression screen [facility] 2/9 08/10/2020  Decreased Interest 0  Down, Depressed, Hopeless 0  PHQ - 2 Score 0  Altered sleeping 0  Tired, decreased energy 0  Change in appetite 0  Feeling bad or failure about yourself  0  Trouble concentrating 0  Moving slowly or fidgety/restless 0  Suicidal thoughts  0  PHQ-9 Score 0  Difficult doing work/chores Not difficult at all     Objective:   Today's Vitals: BP 138/90   Pulse 89   Temp 98.1 F (36.7 C) (Temporal)   Ht 6\' 1"  (1.854 m)   Wt 231 lb (104.8 kg)   SpO2 97%   BMI 30.48 kg/m  Vitals with BMI 08/10/2020 07/12/2020 06/07/2020  Height 6\' 1"  6\' 1"  (No Data)  Weight 231 lbs 214 lbs (No Data)  BMI 28.36 62.94 -  Systolic 765 465 035  Diastolic 90 87 82  Pulse 89 96 -     Physical Exam  He remains obese.  Blood pressure borderline elevated. He has 2 distinct skin lesions with some erythema, each measuring approximately 1 cm in diameter.  I am not sure what these represent.     Assessment   1. Male hypogonadism   2. Vitamin D deficiency disease   3. Sleep apnea, unspecified type   4. Skin lesion of right arm       Tests ordered Orders Placed This Encounter  Procedures  . Ambulatory referral to Dermatology     Plan: 1. We discussed testosterone therapy again and I would like him to go back to try taking testosterone twice a week at a dose of 100 mg intramuscular twice a week. 2. He will continue with vitamin D3 supplementation. 3. I will send a message to Dr. Brett Fairy regarding who I should refer the patient to regarding the inspire procedure. 4. I will send him to dermatology, Dr. Denna Haggard for his skin lesions. 5. Follow-up in 3 months.   No orders of the defined types were placed in this encounter.   Doree Albee, MD

## 2020-08-11 ENCOUNTER — Ambulatory Visit (INDEPENDENT_AMBULATORY_CARE_PROVIDER_SITE_OTHER): Payer: 59 | Admitting: Internal Medicine

## 2020-08-11 ENCOUNTER — Other Ambulatory Visit (INDEPENDENT_AMBULATORY_CARE_PROVIDER_SITE_OTHER): Payer: Self-pay | Admitting: Internal Medicine

## 2020-08-11 ENCOUNTER — Encounter (INDEPENDENT_AMBULATORY_CARE_PROVIDER_SITE_OTHER): Payer: Self-pay | Admitting: Internal Medicine

## 2020-08-11 DIAGNOSIS — G4730 Sleep apnea, unspecified: Secondary | ICD-10-CM

## 2020-09-11 ENCOUNTER — Encounter (HOSPITAL_COMMUNITY): Payer: Self-pay | Admitting: Emergency Medicine

## 2020-09-11 ENCOUNTER — Emergency Department (HOSPITAL_COMMUNITY)
Admission: EM | Admit: 2020-09-11 | Discharge: 2020-09-11 | Disposition: A | Discharge status: Home / Self Care | Payer: 59 | Attending: Emergency Medicine | Admitting: Emergency Medicine

## 2020-09-11 ENCOUNTER — Other Ambulatory Visit: Payer: Self-pay

## 2020-09-11 ENCOUNTER — Emergency Department (HOSPITAL_COMMUNITY): Payer: 59

## 2020-09-11 DIAGNOSIS — R300 Dysuria: Secondary | ICD-10-CM | POA: Diagnosis not present

## 2020-09-11 DIAGNOSIS — R739 Hyperglycemia, unspecified: Secondary | ICD-10-CM | POA: Insufficient documentation

## 2020-09-11 DIAGNOSIS — I10 Essential (primary) hypertension: Secondary | ICD-10-CM | POA: Insufficient documentation

## 2020-09-11 DIAGNOSIS — R1011 Right upper quadrant pain: Secondary | ICD-10-CM

## 2020-09-11 LAB — CBC WITH DIFFERENTIAL/PLATELET
Abs Immature Granulocytes: 0.04 10*3/uL (ref 0.00–0.07)
Basophils Absolute: 0.1 10*3/uL (ref 0.0–0.1)
Basophils Relative: 1 %
Eosinophils Absolute: 0.1 10*3/uL (ref 0.0–0.5)
Eosinophils Relative: 2 %
HCT: 47.3 % (ref 39.0–52.0)
Hemoglobin: 15.8 g/dL (ref 13.0–17.0)
Immature Granulocytes: 1 %
Lymphocytes Relative: 25 %
Lymphs Abs: 1.6 10*3/uL (ref 0.7–4.0)
MCH: 30.7 pg (ref 26.0–34.0)
MCHC: 33.4 g/dL (ref 30.0–36.0)
MCV: 92 fL (ref 80.0–100.0)
Monocytes Absolute: 0.7 10*3/uL (ref 0.1–1.0)
Monocytes Relative: 11 %
Neutro Abs: 4 10*3/uL (ref 1.7–7.7)
Neutrophils Relative %: 60 %
Platelets: 200 10*3/uL (ref 150–400)
RBC: 5.14 MIL/uL (ref 4.22–5.81)
RDW: 15.4 % (ref 11.5–15.5)
WBC: 6.5 10*3/uL (ref 4.0–10.5)
nRBC: 0 % (ref 0.0–0.2)

## 2020-09-11 LAB — COMPREHENSIVE METABOLIC PANEL
ALT: 42 U/L (ref 0–44)
AST: 24 U/L (ref 15–41)
Albumin: 4 g/dL (ref 3.5–5.0)
Alkaline Phosphatase: 45 U/L (ref 38–126)
Anion gap: 9 (ref 5–15)
BUN: 18 mg/dL (ref 6–20)
CO2: 23 mmol/L (ref 22–32)
Calcium: 8.6 mg/dL — ABNORMAL LOW (ref 8.9–10.3)
Chloride: 104 mmol/L (ref 98–111)
Creatinine, Ser: 1.33 mg/dL — ABNORMAL HIGH (ref 0.61–1.24)
GFR, Estimated: >60 mL/min (ref >60)
Glucose, Bld: 103 mg/dL — ABNORMAL HIGH (ref 70–99)
Potassium: 4.2 mmol/L (ref 3.5–5.1)
Sodium: 136 mmol/L (ref 135–145)
Total Bilirubin: 0.7 mg/dL (ref 0.3–1.2)
Total Protein: 7.3 g/dL (ref 6.5–8.1)

## 2020-09-11 LAB — URINALYSIS, ROUTINE W REFLEX MICROSCOPIC
Bilirubin Urine: NEGATIVE
Glucose, UA: NEGATIVE mg/dL
Ketones, ur: NEGATIVE mg/dL
Nitrite: NEGATIVE
Protein, ur: 100 mg/dL — AB
Specific Gravity, Urine: 1.02 (ref 1.005–1.030)
pH: 5 (ref 5.0–8.0)

## 2020-09-11 MED ORDER — NITROFURANTOIN MONOHYD MACRO 100 MG PO CAPS: 100.0000 mg | ORAL_CAPSULE | Freq: Two times a day (BID) | ORAL | 0 refills | Status: DC

## 2020-09-11 NOTE — ED Triage Notes (Signed)
Pt presents to ED with complaints of right sided abdominal pain. Pt states he has a hernia. Pt states he sneezed yesterday and noticed pain got worse. Pt also complaining of bilateral ankle swelling.

## 2020-09-11 NOTE — Discharge Instructions (Addendum)
I suspect you are suffering from a muscular strain after your sneeze.  I recommend over-the-counter pain medications like ibuprofen and or Tylenol every 6 hours as needed please follow dosing on the back of bottle.  Your urine does appear to be abnormal suspect you may have a UTI.  I have started you on antibiotics please take as prescribed.  Please remember to stay hydrated to help flush out the infection.  I would like you to follow-up with your primary care doctor in 1 week's time for repeat of your urine to ensure that infection has cleared up.  I also like to follow-up with general surgery for your ventral hernia if it continues to bother you.  Come back to the emergency department if you develop chest pain, shortness of breath, severe abdominal pain, uncontrolled nausea, vomiting, diarrhea.

## 2020-09-11 NOTE — ED Provider Notes (Signed)
[facility] EMERGENCY DEPARTMENT Provider Note   CSN: [id number] Arrival date & time: 09/11/20  1016     History Chief Complaint  Patient presents with   Abdominal Pain    James Blankenship is a 55 y.o. male.  HPI   Patient with significant medical history of sleep apnea, hypogonadism presents with chief complaint of right upper quadrant pain/flank pain.  He endorses yesterday he was out cleaning and dust got into his nose causing him to sneeze.  He had a sharp pain in his right side, it radiated into his right flank.  He states the pain was initially severe but has decreased in severity,  pain is elicited with movement, sneezing or coughing.  He has no associated nausea, vomiting, diarrhea, constipation, passing flatus.  He does endorse that he has had some dysuria with urination, denies urgency, frequency, hematuria, testicular pain or penile discharge.  He has no history of kidney stones, UTIs or pyelonephritis.  Patient has significant abdominal history of left inguinal hernia repair, lap chole, he denies history of Marfan's disease or aortic aneurysms.  Patient denies alleviating factors.  Patient denies headaches, fevers, chills, shortness of breath, chest pain, worsening pedal edema.  Past Medical History:  Diagnosis Date   AP (abdominal pain)    Chest pain    Chest pressure    Chronic intermittent hypoxia with obstructive sleep apnea    Chronic nasal congestion    Circadian rhythm sleep disorder    Diaphoresis    Dysphagia    Gastritis    GERD (gastroesophageal reflux disease)    GERD (gastroesophageal reflux disease)    Hand tingling    Male hypogonadism 03/23/2019   Overbite    Sleep apnea    Vitamin D deficiency disease 03/23/2019    Patient Active Problem List   Diagnosis Date Noted   Chest pain of uncertain etiology 40/98/1191   Essential hypertension 05/19/2020   OSA (obstructive sleep apnea) 05/19/2020   Hyperlipidemia 05/19/2020   SOB  (shortness of breath) 05/19/2020   Vitamin D deficiency disease 03/23/2019   Male hypogonadism 03/23/2019   Sleep apnea    Chronic nasal congestion 07/23/2018   Circadian rhythm sleep disorder, shift work type 07/23/2018   Overbite, excessive 07/23/2018   Diaphoresis 07/23/2018   Chronic intermittent hypoxia with obstructive sleep apnea 07/23/2018   Severe obstructive sleep apnea 07/23/2018   Dysphagia 09/28/2015   Encounter for screening colonoscopy 09/28/2015   AP (abdominal pain) 09/28/2015   DYSPHAGIA 04/19/2010   GERD 10/07/2009    Past Surgical History:  Procedure Laterality Date   BIOPSY  11/01/2015   Procedure: BIOPSY;  Surgeon: Danie Binder, MD;  [facility];  Service: Endoscopy;;  gastric   CHOLECYSTECTOMY     COLONOSCOPY WITH PROPOFOL N/A 11/01/2015   Dr. Oneida Alar: internal hemorrhoids, otherwise normal.    ESOPHAGOGASTRODUODENOSCOPY  06/10/2009   Distal esophagea stricture dialted to 65m. Bx from distal esophagus (neg). erythema in body and antrum.   ESOPHAGOGASTRODUODENOSCOPY  05/30/2009   distal peptic stricture dialted to 163m diffuse bx proven gastritis, no .hpylori   ESOPHAGOGASTRODUODENOSCOPY (EGD) WITH PROPOFOL N/A 11/01/2015   Dr. FiOneida Alarbenign-appearing esophageal stenosis s/p dilation, chronic gastritis    HERNIA REPAIR Left    groin   SAVORY DILATION N/A 11/01/2015   Procedure: SAVORY DILATION;  Surgeon: SaDanie BinderMD;  [facility];  Service: Endoscopy;  Laterality: N/A;   VASECTOMY         Family History  Problem Relation Age of Onset   Cancer - Other Father        mesothelioma, deceased at age 42    Colon cancer Neg Hx     Social History   Tobacco Use   Smoking status: Never Smoker   Smokeless tobacco: Never Used  Vaping Use   Vaping Use: Never used  Substance Use Topics   Alcohol use: No    Alcohol/week: 0.0 standard drinks   Drug use: No    Home Medications Prior to  Admission medications   Medication Sig Start Date End Date Taking? Authorizing Provider  Cholecalciferol (VITAMIN D-3) 125 MCG (5000 UT) TABS Take 10,000 Units by mouth daily at 12 noon.   Yes [provider]  fluticasone (FLONASE) 50 MCG/ACT nasal spray Place 1 spray into both nostrils daily as needed for allergies or rhinitis. 09/10/19  Yes Petrucelli, Samantha R, PA-C  loratadine (CLARITIN) 10 MG tablet Take 10 mg by mouth daily.   Yes [provider]  nitrofurantoin, macrocrystal-monohydrate, (MACROBID) 100 MG capsule Take 1 capsule (100 mg total) by mouth 2 (two) times daily. 09/11/20  Yes Marcello Fennel, PA-C  pantoprazole (PROTONIX) 40 MG tablet TAKE 1 TABLET BY MOUTH EACH MORNING 30 MINUTES PRIOR TO BREAKFAST. Patient taking differently: Take 40 mg by mouth 2 (two) times daily before a meal. 03/29/20  Yes Gosrani, Nimish C, MD  testosterone cypionate (DEPOTESTOSTERONE CYPIONATE) 200 MG/ML injection INJECT 0.5 MLS INTO THE MUSCLE TWICE WEEKLY Patient taking differently: Inject 100 mg into the muscle 2 (two) times a week. Mondays and Thursdays. 07/15/20  Yes Gosrani, Nimish C, MD  valACYclovir (VALTREX) 1000 MG tablet Take 1,000 mg by mouth daily.  03/30/20  Yes [provider]    Allergies    Penicillins  Review of Systems   Review of Systems  Constitutional: Negative for chills and fever.  HENT: Negative for congestion and sore throat.   Eyes: Negative for visual disturbance.  Respiratory: Negative for shortness of breath.   Cardiovascular: Negative for chest pain.  Gastrointestinal: Positive for abdominal pain. Negative for constipation, diarrhea, nausea and vomiting.  Genitourinary: Positive for dysuria and flank pain. Negative for difficulty urinating, enuresis, frequency, penile swelling, scrotal swelling and testicular pain.  Musculoskeletal: Negative for back pain.  Skin: Negative for rash.  Neurological: Negative for dizziness.  Hematological: Does  not bruise/bleed easily.    Physical Exam Updated Vital Signs BP (!) 137/94    Pulse 91    Temp 98.5 F (36.9 C) (Oral)    Resp 18    Ht 6' 1"  (1.854 m)    Wt 98 kg    SpO2 99%    BMI 28.50 kg/m   Physical Exam Vitals and nursing note reviewed.  Constitutional:      General: He is not in acute distress.    Appearance: He is not ill-appearing.  HENT:     Head: Normocephalic and atraumatic.     Nose: No congestion.  Eyes:     Conjunctiva/sclera: Conjunctivae normal.  Cardiovascular:     Rate and Rhythm: Normal rate and regular rhythm.     Pulses: Normal pulses.     Heart sounds: No murmur heard. No friction rub. No gallop.   Pulmonary:     Effort: No respiratory distress.     Breath sounds: No wheezing, rhonchi or rales.  Abdominal:     General: There is no distension.     Palpations: Abdomen is soft.     Tenderness:  There is abdominal tenderness. There is no right CVA tenderness or left CVA tenderness.     Comments: Patient's abdomen is visualized, nondistended, normoactive bowel sounds, dull to percussion.  He was tender to palpation his right flank and right upper quadrant, negative Murphy sign, negative McBurney point, rebound tenderness or peritoneal sign.  He had no CVA tenderness on my exam.  Musculoskeletal:     Right lower leg: No edema.     Left lower leg: No edema.  Skin:    General: Skin is warm and dry.  Neurological:     Mental Status: He is alert.  Psychiatric:        Mood and Affect: Mood normal.     ED Results / Procedures / Treatments   Labs (all labs ordered are listed, but only abnormal results are displayed) Labs Reviewed  COMPREHENSIVE METABOLIC PANEL - Abnormal; Notable for the following components:      Result Value   Glucose, Bld 103 (*)    Creatinine, Ser 1.33 (*)    Calcium 8.6 (*)    All other components within normal limits  URINALYSIS, ROUTINE W REFLEX MICROSCOPIC - Abnormal; Notable for the following components:   Hgb urine dipstick  SMALL (*)    Protein, ur 100 (*)    Leukocytes,Ua SMALL (*)    Bacteria, UA RARE (*)    All other components within normal limits  URINE CULTURE  CBC WITH DIFFERENTIAL/PLATELET    EKG None  Radiology DG Chest Port 1 View  Result Date: 09/11/2020 CLINICAL DATA:  55 year old male with cough and shortness of breath. EXAM: PORTABLE CHEST 1 VIEW COMPARISON:  None. FINDINGS: The heart size and mediastinal contours are within normal limits. Both lungs are clear. The visualized skeletal structures are unremarkable. IMPRESSION: No acute cardiopulmonary process. Electronically Signed   By: Ruthann Cancer MD   On: 09/11/2020 11:14    Procedures Procedures   Medications Ordered in ED Medications - No data to display  ED Course  I have reviewed the triage vital signs and the nursing notes.  Pertinent labs & imaging results that were available during my care of the patient were reviewed by me and considered in my medical decision making (see chart for details).    MDM Rules/Calculators/A&P                         Initial impression-patient presents with right-sided abdominal pain.  He is alert, does not appear in acute distress, vital signs reassuring.  Concern for kidney stone versus UTI will obtain basic abdominal labs work-up, chest x-Tyresse and reevaluate.  Work-up-CBC negative for acute findings, CMP shows slight hyperglycemia of 103, creatinine 1.33 at baseline, no elevation liver enzymes or alk phos, no anion gap present.  UA shows small leukocytes, 20-50 red blood cells, rare bacteria.  Chest x-Rajinder negative for acute findings.  Reassessment patient is reassessed, has no complaints at this time, vital signs have remained stable.  Discussed lab work and imaging with patient.  Patient is agreeable for discharge at this time.  Rule out-I have low suspicion for incarcerated hernia as there is no bulging mass on my exam, no peritoneal sign, patient denies nausea, vomiting, constipation, still  passing flatulence.  Low suspicion for bowel obstruction as abdomen is nondistended, normoactive bowel sounds, dull to percussion, patient currently is passing flatus has no difficulty with bowel movements.  Low suspicion for pyelonephritis or kidney stones as patient has no CVA tenderness  on my exam, patient is nontoxic-appearing, vital signs reassuring,  UA negative for hematuria.  Patient does have an atypical UA possible he may be suffering from a UTI as he complains of dysuria we will start him on antibiotics and culture urine.  Low suspicion for AAA as history is atypical, patient has low risk factors.  No pulsatile mass on my exam.  Plan-suspect patient suffering from a muscular strain after his sneeze.  Will recommend over-the-counter pain medications.  Also having follow-up with general surgery as he endorses discomfort with his ventral hernia.  Patient does have a abnormal UA will culture and start him on antibiotics have him follow-up with PCP in 1 week's time for repeat UA  Vital signs have remained stable, no indication for hospital admission.  Patient discussed with attending and they agreed with assessment and plan.  Patient given at home care as well strict return precautions.  Patient verbalized that they understood agreed to said plan.   Final Clinical Impression(s) / ED Diagnoses Final diagnoses:  Right upper quadrant abdominal pain  Dysuria    Rx / DC Orders ED Discharge Orders         Ordered    nitrofurantoin, macrocrystal-monohydrate, (MACROBID) 100 MG capsule  2 times daily        09/11/20 1302           Aron Baba 09/11/20 1311    Pattricia Boss, MD 09/11/20 [phone]

## 2020-09-12 ENCOUNTER — Telehealth: Payer: Self-pay | Admitting: *Deleted

## 2020-09-12 NOTE — Telephone Encounter (Signed)
Transition Care Management Follow-up Telephone Call  Date of discharge and from where: 09/11/2020 - [facility]  How have you been since you were released from the hospital? "Still having a Zhen pain"  Any questions or concerns? No  Items Reviewed:  Did the pt receive and understand the discharge instructions provided? Yes   Medications obtained and verified? Yes   Other? No   Any new allergies since your discharge? No   Dietary orders reviewed? Yes  Do you have support at home? Yes   Home Care and Equipment/Supplies: Were home health services ordered? not applicable If so, what is the name of the agency? N/A  Has the agency set up a time to come to the patient's home? not applicable Were any new equipment or medical supplies ordered?  No What is the name of the medical supply agency? N/A Were you able to get the supplies/equipment? not applicable Do you have any questions related to the use of the equipment or supplies? No  Functional Questionnaire: (I = Independent and D = Dependent) ADLs: I  Bathing/Dressing- I  Meal Prep- I  Eating- I  Maintaining continence- I  Transferring/Ambulation- I  Managing Meds- I  Follow up appointments reviewed:   PCP Hospital f/u appt confirmed? No    Specialist Hospital f/u appt confirmed? No    Are transportation arrangements needed? No   If their condition worsens, is the pt aware to call PCP or go to the Emergency Dept.? Yes  Was the patient provided with contact information for the PCP's office or ED? Yes  Was to pt encouraged to call back with questions or concerns? Yes

## 2020-09-12 NOTE — Telephone Encounter (Signed)
SHALA WAS CALLED LVM ON THE REFERRAL COORD EXT. [phone].

## 2020-09-14 ENCOUNTER — Ambulatory Visit (INDEPENDENT_AMBULATORY_CARE_PROVIDER_SITE_OTHER): Payer: 59 | Admitting: Internal Medicine

## 2020-09-14 ENCOUNTER — Other Ambulatory Visit: Payer: Self-pay

## 2020-09-14 ENCOUNTER — Encounter (INDEPENDENT_AMBULATORY_CARE_PROVIDER_SITE_OTHER): Payer: Self-pay | Admitting: Internal Medicine

## 2020-09-14 VITALS — BP 134/82 | HR 87 | Temp 97.6°F | Resp 18 | Ht 73.0 in | Wt 234.6 lb

## 2020-09-14 DIAGNOSIS — A64 Unspecified sexually transmitted disease: Secondary | ICD-10-CM | POA: Diagnosis not present

## 2020-09-14 DIAGNOSIS — R1011 Right upper quadrant pain: Secondary | ICD-10-CM

## 2020-09-14 NOTE — Progress Notes (Signed)
Metrics: Intervention Frequency ACO  Documented Smoking Status Yearly  Screened one or more times in 24 months  Cessation Counseling or  Active cessation medication Past 24 months  Past 24 months   Guideline developer: UpToDate (See UpToDate for funding source) Date Released: 2014       Wellness Office Visit  Subjective:  Patient ID: James Blankenship, male    DOB: 1965/09/28  Age: 55 y.o. MRN: [id number]  CC: Right upper quadrant abdominal pain. HPI  This patient started to get the above symptoms which started approximately 4 days ago.  He went to the emergency room the next day.  He relates that when he sneezes or coughs, he gets the pain in the right upper quadrant area.  He denies any fever.  In the emergency room, it was felt that he might have a UTI and was treated with nitrofurantoin which he is still taking.  His white blood cell count was not elevated.  He had a chest x-Nixon done also in the emergency room which was negative. He also requests that he be tested for sexually transmitted infection. Past Medical History:  Diagnosis Date  . AP (abdominal pain)   . Chest pain   . Chest pressure   . Chronic intermittent hypoxia with obstructive sleep apnea   . Chronic nasal congestion   . Circadian rhythm sleep disorder   . Diaphoresis   . Dysphagia   . Gastritis   . GERD (gastroesophageal reflux disease)   . GERD (gastroesophageal reflux disease)   . Hand tingling   . Male hypogonadism 03/23/2019  . Overbite   . Sleep apnea   . Vitamin D deficiency disease 03/23/2019   Past Surgical History:  Procedure Laterality Date  . BIOPSY  11/01/2015   Procedure: BIOPSY;  Surgeon: Danie Binder, MD;  Location: [facility];  Service: Endoscopy;;  gastric  . CHOLECYSTECTOMY    . COLONOSCOPY WITH PROPOFOL N/A 11/01/2015   Dr. Oneida Alar: internal hemorrhoids, otherwise normal.   . ESOPHAGOGASTRODUODENOSCOPY  06/10/2009   Distal esophagea stricture dialted to 42mm. Bx from distal esophagus  (neg). erythema in body and antrum.  . ESOPHAGOGASTRODUODENOSCOPY  05/30/2009   distal peptic stricture dialted to 57mm, diffuse bx proven gastritis, no .hpylori  . ESOPHAGOGASTRODUODENOSCOPY (EGD) WITH PROPOFOL N/A 11/01/2015   Dr. Oneida Alar: benign-appearing esophageal stenosis s/p dilation, chronic gastritis   . HERNIA REPAIR Left    groin  . SAVORY DILATION N/A 11/01/2015   Procedure: SAVORY DILATION;  Surgeon: Danie Binder, MD;  Location: [facility];  Service: Endoscopy;  Laterality: N/A;  . VASECTOMY       Family History  Problem Relation Age of Onset  . Cancer - Other Father        mesothelioma, deceased at age 63   . Colon cancer Neg Hx     Social History   Social History Narrative   Married since 2019,lives with wife and extended family.Works at [employer].   Social History   Tobacco Use  . Smoking status: Never Smoker  . Smokeless tobacco: Never Used  Substance Use Topics  . Alcohol use: No    Alcohol/week: 0.0 standard drinks    Current Meds  Medication Sig  . Cholecalciferol (VITAMIN D-3) 125 MCG (5000 UT) TABS Take 10,000 Units by mouth daily at 12 noon.  . fluticasone (FLONASE) 50 MCG/ACT nasal spray Place 1 spray into both nostrils daily as needed for allergies or rhinitis.  Marland Kitchen loratadine (CLARITIN) 10 MG tablet  Take 10 mg by mouth daily.  . nitrofurantoin, macrocrystal-monohydrate, (MACROBID) 100 MG capsule Take 1 capsule (100 mg total) by mouth 2 (two) times daily.  . pantoprazole (PROTONIX) 40 MG tablet TAKE 1 TABLET BY MOUTH EACH MORNING 30 MINUTES PRIOR TO BREAKFAST. (Patient taking differently: Take 40 mg by mouth 2 (two) times daily before a meal.)  . testosterone cypionate (DEPOTESTOSTERONE CYPIONATE) 200 MG/ML injection INJECT 0.5 MLS INTO THE MUSCLE TWICE WEEKLY (Patient taking differently: Inject 100 mg into the muscle 2 (two) times a week. Mondays and Thursdays.)  . valACYclovir (VALTREX) 1000 MG tablet Take 1,000 mg by mouth daily.       [facility] Office Visit from 08/10/2020 in [facility]  PHQ-9 Total Score 0      Objective:   Today's Vitals: BP 134/82 (BP Location: Left Arm, Patient Position: Sitting, Cuff Size: Normal)   Pulse 87   Temp 97.6 F (36.4 C) (Temporal)   Resp 18   Ht 6\' 1"  (1.854 m)   Wt 234 lb 9.6 oz (106.4 kg)   SpO2 97%   BMI 30.95 kg/m  Vitals with BMI 09/14/2020 09/11/2020 09/11/2020  Height 6\' 1"  - -  Weight 234 lbs 10 oz - -  BMI 05.11 - -  Systolic 021 117 356  Diastolic 82 82 94  Pulse 87 87 91     Physical Exam   I examined the patient on standing and there is a possibility of a small hernia but I am not convinced.  Otherwise his abdomen is soft and minimally tender.    Assessment   1. Sexually transmitted disease in male   2. RUQ pain       Tests ordered Orders Placed This Encounter  Procedures  . CT Abdomen Pelvis W Contrast  . RPR (MONITOR) W/REFL  . HIV Antibody (routine testing w rflx)     Plan: 1. We will obtain a CT scan of the abdomen to further evaluate his right upper quadrant abdominal pain.  If it is negative, I think I will send him to surgery for further evaluation in case this is a hernia problem rather than anything else. 2. We will order RPR, HIV, gonococcal/chlamydia.   No orders of the defined types were placed in this encounter.   Doree Albee, MD

## 2020-09-16 ENCOUNTER — Ambulatory Visit: Payer: 59 | Admitting: Internal Medicine

## 2020-09-16 LAB — C. TRACHOMATIS/N. GONORRHOEAE RNA
C. trachomatis RNA, TMA: NOT DETECTED
N. gonorrhoeae RNA, TMA: NOT DETECTED

## 2020-09-16 LAB — HIV ANTIBODY (ROUTINE TESTING W REFLEX): HIV 1&2 Ab, 4th Generation: NONREACTIVE

## 2020-09-16 LAB — RPR (MONITOR) W/REFL: RPR (Monitor) w/refl Titer: NONREACTIVE

## 2020-09-19 ENCOUNTER — Telehealth (INDEPENDENT_AMBULATORY_CARE_PROVIDER_SITE_OTHER): Payer: Self-pay

## 2020-09-19 ENCOUNTER — Encounter (INDEPENDENT_AMBULATORY_CARE_PROVIDER_SITE_OTHER): Payer: Self-pay | Admitting: Internal Medicine

## 2020-09-19 NOTE — Telephone Encounter (Signed)
Called patient and LMOVM to return call  Called patient and left a detailed voice message for patient to call back to schedule a (30 minute Office Visit with Dr. Anastasio Champion per Dr. Anastasio Champion.)

## 2020-09-19 NOTE — Telephone Encounter (Signed)
He already sent a MyChart message to me regarding this.  However, he will need to make an in office appointment so we can discuss his symptoms before we can prescribe anything.

## 2020-09-26 NOTE — Progress Notes (Signed)
Please let the patient know that all the blood work/tests regarding sexually transmitted infections is negative.

## 2020-09-27 NOTE — Telephone Encounter (Signed)
Did you already talk to him? Or does he need a appt? Followup my work Copy.

## 2020-09-27 NOTE — Progress Notes (Signed)
Called pt ; no answer. LVM to review his mychart  to see lab results.

## 2020-09-28 ENCOUNTER — Ambulatory Visit (INDEPENDENT_AMBULATORY_CARE_PROVIDER_SITE_OTHER): Payer: 59 | Admitting: Internal Medicine

## 2020-09-28 NOTE — Telephone Encounter (Signed)
Appt is made. LVM of appt date; to call if he cannot make it.

## 2020-09-28 NOTE — Telephone Encounter (Signed)
He needs an appointment

## 2020-10-09 ENCOUNTER — Other Ambulatory Visit (INDEPENDENT_AMBULATORY_CARE_PROVIDER_SITE_OTHER): Payer: Self-pay | Admitting: Internal Medicine

## 2020-10-13 ENCOUNTER — Ambulatory Visit (INDEPENDENT_AMBULATORY_CARE_PROVIDER_SITE_OTHER): Payer: 59 | Admitting: Internal Medicine

## 2020-11-07 ENCOUNTER — Encounter: Payer: Self-pay | Admitting: Dermatology

## 2020-11-07 ENCOUNTER — Other Ambulatory Visit: Payer: Self-pay

## 2020-11-07 ENCOUNTER — Ambulatory Visit: Payer: 59 | Admitting: Dermatology

## 2020-11-07 DIAGNOSIS — D485 Neoplasm of uncertain behavior of skin: Secondary | ICD-10-CM

## 2020-11-07 DIAGNOSIS — D1801 Hemangioma of skin and subcutaneous tissue: Secondary | ICD-10-CM | POA: Diagnosis not present

## 2020-11-07 DIAGNOSIS — D294 Benign neoplasm of scrotum: Secondary | ICD-10-CM | POA: Diagnosis not present

## 2020-11-07 DIAGNOSIS — D239 Other benign neoplasm of skin, unspecified: Secondary | ICD-10-CM

## 2020-11-07 NOTE — Patient Instructions (Signed)

## 2020-11-08 ENCOUNTER — Ambulatory Visit (INDEPENDENT_AMBULATORY_CARE_PROVIDER_SITE_OTHER): Payer: 59 | Admitting: Internal Medicine

## 2020-11-08 ENCOUNTER — Encounter (INDEPENDENT_AMBULATORY_CARE_PROVIDER_SITE_OTHER): Payer: Self-pay | Admitting: Internal Medicine

## 2020-11-08 VITALS — BP 161/100 | HR 114 | Temp 98.9°F | Resp 18 | Ht 73.0 in | Wt 238.0 lb

## 2020-11-08 DIAGNOSIS — G4730 Sleep apnea, unspecified: Secondary | ICD-10-CM

## 2020-11-08 DIAGNOSIS — N289 Disorder of kidney and ureter, unspecified: Secondary | ICD-10-CM | POA: Diagnosis not present

## 2020-11-08 DIAGNOSIS — E291 Testicular hypofunction: Secondary | ICD-10-CM

## 2020-11-08 DIAGNOSIS — Z125 Encounter for screening for malignant neoplasm of prostate: Secondary | ICD-10-CM

## 2020-11-08 NOTE — Progress Notes (Signed)
Metrics: Intervention Frequency ACO  Documented Smoking Status Yearly  Screened one or more times in 24 months  Cessation Counseling or  Active cessation medication Past 24 months  Past 24 months   Guideline developer: UpToDate (See UpToDate for funding source) Date Released: 2014       Wellness Office Visit  Subjective:  Patient ID: James Blankenship, male    DOB: August 04, 1965  Age: 55 y.o. MRN: [id number]  CC: This man comes in for follow-up of testosterone therapy, renal insufficiency.  He also has sleep apnea. HPI  He continues to take testosterone therapy twice a week. Past Medical History:  Diagnosis Date  . AP (abdominal pain)   . Chest pain   . Chest pressure   . Chronic intermittent hypoxia with obstructive sleep apnea   . Chronic nasal congestion   . Circadian rhythm sleep disorder   . Diaphoresis   . Dysphagia   . Gastritis   . GERD (gastroesophageal reflux disease)   . GERD (gastroesophageal reflux disease)   . Hand tingling   . Male hypogonadism 03/23/2019  . Overbite   . Sleep apnea   . Vitamin D deficiency disease 03/23/2019   Past Surgical History:  Procedure Laterality Date  . BIOPSY  11/01/2015   Procedure: BIOPSY;  Surgeon: Danie Binder, MD;  [facility];  Service: Endoscopy;;  gastric  . CHOLECYSTECTOMY    . COLONOSCOPY WITH PROPOFOL N/A 11/01/2015   Dr. Oneida Alar: internal hemorrhoids, otherwise normal.   . ESOPHAGOGASTRODUODENOSCOPY  06/10/2009   Distal esophagea stricture dialted to 37mm. Bx from distal esophagus (neg). erythema in body and antrum.  . ESOPHAGOGASTRODUODENOSCOPY  05/30/2009   distal peptic stricture dialted to 7mm, diffuse bx proven gastritis, no .hpylori  . ESOPHAGOGASTRODUODENOSCOPY (EGD) WITH PROPOFOL N/A 11/01/2015   Dr. Oneida Alar: benign-appearing esophageal stenosis s/p dilation, chronic gastritis   . HERNIA REPAIR Left    groin  . SAVORY DILATION N/A 11/01/2015   Procedure: SAVORY DILATION;  Surgeon: Danie Binder, MD;   [facility]: [facility];  Service: Endoscopy;  Laterality: N/A;  . VASECTOMY       Family History  Problem Relation Age of Onset  . Cancer - Other Father        mesothelioma, deceased at age 8   . Colon cancer Neg Hx     Social History   Social History Narrative   Married since 2019,lives with wife and extended family.Works at [employer].   Social History   Tobacco Use  . Smoking status: Never Smoker  . Smokeless tobacco: Never Used  Substance Use Topics  . Alcohol use: No    Alcohol/week: 0.0 standard drinks    Current Meds  Medication Sig  . Cholecalciferol (VITAMIN D-3) 125 MCG (5000 UT) TABS Take 10,000 Units by mouth daily at 12 noon.  . fluticasone (FLONASE) 50 MCG/ACT nasal spray Place 1 spray into both nostrils daily as needed for allergies or rhinitis.  Marland Kitchen loratadine (CLARITIN) 10 MG tablet Take 10 mg by mouth daily.  . pantoprazole (PROTONIX) 40 MG tablet TAKE 1 TABLET BY MOUTH EVERY MORNING 30 MINUTES BEFORE BREAKFAST  . testosterone cypionate (DEPOTESTOSTERONE CYPIONATE) 200 MG/ML injection INJECT 0.5 MLS INTO THE MUSCLE TWICE WEEKLY (Patient taking differently: Inject 100 mg into the muscle 2 (two) times a week. Mondays and Thursdays.)  . valACYclovir (VALTREX) 1000 MG tablet Take 1,000 mg by mouth daily.      [facility] Office Visit from 08/10/2020 in [facility]  PHQ-9 Total Score 0      Objective:   Today's Vitals: BP (!) 161/100 (BP Location: Right Arm, Patient Position: Sitting, Cuff Size: Normal)   Pulse (!) 114   Temp 98.9 F (37.2 C) (Temporal)   Resp 18   Ht 6\' 1"  (1.854 m)   Wt 238 lb (108 kg)   SpO2 98%   BMI 31.40 kg/m  Vitals with BMI 11/08/2020 09/14/2020 09/11/2020  Height 6\' 1"  6\' 1"  -  Weight 238 lbs 234 lbs 10 oz -  BMI 62.83 15.17 -  Systolic 616 073 710  Diastolic 626 82 82  Pulse 114 87 87     Physical Exam  Has gained a few pounds in weight.  Blood pressure elevated today but he was rushing to get here.   Previous blood pressure reading has not been too bad.  He is alert and orientated.     Assessment   1. Male hypogonadism   2. Sleep apnea, unspecified type   3. Renal insufficiency   4. Special screening for malignant neoplasm of prostate       Tests ordered Orders Placed This Encounter  Procedures  . COMPLETE METABOLIC PANEL WITH GFR  . PSA, Total with Reflex to PSA, Free     Plan: 1. Continue with testosterone therapy as before.  Check PSA and electrolytes for renal function. 2. Follow-up in 6 months.   No orders of the defined types were placed in this encounter.   Doree Albee, MD

## 2020-11-09 ENCOUNTER — Other Ambulatory Visit (INDEPENDENT_AMBULATORY_CARE_PROVIDER_SITE_OTHER): Payer: Self-pay | Admitting: Internal Medicine

## 2020-11-09 ENCOUNTER — Encounter (INDEPENDENT_AMBULATORY_CARE_PROVIDER_SITE_OTHER): Payer: Self-pay

## 2020-11-09 DIAGNOSIS — N289 Disorder of kidney and ureter, unspecified: Secondary | ICD-10-CM

## 2020-11-09 LAB — COMPLETE METABOLIC PANEL WITH GFR
AG Ratio: 1.6 (calc) (ref 1.0–2.5)
ALT: 27 U/L (ref 9–46)
AST: 19 U/L (ref 10–35)
Albumin: 4.4 g/dL (ref 3.6–5.1)
Alkaline phosphatase (APISO): 54 U/L (ref 35–144)
BUN/Creatinine Ratio: 13 (calc) (ref 6–22)
BUN: 20 mg/dL (ref 7–25)
CO2: 28 mmol/L (ref 20–32)
Calcium: 10 mg/dL (ref 8.6–10.3)
Chloride: 101 mmol/L (ref 98–110)
Creat: 1.57 mg/dL — ABNORMAL HIGH (ref 0.70–1.33)
GFR, Est African American: 57 mL/min/{1.73_m2} — ABNORMAL LOW (ref >=60)
GFR, Est Non African American: 49 mL/min/{1.73_m2} — ABNORMAL LOW (ref >=60)
Globulin: 2.7 g/dL (calc) (ref 1.9–3.7)
Glucose, Bld: 100 mg/dL (ref 65–139)
Potassium: 4.4 mmol/L (ref 3.5–5.3)
Sodium: 138 mmol/L (ref 135–146)
Total Bilirubin: 0.6 mg/dL (ref 0.2–1.2)
Total Protein: 7.1 g/dL (ref 6.1–8.1)

## 2020-11-09 LAB — PSA, TOTAL WITH REFLEX TO PSA, FREE: PSA, Total: 0.9 ng/mL (ref <=4.0)

## 2020-11-14 ENCOUNTER — Telehealth: Payer: Self-pay | Admitting: Pulmonary Disease

## 2020-11-14 DIAGNOSIS — G4733 Obstructive sleep apnea (adult) (pediatric): Secondary | ICD-10-CM

## 2020-11-14 NOTE — Telephone Encounter (Signed)
Spoke with pt, aware of recs. Order placed to lower pressure. Nothing further needed at this time- will close encounter.

## 2020-11-14 NOTE — Telephone Encounter (Signed)
Pressure can be changed to 22/18 with a backup rate of 10  He can call us back if he feels he still too high  We need to get a download on him at some point to make sure it is still effective

## 2020-11-14 NOTE — Telephone Encounter (Signed)
Pt requesting the pressure on his bipap be turned down, states it feels suffocating.  Pt is on 24/20 with a backup rate of 10.   AO please advise, thanks!

## 2020-11-19 ENCOUNTER — Encounter: Payer: Self-pay | Admitting: Dermatology

## 2020-11-19 NOTE — Progress Notes (Signed)
   Follow-Up Visit   Subjective  James Blankenship is a 55 y.o. male who presents for the following: Annual Exam (Left temple x months- keeps hitting with a razor).  General skin examination Location: Change in spots on left sideburn Duration:  Quality:  Associated Signs/Symptoms: Modifying Factors:  Severity:  Timing: Context:   Objective  Well appearing patient in no apparent distress; mood and affect are within normal limits. Objective  Posterior Scrotum: Blue two millimeter dermal bubbles.  Some history of bleeding.  Objective  Chest - Medial [facility]): 5 mm violet colored papule, dermoscopy typical.  Objective  Left sideburn inferior: Waxy 5 mm flesh-colored papule     Objective  Left sideburn superior: Hemorrhagic 5 mm papule       A full examination was performed including scalp, head, eyes, ears, nose, lips, neck, chest, axillae, abdomen, back, buttocks, bilateral upper extremities, bilateral lower extremities, hands, feet, fingers, toes, fingernails, and toenails. All findings within normal limits unless otherwise noted below.   Assessment & Plan    Angiokeratoma Posterior Scrotum  Will give patient Flo Shanks number at [facility].   Hemangioma of skin Chest - Medial (Center)  Leave if stable  Neoplasm of uncertain behavior of skin (2) Left sideburn inferior  Skin / nail biopsy Type of biopsy: tangential   Informed consent: discussed and consent obtained   Timeout: patient name, date of birth, surgical site, and procedure verified   Anesthesia: the lesion was anesthetized in a standard fashion   Anesthetic:  1% lidocaine w/ epinephrine 1-100,000 local infiltration Instrument used: flexible razor blade   Hemostasis achieved with: ferric subsulfate   Outcome: patient tolerated procedure well   Post-procedure details: wound care instructions given    Specimen 1 - Surgical pathology Differential Diagnosis: scc vs bcc  Check Margins: No  Left  sideburn superior  Skin / nail biopsy Type of biopsy: tangential   Informed consent: discussed and consent obtained   Timeout: patient name, date of birth, surgical site, and procedure verified   Anesthesia: the lesion was anesthetized in a standard fashion   Anesthetic:  1% lidocaine w/ epinephrine 1-100,000 local infiltration Instrument used: flexible razor blade   Hemostasis achieved with: ferric subsulfate   Outcome: patient tolerated procedure well   Post-procedure details: wound care instructions given    Specimen 2 - Surgical pathology Differential Diagnosis: scc vs bcc  Check Margins: No      I, Lavonna Monarch, MD, have reviewed all documentation for this visit.  The documentation on 11/19/20 for the exam, diagnosis, procedures, and orders are all accurate and complete.

## 2020-12-09 ENCOUNTER — Emergency Department (HOSPITAL_COMMUNITY)
Admission: EM | Admit: 2020-12-09 | Discharge: 2020-12-09 | Disposition: A | Discharge status: Home / Self Care | Payer: 59 | Attending: Emergency Medicine | Admitting: Emergency Medicine

## 2020-12-09 ENCOUNTER — Encounter (HOSPITAL_COMMUNITY): Payer: Self-pay | Admitting: Emergency Medicine

## 2020-12-09 ENCOUNTER — Emergency Department (HOSPITAL_COMMUNITY): Payer: 59

## 2020-12-09 ENCOUNTER — Other Ambulatory Visit: Payer: Self-pay

## 2020-12-09 DIAGNOSIS — X58XXXA Exposure to other specified factors, initial encounter: Secondary | ICD-10-CM | POA: Diagnosis not present

## 2020-12-09 DIAGNOSIS — S29011A Strain of muscle and tendon of front wall of thorax, initial encounter: Secondary | ICD-10-CM | POA: Diagnosis not present

## 2020-12-09 DIAGNOSIS — I10 Essential (primary) hypertension: Secondary | ICD-10-CM | POA: Insufficient documentation

## 2020-12-09 DIAGNOSIS — S299XXA Unspecified injury of thorax, initial encounter: Secondary | ICD-10-CM | POA: Diagnosis present

## 2020-12-09 MED ORDER — BENZONATATE 200 MG PO CAPS: 200.0000 mg | ORAL_CAPSULE | Freq: Three times a day (TID) | ORAL | 0 refills | Status: DC | PRN

## 2020-12-09 MED ORDER — ALBUTEROL SULFATE HFA 108 (90 BASE) MCG/ACT IN AERS: 2.0000 | INHALATION_SPRAY | RESPIRATORY_TRACT | Status: DC | PRN

## 2020-12-09 MED ORDER — DEXAMETHASONE SODIUM PHOSPHATE 10 MG/ML IJ SOLN
10.0000 mg | Freq: Once | INTRAMUSCULAR | Status: AC
  Administered 2020-12-09: 10 mg via INTRAMUSCULAR
  Filled 2020-12-09: qty 1

## 2020-12-09 MED ORDER — HYDROCODONE-ACETAMINOPHEN 5-325 MG PO TABS: 1.0000 | ORAL_TABLET | ORAL | 0 refills | Status: DC | PRN

## 2020-12-09 NOTE — ED Triage Notes (Signed)
Pt c/o congestion and cough since Wednesday. Pt also c/o left flank pain after sneezing and coughing yesterday.

## 2020-12-09 NOTE — ED Provider Notes (Signed)
[facility] EMERGENCY DEPARTMENT Provider Note   CSN: [id number] Arrival date & time: 12/09/20  2774     History Chief Complaint  Patient presents with  . Cough  . Flank Pain    Coal E Fait is a 55 y.o. male.  Patient presents to the emergency department for evaluation of cough and left rib pain.  Patient reports that he started having problems with his sinuses after mowing the lawn.  He has had some cough and sneezing secondary to this.  Patient reports that he forcefully sneezed and felt a sharp tearing pain in the left lower ribs and has had persistent pain ever since.  Pain worsens whenever he coughs, sneezes or moves.        Past Medical History:  Diagnosis Date  . AP (abdominal pain)   . Chest pain   . Chest pressure   . Chronic intermittent hypoxia with obstructive sleep apnea   . Chronic nasal congestion   . Circadian rhythm sleep disorder   . Diaphoresis   . Dysphagia   . Gastritis   . GERD (gastroesophageal reflux disease)   . GERD (gastroesophageal reflux disease)   . Hand tingling   . Male hypogonadism 03/23/2019  . Overbite   . Sleep apnea   . Vitamin D deficiency disease 03/23/2019    Patient Active Problem List   Diagnosis Date Noted  . Chest pain of uncertain etiology 12/87/8676  . Essential hypertension 05/19/2020  . OSA (obstructive sleep apnea) 05/19/2020  . Hyperlipidemia 05/19/2020  . SOB (shortness of breath) 05/19/2020  . Vitamin D deficiency disease 03/23/2019  . Male hypogonadism 03/23/2019  . Sleep apnea   . Chronic nasal congestion 07/23/2018  . Circadian rhythm sleep disorder, shift work type 07/23/2018  . Overbite, excessive 07/23/2018  . Diaphoresis 07/23/2018  . Chronic intermittent hypoxia with obstructive sleep apnea 07/23/2018  . Severe obstructive sleep apnea 07/23/2018  . Dysphagia 09/28/2015  . Encounter for screening colonoscopy 09/28/2015  . AP (abdominal pain) 09/28/2015  . DYSPHAGIA 04/19/2010  . GERD 10/07/2009     Past Surgical History:  Procedure Laterality Date  . BIOPSY  11/01/2015   Procedure: BIOPSY;  Surgeon: Danie Binder, MD;  Location: [facility];  Service: Endoscopy;;  gastric  . CHOLECYSTECTOMY    . COLONOSCOPY WITH PROPOFOL N/A 11/01/2015   Dr. Oneida Alar: internal hemorrhoids, otherwise normal.   . ESOPHAGOGASTRODUODENOSCOPY  06/10/2009   Distal esophagea stricture dialted to 51mm. Bx from distal esophagus (neg). erythema in body and antrum.  . ESOPHAGOGASTRODUODENOSCOPY  05/30/2009   distal peptic stricture dialted to 28mm, diffuse bx proven gastritis, no .hpylori  . ESOPHAGOGASTRODUODENOSCOPY (EGD) WITH PROPOFOL N/A 11/01/2015   Dr. Oneida Alar: benign-appearing esophageal stenosis s/p dilation, chronic gastritis   . HERNIA REPAIR Left    groin  . SAVORY DILATION N/A 11/01/2015   Procedure: SAVORY DILATION;  Surgeon: Danie Binder, MD;  Location: [facility];  Service: Endoscopy;  Laterality: N/A;  . VASECTOMY         Family History  Problem Relation Age of Onset  . Cancer - Other Father        mesothelioma, deceased at age 63   . Colon cancer Neg Hx     Social History   Tobacco Use  . Smoking status: Never Smoker  . Smokeless tobacco: Never Used  Vaping Use  . Vaping Use: Never used  Substance Use Topics  . Alcohol use: No    Alcohol/week: 0.0 standard drinks  .  Drug use: No    Home Medications Prior to Admission medications   Medication Sig Start Date End Date Taking? Authorizing Provider  benzonatate (TESSALON) 200 MG capsule Take 1 capsule (200 mg total) by mouth 3 (three) times daily as needed for cough. 12/09/20  Yes Lafreda Casebeer, Gwenyth Allegra, MD  HYDROcodone-acetaminophen (NORCO/VICODIN) 5-325 MG tablet Take 1 tablet by mouth every 4 (four) hours as needed for moderate pain. 12/09/20  Yes Jnai Snellgrove, Gwenyth Allegra, MD  Cholecalciferol (VITAMIN D-3) 125 MCG (5000 UT) TABS Take 10,000 Units by mouth daily at 12 noon.    [provider]  fluticasone (FLONASE)  50 MCG/ACT nasal spray Place 1 spray into both nostrils daily as needed for allergies or rhinitis. 09/10/19   Petrucelli, Samantha R, PA-C  loratadine (CLARITIN) 10 MG tablet Take 10 mg by mouth daily.    [provider]  pantoprazole (PROTONIX) 40 MG tablet TAKE 1 TABLET BY MOUTH EVERY MORNING 30 MINUTES BEFORE BREAKFAST 10/10/20   Hurshel Party C, MD  testosterone cypionate (DEPOTESTOSTERONE CYPIONATE) 200 MG/ML injection INJECT 0.5 MLS INTO THE MUSCLE TWICE WEEKLY Patient taking differently: Inject 100 mg into the muscle 2 (two) times a week. Mondays and Thursdays. 07/15/20   Doree Albee, MD  valACYclovir (VALTREX) 1000 MG tablet Take 1,000 mg by mouth daily.  03/30/20   [provider]    Allergies    Penicillins  Review of Systems   Review of Systems  Respiratory: Positive for cough.   All other systems reviewed and are negative.   Physical Exam Updated Vital Signs BP (!) 155/106   Pulse 86   Temp 98.7 F (37.1 C) (Oral)   Resp 16   Ht 6\' 1"  (1.854 m)   Wt 108 kg   SpO2 95%   BMI 31.41 kg/m   Physical Exam Vitals and nursing note reviewed.  Constitutional:      General: He is not in acute distress.    Appearance: Normal appearance. He is well-developed.  HENT:     Head: Normocephalic and atraumatic.     Right Ear: Hearing normal.     Left Ear: Hearing normal.     Nose: Nose normal.  Eyes:     Conjunctiva/sclera: Conjunctivae normal.     Pupils: Pupils are equal, round, and reactive to light.  Cardiovascular:     Rate and Rhythm: Regular rhythm.     Heart sounds: S1 normal and S2 normal. No murmur heard. No friction rub. No gallop.   Pulmonary:     Effort: Pulmonary effort is normal. No respiratory distress.     Breath sounds: Normal breath sounds.  Chest:     Chest wall: Tenderness present. No crepitus.    Abdominal:     General: Bowel sounds are normal.     Palpations: Abdomen is soft.     Tenderness: There is no abdominal tenderness.  There is no guarding or rebound. Negative signs include Murphy's sign and McBurney's sign.     Hernia: No hernia is present.  Musculoskeletal:        General: Normal range of motion.     Cervical back: Normal range of motion and neck supple.  Skin:    General: Skin is warm and dry.     Findings: No rash.  Neurological:     Mental Status: He is alert and oriented to person, place, and time.     GCS: GCS eye subscore is 4. GCS verbal subscore is 5. GCS motor subscore is  6.     Cranial Nerves: No cranial nerve deficit.     Sensory: No sensory deficit.     Coordination: Coordination normal.  Psychiatric:        Speech: Speech normal.        Behavior: Behavior normal.        Thought Content: Thought content normal.     ED Results / Procedures / Treatments   Labs (all labs ordered are listed, but only abnormal results are displayed) Labs Reviewed - No data to display  EKG None  Radiology DG Ribs Unilateral W/Chest Left  Result Date: 12/09/2020 CLINICAL DATA:  55 year old male with cough and congestion. Left rib pain. Left flank pain. EXAM: LEFT RIBS AND CHEST - 3+ VIEW COMPARISON:  Portable chest 09/11/2020 and earlier. FINDINGS: Portable AP upright view at 0636 hours. Lung volumes and mediastinal contours remain within normal limits. Lung markings appear stable and clear when allowing for portable technique. No pneumothorax or pleural effusion. Visualized tracheal air column is within normal limits. Two oblique views of the left ribs. No left rib fracture or rib lesion identified. Benign appearing sclerotic area in the central medullary space of the proximal left humerus appears stable since 2020. Probable enchondroma. No acute osseous abnormality identified. Negative visible bowel gas pattern. IMPRESSION: 1. No left rib fracture or rib lesion identified. 2. No acute cardiopulmonary abnormality. Electronically Signed   By: Genevie Ann M.D.   On: 12/09/2020 06:48    Procedures Procedures    Medications Ordered in ED Medications  albuterol (VENTOLIN HFA) 108 (90 Base) MCG/ACT inhaler 2 puff (has no administration in time range)  dexamethasone (DECADRON) injection 10 mg (10 mg Intramuscular Given 12/09/20 0109)    ED Course  I have reviewed the triage vital signs and the nursing notes.  Pertinent labs & imaging results that were available during my care of the patient were reviewed by me and considered in my medical decision making (see chart for details).    MDM Rules/Calculators/A&P                          Patient experiencing left chest wall pain.  Patient sneezed and had sudden onset of severe pain.  Patient now experiencing pain with movement.  Area is tender to the touch.  Patient does have some cough associated as well.  Chest x-Darric does not show any evidence of pneumonia.  Lungs are clear.  No obvious displaced rib fractures.  No hypoxia.  Treat with analgesia, given return precautions.  Final Clinical Impression(s) / ED Diagnoses Final diagnoses:  Intercostal muscle strain, initial encounter    Rx / DC Orders ED Discharge Orders         Ordered    benzonatate (TESSALON) 200 MG capsule  3 times daily PRN        12/09/20 0717    HYDROcodone-acetaminophen (NORCO/VICODIN) 5-325 MG tablet  Every 4 hours PRN        12/09/20 0717           Orpah Greek, MD 12/09/20 [phone]

## 2020-12-12 ENCOUNTER — Ambulatory Visit
Admission: EM | Admit: 2020-12-12 | Discharge: 2020-12-12 | Disposition: A | Discharge status: Home / Self Care | Payer: 59 | Attending: Family Medicine | Admitting: Family Medicine

## 2020-12-12 ENCOUNTER — Other Ambulatory Visit: Payer: Self-pay

## 2020-12-12 DIAGNOSIS — J0140 Acute pansinusitis, unspecified: Secondary | ICD-10-CM

## 2020-12-12 DIAGNOSIS — M940 Chondrocostal junction syndrome [Tietze]: Secondary | ICD-10-CM

## 2020-12-12 DIAGNOSIS — J069 Acute upper respiratory infection, unspecified: Secondary | ICD-10-CM | POA: Diagnosis not present

## 2020-12-12 DIAGNOSIS — T148XXA Other injury of unspecified body region, initial encounter: Secondary | ICD-10-CM

## 2020-12-12 DIAGNOSIS — R110 Nausea: Secondary | ICD-10-CM

## 2020-12-12 DIAGNOSIS — R42 Dizziness and giddiness: Secondary | ICD-10-CM

## 2020-12-12 MED ORDER — PREDNISONE 10 MG (21) PO TBPK: ORAL_TABLET | Freq: Every day | ORAL | 0 refills | Status: AC

## 2020-12-12 MED ORDER — ONDANSETRON HCL 4 MG PO TABS: 4.0000 mg | ORAL_TABLET | Freq: Four times a day (QID) | ORAL | 0 refills | Status: DC

## 2020-12-12 MED ORDER — PROMETHAZINE-DM 6.25-15 MG/5ML PO SYRP: 5.0000 mL | ORAL_SOLUTION | Freq: Four times a day (QID) | ORAL | 0 refills | Status: DC | PRN

## 2020-12-12 MED ORDER — DOXYCYCLINE HYCLATE 100 MG PO CAPS: 100.0000 mg | ORAL_CAPSULE | Freq: Two times a day (BID) | ORAL | 0 refills | Status: DC

## 2020-12-12 MED ORDER — CYCLOBENZAPRINE HCL 10 MG PO TABS: 10.0000 mg | ORAL_TABLET | Freq: Two times a day (BID) | ORAL | 0 refills | Status: DC | PRN

## 2020-12-12 MED ORDER — MECLIZINE HCL 25 MG PO TABS: 25.0000 mg | ORAL_TABLET | Freq: Three times a day (TID) | ORAL | 0 refills | Status: AC | PRN

## 2020-12-12 NOTE — ED Triage Notes (Signed)
Pt presents with left flank pain and cough since last week, was seen in ED and had imaging

## 2020-12-12 NOTE — Discharge Instructions (Addendum)
I have sent in doxycycline for you to take one tablet twice a day for 10 days  I have sent in a prednisone taper for you to take for 6 days. 6 tablets on day one, 5 tablets on day two, 4 tablets on day three, 3 tablets on day four, 2 tablets on day five, and 1 tablet on day six.  I have sent in cough syrup for you to take. This medication can make you sleepy. Do not drive while taking this medication.  I have sent in Zofran for you to take one tablet every 8 hours as needed for nausea.  I have sent in meclizine for you to take three times per day as needed for vertigo  I have sent in flexeril for you to take twice a day as needed for muscle spasms. This medication can make you sleepy. Do not drive or operate heavy machinery with this medication.  Follow up with this office or with primary care if symptoms are persisting.  Follow up in the ER for high fever, trouble swallowing, trouble breathing, other concerning symptoms.

## 2020-12-12 NOTE — ED Provider Notes (Signed)
[facility]    CSN: [id number] Arrival date & time: 12/12/20  1031      History   Chief Complaint Chief Complaint  Patient presents with  . Cough    HPI James Blankenship is a 55 y.o. male.   Reports left flank pain, cough, nasal congestion, sinus pressure for the last week. Was seen in the ER and told that he pulled muscles to the left side. Was treated with benzonatate and Norco. States that the Norco helps the pain some, but nothing is helping his cough. Has also been taking OTC cough and cold. States that he cannot get comfortable enough to sleep due to the pain. Denies sick contacts. Has negative hx Covid. Has not completed Covid vaccines. Has not completed flu vaccine. Also had negative chest xray in the ER. Denies abdominal pain, vomiting, diarrhea, rash, fever, other symptoms.  ROS per HPI  The history is provided by the spouse and the patient.    Past Medical History:  Diagnosis Date  . AP (abdominal pain)   . Chest pain   . Chest pressure   . Chronic intermittent hypoxia with obstructive sleep apnea   . Chronic nasal congestion   . Circadian rhythm sleep disorder   . Diaphoresis   . Dysphagia   . Gastritis   . GERD (gastroesophageal reflux disease)   . GERD (gastroesophageal reflux disease)   . Hand tingling   . Male hypogonadism 03/23/2019  . Overbite   . Sleep apnea   . Vitamin D deficiency disease 03/23/2019    Patient Active Problem List   Diagnosis Date Noted  . Chest pain of uncertain etiology 29/93/7169  . Essential hypertension 05/19/2020  . OSA (obstructive sleep apnea) 05/19/2020  . Hyperlipidemia 05/19/2020  . SOB (shortness of breath) 05/19/2020  . Vitamin D deficiency disease 03/23/2019  . Male hypogonadism 03/23/2019  . Sleep apnea   . Chronic nasal congestion 07/23/2018  . Circadian rhythm sleep disorder, shift work type 07/23/2018  . Overbite, excessive 07/23/2018  . Diaphoresis 07/23/2018  . Chronic intermittent hypoxia with  obstructive sleep apnea 07/23/2018  . Severe obstructive sleep apnea 07/23/2018  . Dysphagia 09/28/2015  . Encounter for screening colonoscopy 09/28/2015  . AP (abdominal pain) 09/28/2015  . DYSPHAGIA 04/19/2010  . GERD 10/07/2009    Past Surgical History:  Procedure Laterality Date  . BIOPSY  11/01/2015   Procedure: BIOPSY;  Surgeon: Danie Binder, MD;  Location: [facility];  Service: Endoscopy;;  gastric  . CHOLECYSTECTOMY    . COLONOSCOPY WITH PROPOFOL N/A 11/01/2015   Dr. Oneida Alar: internal hemorrhoids, otherwise normal.   . ESOPHAGOGASTRODUODENOSCOPY  06/10/2009   Distal esophagea stricture dialted to 67mm. Bx from distal esophagus (neg). erythema in body and antrum.  . ESOPHAGOGASTRODUODENOSCOPY  05/30/2009   distal peptic stricture dialted to 22mm, diffuse bx proven gastritis, no .hpylori  . ESOPHAGOGASTRODUODENOSCOPY (EGD) WITH PROPOFOL N/A 11/01/2015   Dr. Oneida Alar: benign-appearing esophageal stenosis s/p dilation, chronic gastritis   . HERNIA REPAIR Left    groin  . SAVORY DILATION N/A 11/01/2015   Procedure: SAVORY DILATION;  Surgeon: Danie Binder, MD;  Location: [facility];  Service: Endoscopy;  Laterality: N/A;  . VASECTOMY         Home Medications    Prior to Admission medications   Medication Sig Start Date End Date Taking? Authorizing Provider  cyclobenzaprine (FLEXERIL) 10 MG tablet Take 1 tablet (10 mg total) by mouth 2 (two) times daily as needed  for muscle spasms. 12/12/20  Yes Faustino Congress, NP  doxycycline (VIBRAMYCIN) 100 MG capsule Take 1 capsule (100 mg total) by mouth 2 (two) times daily. 12/12/20  Yes Faustino Congress, NP  meclizine (ANTIVERT) 25 MG tablet Take 1 tablet (25 mg total) by mouth 3 (three) times daily as needed for dizziness. 12/12/20  Yes Faustino Congress, NP  ondansetron (ZOFRAN) 4 MG tablet Take 1 tablet (4 mg total) by mouth every 6 (six) hours. 12/12/20  Yes Faustino Congress, NP  predniSONE (STERAPRED UNI-PAK 21 TAB) 10 MG  (21) TBPK tablet Take by mouth daily for 6 days. Take 6 tablets on day 1, 5 tablets on day 2, 4 tablets on day 3, 3 tablets on day 4, 2 tablets on day 5, 1 tablet on day 6 12/12/20 12/18/20 Yes Faustino Congress, NP  promethazine-dextromethorphan (PROMETHAZINE-DM) 6.25-15 MG/5ML syrup Take 5 mLs by mouth 4 (four) times daily as needed for cough. 12/12/20  Yes Faustino Congress, NP  benzonatate (TESSALON) 200 MG capsule Take 1 capsule (200 mg total) by mouth 3 (three) times daily as needed for cough. 12/09/20   Orpah Greek, MD  Cholecalciferol (VITAMIN D-3) 125 MCG (5000 UT) TABS Take 10,000 Units by mouth daily at 12 noon.    [provider]  fluticasone (FLONASE) 50 MCG/ACT nasal spray Place 1 spray into both nostrils daily as needed for allergies or rhinitis. 09/10/19   Petrucelli, Glynda Jaeger, PA-C  HYDROcodone-acetaminophen (NORCO/VICODIN) 5-325 MG tablet Take 1 tablet by mouth every 4 (four) hours as needed for moderate pain. 12/09/20   Orpah Greek, MD  loratadine (CLARITIN) 10 MG tablet Take 10 mg by mouth daily.    [provider]  pantoprazole (PROTONIX) 40 MG tablet TAKE 1 TABLET BY MOUTH EVERY MORNING 30 MINUTES BEFORE BREAKFAST 10/10/20   Hurshel Party C, MD  testosterone cypionate (DEPOTESTOSTERONE CYPIONATE) 200 MG/ML injection INJECT 0.5 MLS INTO THE MUSCLE TWICE WEEKLY Patient taking differently: Inject 100 mg into the muscle 2 (two) times a week. Mondays and Thursdays. 07/15/20   Doree Albee, MD  valACYclovir (VALTREX) 1000 MG tablet Take 1,000 mg by mouth daily.  03/30/20   [provider]    Family History Family History  Problem Relation Age of Onset  . Cancer - Other Father        mesothelioma, deceased at age 81   . Colon cancer Neg Hx     Social History Social History   Tobacco Use  . Smoking status: Never Smoker  . Smokeless tobacco: Never Used  Vaping Use  . Vaping Use: Never used  Substance Use Topics  . Alcohol use:  No    Alcohol/week: 0.0 standard drinks  . Drug use: No     Allergies   Penicillins   Review of Systems Review of Systems   Physical Exam Triage Vital Signs ED Triage Vitals [12/12/20 1204]  Enc Vitals Group     BP (!) 148/105     Pulse Rate 77     Resp 16     Temp 98 F (36.7 C)     Temp Source Oral     SpO2 97 %     Weight      Height      Head Circumference      Peak Flow      Pain Score      Pain Loc      Pain Edu?      Excl. in [place]?    No  data found.  Updated Vital Signs BP (!) 148/105 (BP Location: Right Arm)   Pulse 77   Temp 98 F (36.7 C) (Oral)   Resp 16   SpO2 97%       Physical Exam Vitals and nursing note reviewed.  Constitutional:      General: He is not in acute distress.    Appearance: He is well-developed. He is ill-appearing.  HENT:     Head: Normocephalic and atraumatic.     Right Ear: Tympanic membrane, ear canal and external ear normal.     Left Ear: Tympanic membrane, ear canal and external ear normal.     Nose: Congestion present.     Mouth/Throat:     Mouth: Mucous membranes are moist.     Pharynx: Posterior oropharyngeal erythema present.  Eyes:     Extraocular Movements: Extraocular movements intact.     Conjunctiva/sclera: Conjunctivae normal.     Pupils: Pupils are equal, round, and reactive to light.  Cardiovascular:     Rate and Rhythm: Normal rate and regular rhythm.     Heart sounds: Normal heart sounds. No murmur heard.   Pulmonary:     Effort: Pulmonary effort is normal. No respiratory distress.     Breath sounds: No stridor. Wheezing and rhonchi present. No rales.  Chest:     Chest wall: No tenderness.  Abdominal:     General: There is no distension.     Palpations: Abdomen is soft. There is no mass.     Tenderness: There is abdominal tenderness (left flank). There is guarding (left flank). There is no right CVA tenderness, left CVA tenderness or rebound.     Hernia: No hernia is present.  Musculoskeletal:      Cervical back: Normal range of motion and neck supple.  Lymphadenopathy:     Cervical: Cervical adenopathy present.  Skin:    General: Skin is warm and dry.     Capillary Refill: Capillary refill takes less than 2 seconds.  Neurological:     General: No focal deficit present.     Mental Status: He is alert and oriented to person, place, and time.  Psychiatric:        Mood and Affect: Mood normal.        Behavior: Behavior normal.        Thought Content: Thought content normal.      UC Treatments / Results  Labs (all labs ordered are listed, but only abnormal results are displayed) Labs Reviewed - No data to display  EKG   Radiology No results found.  Procedures Procedures (including critical care time)  Medications Ordered in UC Medications - No data to display  Initial Impression / Assessment and Plan / UC Course  I have reviewed the triage vital signs and the nursing notes.  Pertinent labs & imaging results that were available during my care of the patient were reviewed by me and considered in my medical decision making (see chart for details).    URI  Pansinusitis Costochondritis Vertigo Nausea Muscle Strain  Visibly uncomfortable in exam chair Prescribed doxycycline 100mg  BID x 10 days Prescribed steroid taper Prescribed cheratussin cough syrup Sedation precautions given Prescribed meclizine for vertigo Prescribed zofran for nausea associated with vertigo Prescribed flexeril for muscle strain Sedation precautions discussed Declines Covid and flu testing  Discussed that if there are fractured lower ribs that they are difficult to see on xray and that this takes a longer time to heal, and will be  sore for weeks Follow up with this office or with primary care if symptoms are persisting.  Follow up in the ER for high fever, trouble swallowing, trouble breathing, other concerning symptoms.   Final Clinical Impressions(s) / UC Diagnoses   Final  diagnoses:  Upper respiratory tract infection, unspecified type  Acute non-recurrent pansinusitis  Costochondritis  Vertigo  Muscle strain  Nausea without vomiting     Discharge Instructions     I have sent in doxycycline for you to take one tablet twice a day for 10 days  I have sent in a prednisone taper for you to take for 6 days. 6 tablets on day one, 5 tablets on day two, 4 tablets on day three, 3 tablets on day four, 2 tablets on day five, and 1 tablet on day six.  I have sent in cough syrup for you to take. This medication can make you sleepy. Do not drive while taking this medication.  I have sent in Zofran for you to take one tablet every 8 hours as needed for nausea.  I have sent in meclizine for you to take three times per day as needed for vertigo  I have sent in flexeril for you to take twice a day as needed for muscle spasms. This medication can make you sleepy. Do not drive or operate heavy machinery with this medication.  Follow up with this office or with primary care if symptoms are persisting.  Follow up in the ER for high fever, trouble swallowing, trouble breathing, other concerning symptoms.     ED Prescriptions    Medication Sig Dispense Auth. Provider   doxycycline (VIBRAMYCIN) 100 MG capsule Take 1 capsule (100 mg total) by mouth 2 (two) times daily. 20 capsule Faustino Congress, NP   predniSONE (STERAPRED UNI-PAK 21 TAB) 10 MG (21) TBPK tablet Take by mouth daily for 6 days. Take 6 tablets on day 1, 5 tablets on day 2, 4 tablets on day 3, 3 tablets on day 4, 2 tablets on day 5, 1 tablet on day 6 21 tablet Faustino Congress, NP   promethazine-dextromethorphan (PROMETHAZINE-DM) 6.25-15 MG/5ML syrup Take 5 mLs by mouth 4 (four) times daily as needed for cough. 240 mL Faustino Congress, NP   meclizine (ANTIVERT) 25 MG tablet Take 1 tablet (25 mg total) by mouth 3 (three) times daily as needed for dizziness. 30 tablet Faustino Congress, NP    ondansetron (ZOFRAN) 4 MG tablet Take 1 tablet (4 mg total) by mouth every 6 (six) hours. 12 tablet Faustino Congress, NP   cyclobenzaprine (FLEXERIL) 10 MG tablet Take 1 tablet (10 mg total) by mouth 2 (two) times daily as needed for muscle spasms. 20 tablet Faustino Congress, NP     PDMP not reviewed this encounter.   Faustino Congress, NP 12/14/20 [phone]

## 2020-12-13 ENCOUNTER — Ambulatory Visit (INDEPENDENT_AMBULATORY_CARE_PROVIDER_SITE_OTHER): Payer: 59 | Admitting: Internal Medicine

## 2020-12-14 ENCOUNTER — Ambulatory Visit
Admission: EM | Admit: 2020-12-14 | Discharge: 2020-12-14 | Disposition: A | Discharge status: Home / Self Care | Payer: 59

## 2020-12-14 ENCOUNTER — Encounter: Payer: Self-pay | Admitting: Emergency Medicine

## 2020-12-14 ENCOUNTER — Telehealth (INDEPENDENT_AMBULATORY_CARE_PROVIDER_SITE_OTHER): Payer: Self-pay

## 2020-12-14 ENCOUNTER — Other Ambulatory Visit: Payer: Self-pay

## 2020-12-14 DIAGNOSIS — R0781 Pleurodynia: Secondary | ICD-10-CM | POA: Diagnosis not present

## 2020-12-14 DIAGNOSIS — T148XXA Other injury of unspecified body region, initial encounter: Secondary | ICD-10-CM | POA: Diagnosis not present

## 2020-12-14 DIAGNOSIS — J069 Acute upper respiratory infection, unspecified: Secondary | ICD-10-CM | POA: Diagnosis not present

## 2020-12-14 NOTE — Telephone Encounter (Signed)
.  Transition Care Management Follow-up Telephone Call  Date of discharge and from where: 12/09/20 @ 7:35AM.   How have you been since you were released from the hospital? Yes to home. But pt is back in Urgent care now.  Any questions or concerns? No,  Items Reviewed:  Did the pt receive and understand the discharge instructions provided? Yes   Medications obtained and verified? Yes   Other? Yes   Any new allergies since your discharge? No   Dietary orders reviewed? No  Do you have support at home? Yes   Home Care and Equipment/Supplies: Were home health services ordered? no If so, what is the name of the agency?   Has the agency set up a time to come to the patient's home? not applicable Were any new equipment or medical supplies ordered?  No What is the name of the medical supply agency?  Were you able to get the supplies/equipment? not applicable Do you have any questions related to the use of the equipment or supplies? No  Functional Questionnaire: (I = Independent and D = Dependent) ADLs: I  Bathing/Dressing- I  Meal Prep- D  Eating- I  Maintaining continence- I  Transferring/Ambulation- I  Managing Meds- I  Follow up appointments reviewed:   PCP Hospital f/u appt confirmed? No  Scheduled to see  on  @ - NO OPENING UPCOMING SET.  [facility] f/u appt confirmed? No  Scheduled to see  on  @ .  Are transportation arrangements needed? No   If their condition worsens, is the pt aware to call PCP or go to the Emergency Dept.? Yes  Was the patient provided with contact information for the PCP's office or ED? Yes  Was to pt encouraged to call back with questions or concerns? Yes

## 2020-12-14 NOTE — ED Triage Notes (Signed)
Left side pain since Thursday

## 2020-12-14 NOTE — Discharge Instructions (Addendum)
Note provided for return to work on Sunday, send leave paper work to Dr Anastasio Champion  Continue medication regimen  Follow up with this office or with primary care if symptoms are persisting.  Follow up in the ER for high fever, trouble swallowing, trouble breathing, other concerning symptoms.

## 2020-12-15 NOTE — ED Provider Notes (Signed)
[facility]    CSN: [id number] Arrival date & time: 12/14/20  1036      History   Chief Complaint No chief complaint on file.   HPI James Blankenship is a 55 y.o. male.   Reports that he is still having some left flank pain and some cough. Reports that his sinusitis has improved. Was seen in this office on 12/12/20 and was treated for URI and muscle strain. Reports that he still needs a note for work as he does not feel that he can adequately perform job duties of bending and squatting, with frequent position changes at work. Denies new symptoms including headache, abdominal pain, nausea, vomiting, diarrhea, rash, fever, other symptoms.   ROS per HPI  The history is provided by the patient.   Past Medical History:  Diagnosis Date   AP (abdominal pain)    Chest pain    Chest pressure    Chronic intermittent hypoxia with obstructive sleep apnea    Chronic nasal congestion    Circadian rhythm sleep disorder    Diaphoresis    Dysphagia    Gastritis    GERD (gastroesophageal reflux disease)    GERD (gastroesophageal reflux disease)    Hand tingling    Male hypogonadism 03/23/2019   Overbite    Sleep apnea    Vitamin D deficiency disease 03/23/2019    Patient Active Problem List   Diagnosis Date Noted   Chest pain of uncertain etiology 27/78/2423   Essential hypertension 05/19/2020   OSA (obstructive sleep apnea) 05/19/2020   Hyperlipidemia 05/19/2020   SOB (shortness of breath) 05/19/2020   Vitamin D deficiency disease 03/23/2019   Male hypogonadism 03/23/2019   Sleep apnea    Chronic nasal congestion 07/23/2018   Circadian rhythm sleep disorder, shift work type 07/23/2018   Overbite, excessive 07/23/2018   Diaphoresis 07/23/2018   Chronic intermittent hypoxia with obstructive sleep apnea 07/23/2018   Severe obstructive sleep apnea 07/23/2018   Dysphagia 09/28/2015   Encounter for screening colonoscopy 09/28/2015   AP (abdominal pain) 09/28/2015   DYSPHAGIA  04/19/2010   GERD 10/07/2009    Past Surgical History:  Procedure Laterality Date   BIOPSY  11/01/2015   Procedure: BIOPSY;  Surgeon: Danie Binder, MD;  Location: [facility];  Service: Endoscopy;;  gastric   CHOLECYSTECTOMY     COLONOSCOPY WITH PROPOFOL N/A 11/01/2015   Dr. Oneida Alar: internal hemorrhoids, otherwise normal.    ESOPHAGOGASTRODUODENOSCOPY  06/10/2009   Distal esophagea stricture dialted to 79mm. Bx from distal esophagus (neg). erythema in body and antrum.   ESOPHAGOGASTRODUODENOSCOPY  05/30/2009   distal peptic stricture dialted to 82mm, diffuse bx proven gastritis, no .hpylori   ESOPHAGOGASTRODUODENOSCOPY (EGD) WITH PROPOFOL N/A 11/01/2015   Dr. Oneida Alar: benign-appearing esophageal stenosis s/p dilation, chronic gastritis    HERNIA REPAIR Left    groin   SAVORY DILATION N/A 11/01/2015   Procedure: SAVORY DILATION;  Surgeon: Danie Binder, MD;  Location: [facility];  Service: Endoscopy;  Laterality: N/A;   VASECTOMY         Home Medications    Prior to Admission medications   Medication Sig Start Date End Date Taking? Authorizing Provider  benzonatate (TESSALON) 200 MG capsule Take 1 capsule (200 mg total) by mouth 3 (three) times daily as needed for cough. 12/09/20   Orpah Greek, MD  Cholecalciferol (VITAMIN D-3) 125 MCG (5000 UT) TABS Take 10,000 Units by mouth daily at 12 noon.    [provider]  cyclobenzaprine (FLEXERIL) 10 MG tablet Take 1 tablet (10 mg total) by mouth 2 (two) times daily as needed for muscle spasms. 12/12/20   Faustino Congress, NP  doxycycline (VIBRAMYCIN) 100 MG capsule Take 1 capsule (100 mg total) by mouth 2 (two) times daily. 12/12/20   Faustino Congress, NP  fluticasone (FLONASE) 50 MCG/ACT nasal spray Place 1 spray into both nostrils daily as needed for allergies or rhinitis. 09/10/19   Petrucelli, Glynda Jaeger, PA-C  HYDROcodone-acetaminophen (NORCO/VICODIN) 5-325 MG tablet Take 1 tablet by mouth every 4 (four) hours  as needed for moderate pain. 12/09/20   Orpah Greek, MD  loratadine (CLARITIN) 10 MG tablet Take 10 mg by mouth daily.    [provider]  meclizine (ANTIVERT) 25 MG tablet Take 1 tablet (25 mg total) by mouth 3 (three) times daily as needed for dizziness. 12/12/20   Faustino Congress, NP  ondansetron (ZOFRAN) 4 MG tablet Take 1 tablet (4 mg total) by mouth every 6 (six) hours. 12/12/20   Faustino Congress, NP  pantoprazole (PROTONIX) 40 MG tablet TAKE 1 TABLET BY MOUTH EVERY MORNING 30 MINUTES BEFORE BREAKFAST 10/10/20   Gosrani, Nimish C, MD  predniSONE (STERAPRED UNI-PAK 21 TAB) 10 MG (21) TBPK tablet Take by mouth daily for 6 days. Take 6 tablets on day 1, 5 tablets on day 2, 4 tablets on day 3, 3 tablets on day 4, 2 tablets on day 5, 1 tablet on day 6 12/12/20 12/18/20  Faustino Congress, NP  promethazine-dextromethorphan (PROMETHAZINE-DM) 6.25-15 MG/5ML syrup Take 5 mLs by mouth 4 (four) times daily as needed for cough. 12/12/20   Faustino Congress, NP  testosterone cypionate (DEPOTESTOSTERONE CYPIONATE) 200 MG/ML injection INJECT 0.5 MLS INTO THE MUSCLE TWICE WEEKLY Patient taking differently: Inject 100 mg into the muscle 2 (two) times a week. Mondays and Thursdays. 07/15/20   Doree Albee, MD  valACYclovir (VALTREX) 1000 MG tablet Take 1,000 mg by mouth daily.  03/30/20   [provider]    Family History Family History  Problem Relation Age of Onset   Cancer - Other Father        mesothelioma, deceased at age 12    Colon cancer Neg Hx     Social History Social History   Tobacco Use   Smoking status: Never   Smokeless tobacco: Never  Vaping Use   Vaping Use: Never used  Substance Use Topics   Alcohol use: No    Alcohol/week: 0.0 standard drinks   Drug use: No     Allergies   Penicillins   Review of Systems Review of Systems   Physical Exam Triage Vital Signs ED Triage Vitals  Enc Vitals Group     BP 12/14/20 1128 (!) 154/104     Pulse  Rate 12/14/20 1128 98     Resp 12/14/20 1128 18     Temp 12/14/20 1128 98.9 F (37.2 C)     Temp Source 12/14/20 1128 Oral     SpO2 12/14/20 1128 97 %     Weight --      Height --      Head Circumference --      Peak Flow --      Pain Score 12/14/20 1127 7     Pain Loc --      Pain Edu? --      Excl. in [place]? --    No data found.  Updated Vital Signs BP (!) 154/104 (BP Location: Right Arm)  Pulse 98   Temp 98.9 F (37.2 C) (Oral)   Resp 18   SpO2 97%   Visual Acuity Right Eye Distance:   Left Eye Distance:   Bilateral Distance:    Right Eye Near:   Left Eye Near:    Bilateral Near:     Physical Exam Vitals and nursing note reviewed.  Constitutional:      Appearance: He is well-developed.  HENT:     Head: Normocephalic and atraumatic.     Nose: Congestion present.     Mouth/Throat:     Mouth: Mucous membranes are moist.     Pharynx: Posterior oropharyngeal erythema present.  Eyes:     Extraocular Movements: Extraocular movements intact.     Conjunctiva/sclera: Conjunctivae normal.     Pupils: Pupils are equal, round, and reactive to light.  Cardiovascular:     Rate and Rhythm: Normal rate and regular rhythm.     Heart sounds: Normal heart sounds. No murmur heard. Pulmonary:     Effort: Pulmonary effort is normal. No respiratory distress.     Breath sounds: Normal breath sounds.  Abdominal:     General: Bowel sounds are normal. There is no distension.     Palpations: Abdomen is soft. There is no mass.     Tenderness: There is abdominal tenderness (left abdomen and flank). There is no right CVA tenderness, left CVA tenderness, guarding or rebound.     Hernia: No hernia is present.  Musculoskeletal:     Cervical back: Normal range of motion and neck supple.  Lymphadenopathy:     Cervical: No cervical adenopathy.  Skin:    General: Skin is warm and dry.     Capillary Refill: Capillary refill takes less than 2 seconds.  Neurological:     General: No focal  deficit present.     Mental Status: He is alert and oriented to person, place, and time.  Psychiatric:        Mood and Affect: Mood normal.        Behavior: Behavior normal.        Thought Content: Thought content normal.     UC Treatments / Results  Labs (all labs ordered are listed, but only abnormal results are displayed) Labs Reviewed - No data to display  EKG   Radiology No results found.  Procedures Procedures (including critical care time)  Medications Ordered in UC Medications - No data to display  Initial Impression / Assessment and Plan / UC Course  I have reviewed the triage vital signs and the nursing notes.  Pertinent labs & imaging results that were available during my care of the patient were reviewed by me and considered in my medical decision making (see chart for details).    Left side pain URI Muscle Strain  Still healing from muscle strain, but improving Cough and sinusitis are also improving Agree that going back to an active job at this time will delay healing Work note provided Continue medication regimen from last visit Follow up with this office or with primary care if symptoms are persisting.  Follow up in the ER for high fever, trouble swallowing, trouble breathing, other concerning symptoms.   Final Clinical Impressions(s) / UC Diagnoses   Final diagnoses:  Rib pain on left side  Muscle strain  Upper respiratory tract infection, unspecified type     Discharge Instructions      Note provided for return to work on Sunday, send leave paper work to Dr Anastasio Champion  Continue medication regimen  Follow up with this office or with primary care if symptoms are persisting.  Follow up in the ER for high fever, trouble swallowing, trouble breathing, other concerning symptoms.      ED Prescriptions   None    PDMP not reviewed this encounter.   Faustino Congress, NP 12/15/20 [phone]

## 2020-12-17 ENCOUNTER — Other Ambulatory Visit: Payer: Self-pay

## 2020-12-17 ENCOUNTER — Emergency Department (HOSPITAL_BASED_OUTPATIENT_CLINIC_OR_DEPARTMENT_OTHER): Payer: 59 | Admitting: Radiology

## 2020-12-17 ENCOUNTER — Ambulatory Visit: Admission: EM | Admit: 2020-12-17 | Discharge: 2020-12-17 | Payer: 59

## 2020-12-17 ENCOUNTER — Emergency Department (HOSPITAL_BASED_OUTPATIENT_CLINIC_OR_DEPARTMENT_OTHER)
Admission: EM | Admit: 2020-12-17 | Discharge: 2020-12-18 | Disposition: A | Discharge status: Home / Self Care | Payer: 59 | Attending: Emergency Medicine | Admitting: Emergency Medicine

## 2020-12-17 ENCOUNTER — Encounter (HOSPITAL_BASED_OUTPATIENT_CLINIC_OR_DEPARTMENT_OTHER): Payer: Self-pay | Admitting: Emergency Medicine

## 2020-12-17 DIAGNOSIS — R109 Unspecified abdominal pain: Secondary | ICD-10-CM

## 2020-12-17 DIAGNOSIS — R0981 Nasal congestion: Secondary | ICD-10-CM | POA: Diagnosis not present

## 2020-12-17 DIAGNOSIS — X58XXXA Exposure to other specified factors, initial encounter: Secondary | ICD-10-CM | POA: Insufficient documentation

## 2020-12-17 DIAGNOSIS — T148XXA Other injury of unspecified body region, initial encounter: Secondary | ICD-10-CM

## 2020-12-17 DIAGNOSIS — R0789 Other chest pain: Secondary | ICD-10-CM | POA: Insufficient documentation

## 2020-12-17 DIAGNOSIS — R059 Cough, unspecified: Secondary | ICD-10-CM | POA: Diagnosis not present

## 2020-12-17 DIAGNOSIS — I10 Essential (primary) hypertension: Secondary | ICD-10-CM | POA: Diagnosis not present

## 2020-12-17 DIAGNOSIS — S39011A Strain of muscle, fascia and tendon of abdomen, initial encounter: Secondary | ICD-10-CM | POA: Insufficient documentation

## 2020-12-17 DIAGNOSIS — S3991XA Unspecified injury of abdomen, initial encounter: Secondary | ICD-10-CM | POA: Diagnosis present

## 2020-12-17 NOTE — ED Triage Notes (Signed)
Pt complains of pain to his ribs on his right side for 10 days along with a cough.  Pt is unsure how his ribs have been injured. Pt thinks the pain started after sneezing real hard.

## 2020-12-18 ENCOUNTER — Emergency Department (HOSPITAL_BASED_OUTPATIENT_CLINIC_OR_DEPARTMENT_OTHER): Payer: 59

## 2020-12-18 LAB — COMPREHENSIVE METABOLIC PANEL WITH GFR
ALT: 56 U/L — ABNORMAL HIGH (ref 0–44)
AST: 19 U/L (ref 15–41)
Albumin: 3.9 g/dL (ref 3.5–5.0)
Alkaline Phosphatase: 50 U/L (ref 38–126)
Anion gap: 8 (ref 5–15)
BUN: 23 mg/dL — ABNORMAL HIGH (ref 6–20)
CO2: 28 mmol/L (ref 22–32)
Calcium: 8.9 mg/dL (ref 8.9–10.3)
Chloride: 101 mmol/L (ref 98–111)
Creatinine, Ser: 1.21 mg/dL (ref 0.61–1.24)
GFR, Estimated: >60 mL/min (ref >60)
Glucose, Bld: 96 mg/dL (ref 70–99)
Potassium: 4.3 mmol/L (ref 3.5–5.1)
Sodium: 137 mmol/L (ref 135–145)
Total Bilirubin: 0.4 mg/dL (ref 0.3–1.2)
Total Protein: 6.7 g/dL (ref 6.5–8.1)

## 2020-12-18 LAB — URINALYSIS, ROUTINE W REFLEX MICROSCOPIC
Bilirubin Urine: NEGATIVE
Glucose, UA: NEGATIVE mg/dL
Hgb urine dipstick: NEGATIVE
Ketones, ur: NEGATIVE mg/dL
Leukocytes,Ua: NEGATIVE
Nitrite: NEGATIVE
Specific Gravity, Urine: 1.021 (ref 1.005–1.030)
pH: 6 (ref 5.0–8.0)

## 2020-12-18 LAB — CBC WITH DIFFERENTIAL/PLATELET
Abs Immature Granulocytes: 0.18 10*3/uL — ABNORMAL HIGH (ref 0.00–0.07)
Basophils Absolute: 0.1 10*3/uL (ref 0.0–0.1)
Basophils Relative: 1 %
Eosinophils Absolute: 0.1 10*3/uL (ref 0.0–0.5)
Eosinophils Relative: 1 %
HCT: 46.7 % (ref 39.0–52.0)
Hemoglobin: 15.6 g/dL (ref 13.0–17.0)
Immature Granulocytes: 2 %
Lymphocytes Relative: 29 %
Lymphs Abs: 2.6 10*3/uL (ref 0.7–4.0)
MCH: 30.2 pg (ref 26.0–34.0)
MCHC: 33.4 g/dL (ref 30.0–36.0)
MCV: 90.5 fL (ref 80.0–100.0)
Monocytes Absolute: 0.8 10*3/uL (ref 0.1–1.0)
Monocytes Relative: 9 %
Neutro Abs: 5.2 10*3/uL (ref 1.7–7.7)
Neutrophils Relative %: 58 %
Platelets: 224 10*3/uL (ref 150–400)
RBC: 5.16 MIL/uL (ref 4.22–5.81)
RDW: 13.9 % (ref 11.5–15.5)
WBC: 8.9 10*3/uL (ref 4.0–10.5)
nRBC: 0 % (ref 0.0–0.2)

## 2020-12-18 LAB — LIPASE, BLOOD: Lipase: 25 U/L (ref 11–51)

## 2020-12-18 LAB — TROPONIN I (HIGH SENSITIVITY): Troponin I (High Sensitivity): 3 ng/L (ref <18)

## 2020-12-18 MED ORDER — KETOROLAC TROMETHAMINE 15 MG/ML IJ SOLN
15.0000 mg | Freq: Once | INTRAMUSCULAR | Status: AC
  Administered 2020-12-18: 15 mg via INTRAVENOUS
  Filled 2020-12-18: qty 1

## 2020-12-18 MED ORDER — KETOROLAC TROMETHAMINE 60 MG/2ML IM SOLN: 30.0000 mg | Freq: Once | INTRAMUSCULAR | Status: DC

## 2020-12-18 NOTE — ED Provider Notes (Signed)
[facility] Provider Note   CSN: [id number] Arrival date & time: 12/17/20  1532     History Chief Complaint  Patient presents with   Flank Pain    James Blankenship is a 55 y.o. male.  Presents to ER with concern for flank pain.  Patient states that for the past couple weeks he has been suffering from cough, sinus infection.  Has completed course of antibiotics as prescribed by urgent care.  Also given muscle relaxer which has not helped.  States pain occurs primarily when coughing, all on his left side.  Sharp and stabbing, nonradiating.  No difficulty in breathing.  No fevers.  Still feels somewhat congested but this has improved some.  HPI     Past Medical History:  Diagnosis Date   AP (abdominal pain)    Chest pain    Chest pressure    Chronic intermittent hypoxia with obstructive sleep apnea    Chronic nasal congestion    Circadian rhythm sleep disorder    Diaphoresis    Dysphagia    Gastritis    GERD (gastroesophageal reflux disease)    GERD (gastroesophageal reflux disease)    Hand tingling    Male hypogonadism 03/23/2019   Overbite    Sleep apnea    Vitamin D deficiency disease 03/23/2019    Patient Active Problem List   Diagnosis Date Noted   Chest pain of uncertain etiology 32/67/1245   Essential hypertension 05/19/2020   OSA (obstructive sleep apnea) 05/19/2020   Hyperlipidemia 05/19/2020   SOB (shortness of breath) 05/19/2020   Vitamin D deficiency disease 03/23/2019   Male hypogonadism 03/23/2019   Sleep apnea    Chronic nasal congestion 07/23/2018   Circadian rhythm sleep disorder, shift work type 07/23/2018   Overbite, excessive 07/23/2018   Diaphoresis 07/23/2018   Chronic intermittent hypoxia with obstructive sleep apnea 07/23/2018   Severe obstructive sleep apnea 07/23/2018   Dysphagia 09/28/2015   Encounter for screening colonoscopy 09/28/2015   AP (abdominal pain) 09/28/2015   DYSPHAGIA 04/19/2010   GERD 10/07/2009     Past Surgical History:  Procedure Laterality Date   BIOPSY  11/01/2015   Procedure: BIOPSY;  Surgeon: Danie Binder, MD;  [facility]: [facility];  Service: Endoscopy;;  gastric   CHOLECYSTECTOMY     COLONOSCOPY WITH PROPOFOL N/A 11/01/2015   Dr. Oneida Alar: internal hemorrhoids, otherwise normal.    ESOPHAGOGASTRODUODENOSCOPY  06/10/2009   Distal esophagea stricture dialted to 23mm. Bx from distal esophagus (neg). erythema in body and antrum.   ESOPHAGOGASTRODUODENOSCOPY  05/30/2009   distal peptic stricture dialted to 77mm, diffuse bx proven gastritis, no .hpylori   ESOPHAGOGASTRODUODENOSCOPY (EGD) WITH PROPOFOL N/A 11/01/2015   Dr. Oneida Alar: benign-appearing esophageal stenosis s/p dilation, chronic gastritis    HERNIA REPAIR Left    groin   SAVORY DILATION N/A 11/01/2015   Procedure: SAVORY DILATION;  Surgeon: Danie Binder, MD;  Location: [facility];  Service: Endoscopy;  Laterality: N/A;   VASECTOMY         Family History  Problem Relation Age of Onset   Cancer - Other Father        mesothelioma, deceased at age 66    Colon cancer Neg Hx     Social History   Tobacco Use   Smoking status: Never   Smokeless tobacco: Never  Vaping Use   Vaping Use: Never used  Substance Use Topics   Alcohol use: No    Alcohol/week: 0.0 standard drinks  Drug use: No    Home Medications Prior to Admission medications   Medication Sig Start Date End Date Taking? Authorizing Provider  benzonatate (TESSALON) 200 MG capsule Take 1 capsule (200 mg total) by mouth 3 (three) times daily as needed for cough. 12/09/20  Yes Pollina, Gwenyth Allegra, MD  Cholecalciferol (VITAMIN D-3) 125 MCG (5000 UT) TABS Take 10,000 Units by mouth daily at 12 noon.   Yes [provider]  cyclobenzaprine (FLEXERIL) 10 MG tablet Take 1 tablet (10 mg total) by mouth 2 (two) times daily as needed for muscle spasms. 12/12/20  Yes Faustino Congress, NP  doxycycline (VIBRAMYCIN) 100 MG capsule Take 1 capsule  (100 mg total) by mouth 2 (two) times daily. 12/12/20  Yes Faustino Congress, NP  fluticasone (FLONASE) 50 MCG/ACT nasal spray Place 1 spray into both nostrils daily as needed for allergies or rhinitis. 09/10/19  Yes Petrucelli, Samantha R, PA-C  HYDROcodone-acetaminophen (NORCO/VICODIN) 5-325 MG tablet Take 1 tablet by mouth every 4 (four) hours as needed for moderate pain. 12/09/20  Yes Pollina, Gwenyth Allegra, MD  loratadine (CLARITIN) 10 MG tablet Take 10 mg by mouth daily.   Yes [provider]  pantoprazole (PROTONIX) 40 MG tablet TAKE 1 TABLET BY MOUTH EVERY MORNING 30 MINUTES BEFORE BREAKFAST 10/10/20  Yes Gosrani, Nimish C, MD  predniSONE (STERAPRED UNI-PAK 21 TAB) 10 MG (21) TBPK tablet Take by mouth daily for 6 days. Take 6 tablets on day 1, 5 tablets on day 2, 4 tablets on day 3, 3 tablets on day 4, 2 tablets on day 5, 1 tablet on day 6 12/12/20 12/18/20 Yes Faustino Congress, NP  promethazine-dextromethorphan (PROMETHAZINE-DM) 6.25-15 MG/5ML syrup Take 5 mLs by mouth 4 (four) times daily as needed for cough. 12/12/20  Yes Faustino Congress, NP  valACYclovir (VALTREX) 1000 MG tablet Take 1,000 mg by mouth daily.  03/30/20  Yes [provider]  meclizine (ANTIVERT) 25 MG tablet Take 1 tablet (25 mg total) by mouth 3 (three) times daily as needed for dizziness. 12/12/20   Faustino Congress, NP  ondansetron (ZOFRAN) 4 MG tablet Take 1 tablet (4 mg total) by mouth every 6 (six) hours. 12/12/20   Faustino Congress, NP  testosterone cypionate (DEPOTESTOSTERONE CYPIONATE) 200 MG/ML injection INJECT 0.5 MLS INTO THE MUSCLE TWICE WEEKLY Patient taking differently: Inject 100 mg into the muscle 2 (two) times a week. Mondays and Thursdays. 07/15/20   Doree Albee, MD    Allergies    Penicillins  Review of Systems   Review of Systems  Constitutional:  Negative for chills and fever.  HENT:  Positive for sinus pressure. Negative for ear pain and sore throat.   Eyes:  Negative for pain  and visual disturbance.  Respiratory:  Positive for cough. Negative for shortness of breath.   Cardiovascular:  Negative for chest pain and palpitations.  Gastrointestinal:  Negative for abdominal pain and vomiting.  Genitourinary:  Positive for flank pain. Negative for dysuria and hematuria.  Musculoskeletal:  Negative for arthralgias and back pain.  Skin:  Negative for color change and rash.  Neurological:  Negative for seizures and syncope.  All other systems reviewed and are negative.  Physical Exam Updated Vital Signs BP (!) 147/97   Pulse 69   Temp 98.7 F (37.1 C)   Resp 18   Ht 6\' 1"  (1.854 m)   Wt 107 kg   SpO2 97%   BMI 31.14 kg/m   Physical Exam Vitals and nursing note reviewed.  Constitutional:  Appearance: He is well-developed.  HENT:     Head: Normocephalic and atraumatic.  Eyes:     Conjunctiva/sclera: Conjunctivae normal.  Cardiovascular:     Rate and Rhythm: Normal rate and regular rhythm.     Heart sounds: No murmur heard. Pulmonary:     Effort: Pulmonary effort is normal. No respiratory distress.     Breath sounds: Normal breath sounds.     Comments: Some tenderness over the left lower chest wall Abdominal:     Palpations: Abdomen is soft.     Tenderness: There is no abdominal tenderness.     Comments: Some tenderness over the left flank  Musculoskeletal:     Cervical back: Neck supple.  Skin:    General: Skin is warm and dry.  Neurological:     Mental Status: He is alert.    ED Results / Procedures / Treatments   Labs (all labs ordered are listed, but only abnormal results are displayed) Labs Reviewed  URINALYSIS, ROUTINE W REFLEX MICROSCOPIC - Abnormal; Notable for the following components:      Result Value   Protein, ur TRACE (*)    All other components within normal limits  CBC WITH DIFFERENTIAL/PLATELET - Abnormal; Notable for the following components:   Abs Immature Granulocytes 0.18 (*)    All other components within normal  limits  COMPREHENSIVE METABOLIC PANEL - Abnormal; Notable for the following components:   BUN 23 (*)    ALT 56 (*)    All other components within normal limits  LIPASE, BLOOD  TROPONIN I (HIGH SENSITIVITY)    EKG None  Radiology CT ABDOMEN PELVIS WO CONTRAST  Result Date: 12/18/2020 CLINICAL DATA:  Right rib pain for 10 days, cough EXAM: CT ABDOMEN AND PELVIS WITHOUT CONTRAST TECHNIQUE: Multidetector CT imaging of the abdomen and pelvis was performed following the standard protocol without IV contrast. COMPARISON:  CT 10/03/2015 FINDINGS: Lower chest: Lung bases are clear. Normal heart size. No pericardial effusion. Lung bases are clear. Normal heart size. No pericardial effusion. No visible rib fractures or lower chest wall abnormality. Hepatobiliary: No visible focal liver lesions within limitations of an unenhanced CT. Smooth liver surface contour. Normal hepatic attenuation. Prior cholecystectomy. No significant biliary ductal dilatation or visible intraductal gallstones. Pancreas: No pancreatic ductal dilatation or surrounding inflammatory changes. Spleen: Normal in size. No concerning splenic lesions. Adrenals/Urinary Tract: Normal adrenal glands. Mild bilateral perinephric stranding is similar to prior, nonspecific finding. No visible or contour deforming renal lesion. No visible urolithiasis or hydronephrosis. Scattered pelvic phleboliths appear external to the ureters and bladder. Urinary bladder is free of acute abnormality. No visible bladder calculi or debris. Stomach/Bowel: Distal esophagus, stomach and duodenal sweep are unremarkable. No small bowel wall thickening or dilatation. No evidence of obstruction. A normal appendix is visualized. No colonic dilatation or wall thickening. Vascular/Lymphatic: Minimal atherosclerotic plaque in the left common and bilateral internal iliac arteries. Retroaortic left renal vein. No other significant vascular findings. No aneurysm or ectasia. No  suspicious or enlarged lymph nodes in the included lymphatic chains. Reproductive: The prostate and seminal vesicles are unremarkable. Other: No abdominopelvic free fluid or free gas. No bowel containing hernias. Musculoskeletal: No acute osseous abnormality or suspicious osseous lesion. Mild degenerative changes spine, hips and pelvis. IMPRESSION: 1. Clear lung bases without lower rib fracture or other acute abnormality of the lower chest wall. 2. Mild bilateral perinephric stranding, unchanged from prior. Nonspecific, can be seen with advanced age or diminished renal function. Should exclude urinary symptoms,  with urinalysis if present. 3. Prior cholecystectomy. 4. No other acute or significant CT abnormality to provide cause for patient's symptoms. Electronically Signed   By: Lovena Le M.D.   On: 12/18/2020 00:39   DG Chest 2 View  Result Date: 12/17/2020 CLINICAL DATA:  Right rib pain, cough EXAM: CHEST - 2 VIEW COMPARISON:  12/09/2020 FINDINGS: Heart and mediastinal contours are within normal limits. No focal opacities or effusions. No acute bony abnormality. No visible rib fracture or pneumothorax. IMPRESSION: No active cardiopulmonary disease. Electronically Signed   By: Rolm Baptise M.D.   On: 12/17/2020 20:13    Procedures Procedures   Medications Ordered in ED Medications  ketorolac (TORADOL) 15 MG/ML injection 15 mg (15 mg Intravenous Given 12/18/20 0101)    ED Course  I have reviewed the triage vital signs and the nursing notes.  Pertinent labs & imaging results that were available during my care of the patient were reviewed by me and considered in my medical decision making (see chart for details).    MDM Rules/Calculators/A&P                          55 year old male presenting to ER with concern for left flank pain, lower chest wall pain in the setting of frequent cough.  On exam he appears well in no distress with stable vitals.  Had tenderness over his lateral lower chest  wall.  Plain films were negative.  EKG okay, troponin within normal limits, doubt ACS.  Basic labs stable.  CT scan negative for acute process.  UA negative for infection.  Suspect MSK strain from frequent coughing.  Recommend follow-up with primary doctor and discharged home with wife.   After the discussed management above, the patient was determined to be safe for discharge.  The patient was in agreement with this plan and all questions regarding their care were answered.  ED return precautions were discussed and the patient will return to the ED with any significant worsening of condition.  Final Clinical Impression(s) / ED Diagnoses Final diagnoses:  Muscle strain  Flank pain    Rx / DC Orders ED Discharge Orders     None        Lucrezia Starch, MD 12/18/20 [id number]

## 2020-12-18 NOTE — Discharge Instructions (Addendum)
Follow-up with your primary care doctor for recheck early this coming week.  Continue your current regimen for pain.  Recommend also doing warm compresses.  Return to ER for worsening chest pain, difficulty breathing, or other new concerning symptom.

## 2020-12-19 ENCOUNTER — Encounter (INDEPENDENT_AMBULATORY_CARE_PROVIDER_SITE_OTHER): Payer: Self-pay | Admitting: Internal Medicine

## 2020-12-19 ENCOUNTER — Telehealth (INDEPENDENT_AMBULATORY_CARE_PROVIDER_SITE_OTHER): Payer: Self-pay

## 2020-12-19 ENCOUNTER — Other Ambulatory Visit: Payer: Self-pay

## 2020-12-19 ENCOUNTER — Ambulatory Visit (INDEPENDENT_AMBULATORY_CARE_PROVIDER_SITE_OTHER): Payer: 59 | Admitting: Internal Medicine

## 2020-12-19 VITALS — BP 161/100 | HR 102 | Temp 97.7°F | Resp 18 | Ht 73.0 in | Wt 226.8 lb

## 2020-12-19 DIAGNOSIS — R059 Cough, unspecified: Secondary | ICD-10-CM

## 2020-12-19 DIAGNOSIS — R0782 Intercostal pain: Secondary | ICD-10-CM | POA: Diagnosis not present

## 2020-12-19 DIAGNOSIS — J329 Chronic sinusitis, unspecified: Secondary | ICD-10-CM | POA: Diagnosis not present

## 2020-12-19 MED ORDER — LEVOFLOXACIN 500 MG PO TABS: 500.0000 mg | ORAL_TABLET | Freq: Every day | ORAL | 0 refills | Status: AC

## 2020-12-19 MED ORDER — PROMETHAZINE-DM 6.25-15 MG/5ML PO SYRP: 5.0000 mL | ORAL_SOLUTION | Freq: Four times a day (QID) | ORAL | 0 refills | Status: DC | PRN

## 2020-12-19 NOTE — Progress Notes (Signed)
Metrics: Intervention Frequency ACO  Documented Smoking Status Yearly  Screened one or more times in 24 months  Cessation Counseling or  Active cessation medication Past 24 months  Past 24 months   Guideline developer: UpToDate (See UpToDate for funding source) Date Released: 2014       Wellness Office Visit  Subjective:  Patient ID: James Blankenship, male    DOB: 06/25/1966  Age: 55 y.o. MRN: [id number]  CC: Sinus congestion, cough, left rib pain. HPI  This patient continues to have left-sided chest pain which is muscular in nature.  He did have x-rays of the ribs and also CT scan of the abdomen which looked at the ribs and there is no fracture. He continues to have cough productive of brown/green sputum and he feels congested in his sinuses. He feels he is unable to work. Past Medical History:  Diagnosis Date   AP (abdominal pain)    Chest pain    Chest pressure    Chronic intermittent hypoxia with obstructive sleep apnea    Chronic nasal congestion    Circadian rhythm sleep disorder    Diaphoresis    Dysphagia    Gastritis    GERD (gastroesophageal reflux disease)    GERD (gastroesophageal reflux disease)    Hand tingling    Male hypogonadism 03/23/2019   Overbite    Sleep apnea    Vitamin D deficiency disease 03/23/2019   Past Surgical History:  Procedure Laterality Date   BIOPSY  11/01/2015   Procedure: BIOPSY;  Surgeon: Danie Binder, MD;  [facility];  Service: Endoscopy;;  gastric   CHOLECYSTECTOMY     COLONOSCOPY WITH PROPOFOL N/A 11/01/2015   Dr. Oneida Alar: internal hemorrhoids, otherwise normal.    ESOPHAGOGASTRODUODENOSCOPY  06/10/2009   Distal esophagea stricture dialted to 62mm. Bx from distal esophagus (neg). erythema in body and antrum.   ESOPHAGOGASTRODUODENOSCOPY  05/30/2009   distal peptic stricture dialted to 13mm, diffuse bx proven gastritis, no .hpylori   ESOPHAGOGASTRODUODENOSCOPY (EGD) WITH PROPOFOL N/A 11/01/2015   Dr. Oneida Alar:  benign-appearing esophageal stenosis s/p dilation, chronic gastritis    HERNIA REPAIR Left    groin   SAVORY DILATION N/A 11/01/2015   Procedure: SAVORY DILATION;  Surgeon: Danie Binder, MD;  [facility];  Service: Endoscopy;  Laterality: N/A;   VASECTOMY       Family History  Problem Relation Age of Onset   Cancer - Other Father        mesothelioma, deceased at age 35    Colon cancer Neg Hx     Social History   Social History Narrative   Married since 2019,lives with wife and extended family.Works at [employer].   Social History   Tobacco Use   Smoking status: Never   Smokeless tobacco: Never  Substance Use Topics   Alcohol use: No    Alcohol/week: 0.0 standard drinks    Current Meds  Medication Sig   Cholecalciferol (VITAMIN D-3) 125 MCG (5000 UT) TABS Take 10,000 Units by mouth daily at 12 noon.   cyclobenzaprine (FLEXERIL) 10 MG tablet Take 1 tablet (10 mg total) by mouth 2 (two) times daily as needed for muscle spasms.   doxycycline (VIBRAMYCIN) 100 MG capsule Take 1 capsule (100 mg total) by mouth 2 (two) times daily.   fluticasone (FLONASE) 50 MCG/ACT nasal spray Place 1 spray into both nostrils daily as needed for allergies or rhinitis.   ibuprofen (ADVIL) 600 MG tablet Take 600 mg by  mouth in the morning and at bedtime. ALTERNATE WITH CYCLOBENZAPRINE 10 MG.   levofloxacin (LEVAQUIN) 500 MG tablet Take 1 tablet (500 mg total) by mouth daily for 7 days.   loratadine (CLARITIN) 10 MG tablet Take 10 mg by mouth daily.   pantoprazole (PROTONIX) 40 MG tablet TAKE 1 TABLET BY MOUTH EVERY MORNING 30 MINUTES BEFORE BREAKFAST   testosterone cypionate (DEPOTESTOSTERONE CYPIONATE) 200 MG/ML injection INJECT 0.5 MLS INTO THE MUSCLE TWICE WEEKLY (Patient taking differently: Inject 100 mg into the muscle 2 (two) times a week. Mondays and Thursdays.)   valACYclovir (VALTREX) 1000 MG tablet Take 1,000 mg by mouth daily.    [DISCONTINUED]  promethazine-dextromethorphan (PROMETHAZINE-DM) 6.25-15 MG/5ML syrup Take 5 mLs by mouth 4 (four) times daily as needed for cough.     [facility] Visit from 08/10/2020 in [facility]  PHQ-9 Total Score 0       Objective:   Today's Vitals: BP (!) 161/100 (BP Location: Left Arm, Patient Position: Sitting, Cuff Size: Normal)   Pulse (!) 102   Temp 97.7 F (36.5 C) (Temporal)   Resp 18   Ht 6\' 1"  (1.854 m)   Wt 226 lb 12.8 oz (102.9 kg)   SpO2 96%   BMI 29.92 kg/m  Vitals with BMI 12/19/2020 12/18/2020 12/17/2020  Height 6\' 1"  - -  Weight 226 lbs 13 oz - -  BMI 49.67 - -  Systolic 591 638 466  Diastolic 599 97 357  Pulse 102 69 89     Physical Exam  Examination of his chest shows some tenderness in the left chest wall, consistent with his area of pain.     Assessment   1. Cough   2. Intercostal pain   3. Other sinusitis, unspecified chronicity       Tests ordered No orders of the defined types were placed in this encounter.    Plan: 1.  He will continue with dextromethorphan/promethazine for cough. 2.  I am going to switch his antibiotic to Levaquin to see if this will help his sinus infection.  He is allergic to penicillins. 3.  Follow-up as previously scheduled.    Meds ordered this encounter  Medications   promethazine-dextromethorphan (PROMETHAZINE-DM) 6.25-15 MG/5ML syrup    Sig: Take 5 mLs by mouth 4 (four) times daily as needed for cough.    Dispense:  240 mL    Refill:  0   levofloxacin (LEVAQUIN) 500 MG tablet    Sig: Take 1 tablet (500 mg total) by mouth daily for 7 days.    Dispense:  7 tablet    Refill:  0     Anique Beckley Luther Parody, MD

## 2020-12-19 NOTE — Telephone Encounter (Signed)
.  Transition Care Management Follow-up Telephone Call Date of discharge and from where: 12/18/20 @ 2:13 AM, wife. How have you been since you were released from the hospital? Home with wife. Any questions or concerns? Yes  Items Reviewed: Did the pt receive and understand the discharge instructions provided? Yes  Medications obtained and verified? Yes  Other? Yes  Any new allergies since your discharge? No  Dietary orders reviewed? No Do you have support at home? Yes   Home Care and Equipment/Supplies: Were home health services ordered? yes If so, what is the name of the agency? N/a  Has the agency set up a time to come to the patient's home? not applicable Were any new equipment or medical supplies ordered?  No What is the name of the medical supply agency? N/a Were you able to get the supplies/equipment? not applicable Do you have any questions related to the use of the equipment or supplies? No  Functional Questionnaire: (I = Independent and D = Dependent) ADLs: I  Bathing/Dressing-I  Meal Prep- D  Eating- I  Maintaining continence- I  Transferring/Ambulation- I  Managing Meds- I  Follow up appointments reviewed:  PCP Hospital f/u appt confirmed? Yes  Scheduled to see DR Anastasio Champion on 12/19/20 @ 10:45 am. [facility] f/u appt confirmed? No  Scheduled to see  on  @ . Are transportation arrangements needed? No  If their condition worsens, is the pt aware to call PCP or go to the Emergency Dept.? Yes Was the patient provided with contact information for the PCP's office or ED? Yes Was to pt encouraged to call back with questions or concerns? Yes

## 2020-12-20 ENCOUNTER — Ambulatory Visit (INDEPENDENT_AMBULATORY_CARE_PROVIDER_SITE_OTHER): Payer: 59 | Admitting: Internal Medicine

## 2020-12-21 ENCOUNTER — Encounter (INDEPENDENT_AMBULATORY_CARE_PROVIDER_SITE_OTHER): Payer: Self-pay | Admitting: Internal Medicine

## 2020-12-27 ENCOUNTER — Ambulatory Visit (HOSPITAL_COMMUNITY): Payer: 59

## 2020-12-28 ENCOUNTER — Other Ambulatory Visit (INDEPENDENT_AMBULATORY_CARE_PROVIDER_SITE_OTHER): Payer: Self-pay | Admitting: Internal Medicine

## 2020-12-28 ENCOUNTER — Telehealth (INDEPENDENT_AMBULATORY_CARE_PROVIDER_SITE_OTHER): Payer: Self-pay

## 2020-12-28 NOTE — Telephone Encounter (Signed)
The Nurse @ [facility] said that he just needs a updated note stating return date to work was extented from 12/24/2020-12/27/20. Then fax back to Her on [phone].  This is due to last forms sent. Needed a updated status dates. 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 So it has to reflect the dates 12/24/2020 to 12/27/2020.

## 2020-12-28 NOTE — Telephone Encounter (Signed)
Can you redo this note or letter? It may be the FMLA forms?

## 2021-02-07 ENCOUNTER — Other Ambulatory Visit (HOSPITAL_COMMUNITY): Payer: Self-pay | Admitting: Nephrology

## 2021-02-07 ENCOUNTER — Other Ambulatory Visit: Payer: Self-pay | Admitting: Nephrology

## 2021-02-07 DIAGNOSIS — G4733 Obstructive sleep apnea (adult) (pediatric): Secondary | ICD-10-CM

## 2021-02-07 DIAGNOSIS — R809 Proteinuria, unspecified: Secondary | ICD-10-CM

## 2021-02-07 DIAGNOSIS — Z79899 Other long term (current) drug therapy: Secondary | ICD-10-CM

## 2021-02-07 DIAGNOSIS — N1831 Chronic kidney disease, stage 3a: Secondary | ICD-10-CM

## 2021-02-07 DIAGNOSIS — E559 Vitamin D deficiency, unspecified: Secondary | ICD-10-CM

## 2021-02-07 DIAGNOSIS — I129 Hypertensive chronic kidney disease with stage 1 through stage 4 chronic kidney disease, or unspecified chronic kidney disease: Secondary | ICD-10-CM

## 2021-02-20 ENCOUNTER — Ambulatory Visit (HOSPITAL_COMMUNITY)
Admission: RE | Admit: 2021-02-20 | Discharge: 2021-02-20 | Disposition: A | Discharge status: Home / Self Care | Payer: 59 | Source: Ambulatory Visit | Attending: Nephrology | Admitting: Nephrology

## 2021-02-20 ENCOUNTER — Other Ambulatory Visit: Payer: Self-pay

## 2021-02-20 DIAGNOSIS — E559 Vitamin D deficiency, unspecified: Secondary | ICD-10-CM | POA: Insufficient documentation

## 2021-02-20 DIAGNOSIS — Z79899 Other long term (current) drug therapy: Secondary | ICD-10-CM | POA: Diagnosis present

## 2021-02-20 DIAGNOSIS — N1831 Chronic kidney disease, stage 3a: Secondary | ICD-10-CM | POA: Insufficient documentation

## 2021-02-20 DIAGNOSIS — I129 Hypertensive chronic kidney disease with stage 1 through stage 4 chronic kidney disease, or unspecified chronic kidney disease: Secondary | ICD-10-CM | POA: Insufficient documentation

## 2021-02-20 DIAGNOSIS — G4733 Obstructive sleep apnea (adult) (pediatric): Secondary | ICD-10-CM | POA: Diagnosis present

## 2021-02-20 DIAGNOSIS — R809 Proteinuria, unspecified: Secondary | ICD-10-CM | POA: Diagnosis present

## 2021-03-01 ENCOUNTER — Other Ambulatory Visit: Payer: Self-pay

## 2021-03-01 ENCOUNTER — Encounter: Payer: Self-pay | Admitting: Nurse Practitioner

## 2021-03-01 ENCOUNTER — Ambulatory Visit (INDEPENDENT_AMBULATORY_CARE_PROVIDER_SITE_OTHER): Payer: 59 | Admitting: Nurse Practitioner

## 2021-03-01 VITALS — BP 135/81 | HR 92 | Temp 98.2°F | Resp 18 | Wt 238.0 lb

## 2021-03-01 DIAGNOSIS — E785 Hyperlipidemia, unspecified: Secondary | ICD-10-CM

## 2021-03-01 DIAGNOSIS — R1310 Dysphagia, unspecified: Secondary | ICD-10-CM

## 2021-03-01 DIAGNOSIS — I10 Essential (primary) hypertension: Secondary | ICD-10-CM | POA: Diagnosis not present

## 2021-03-01 DIAGNOSIS — G4733 Obstructive sleep apnea (adult) (pediatric): Secondary | ICD-10-CM

## 2021-03-01 DIAGNOSIS — E291 Testicular hypofunction: Secondary | ICD-10-CM

## 2021-03-01 DIAGNOSIS — Z139 Encounter for screening, unspecified: Secondary | ICD-10-CM

## 2021-03-01 DIAGNOSIS — N529 Male erectile dysfunction, unspecified: Secondary | ICD-10-CM | POA: Insufficient documentation

## 2021-03-01 DIAGNOSIS — Z7689 Persons encountering health services in other specified circumstances: Secondary | ICD-10-CM

## 2021-03-01 DIAGNOSIS — R918 Other nonspecific abnormal finding of lung field: Secondary | ICD-10-CM

## 2021-03-01 NOTE — Assessment & Plan Note (Signed)
-  has been followed by pulm. -unable to tolerate BiPAP d/t claustrophobia

## 2021-03-01 NOTE — Assessment & Plan Note (Signed)
-  checking labs 

## 2021-03-01 NOTE — Patient Instructions (Signed)
Please have fasting labs drawn 2-3 days prior to your appointment so we can discuss the results during your office visit. When checking testosterone, labs must be drawn before 10 AM, so please be here before 9:30 AM.

## 2021-03-01 NOTE — Assessment & Plan Note (Signed)
Lab Results  Component Value Date   TESTOSTERONE 655 03/23/2019   -check testosterone with next set of labs

## 2021-03-01 NOTE — Assessment & Plan Note (Signed)
-  was Dr. Oneida Alar in the past; last visit around 2017 -if he has issues in the future, will consider GI referral

## 2021-03-01 NOTE — Assessment & Plan Note (Signed)
-  he states his father had mesothelioma, and he is concerned about cancer -last imaging was stable from prior CTA -he would like second opinion with respect to lung nodules and OSA -will refer to pulm

## 2021-03-01 NOTE — Assessment & Plan Note (Signed)
BP Readings from Last 3 Encounters:  03/01/21 135/81  12/19/20 (!) 161/100  12/18/20 (!) 147/97   -doing well today

## 2021-03-01 NOTE — Assessment & Plan Note (Signed)
-  pt desires urology referral; will honor that request

## 2021-03-01 NOTE — Progress Notes (Signed)
New Patient Office Visit  Subjective:  Patient ID: James Blankenship, male    DOB: 07-10-1965  Age: 55 y.o. MRN: [id number]  CC:  Chief Complaint  Patient presents with   Pain    Pt c/o of pain on his left side     HPI James Blankenship presents for new patient visit. Transferring care from Dr. Anastasio Champion. Last physical was over a year ago. Last labs were drawn 12/18/20.  He is followed by Dr. Theador Hawthorne for kidney issues.  He said that kidney were functioning at 49% and he has upcoming appointment.  He uses BiPAP machine for sleep apnea, and he is followed by Dr. Ander Slade.  He saw Dr. Oneida Alar for dysphagia. He had esophageal dilatation and has been doing much better.  He has left flank pain that has started about 2 months ago. He rates the pain at 2-3 and is worse with a cough or with a sneeze. He had CT scan that was negative for rib fx. He stopped taking advil and aleve   He has FMLA for sleep apnea and vertigo; 3 days per month; has to be renewed q6 months.  He has been having issues with erectile dysfunction. He states that Dr. Theador Hawthorne recommended urology, but Dr. Anastasio Champion died before that could be completed. Will request records.  Past Medical History:  Diagnosis Date   AP (abdominal pain)    Chest pain    Chest pressure    Chronic intermittent hypoxia with obstructive sleep apnea    Chronic nasal congestion    Circadian rhythm sleep disorder    Diaphoresis    Dysphagia    Gastritis    GERD (gastroesophageal reflux disease)    GERD (gastroesophageal reflux disease)    Hand tingling    Male hypogonadism 03/23/2019   Overbite    Sleep apnea    Vitamin D deficiency disease 03/23/2019    Past Surgical History:  Procedure Laterality Date   BIOPSY  11/01/2015   Procedure: BIOPSY;  Surgeon: Danie Binder, MD;  [facility];  Service: Endoscopy;;  gastric   CHOLECYSTECTOMY     COLONOSCOPY WITH PROPOFOL N/A 11/01/2015   Dr. Oneida Alar: internal hemorrhoids, otherwise normal.     ESOPHAGOGASTRODUODENOSCOPY  06/10/2009   Distal esophagea stricture dialted to 40m. Bx from distal esophagus (neg). erythema in body and antrum.   ESOPHAGOGASTRODUODENOSCOPY  05/30/2009   distal peptic stricture dialted to 160m diffuse bx proven gastritis, no .hpylori   ESOPHAGOGASTRODUODENOSCOPY (EGD) WITH PROPOFOL N/A 11/01/2015   Dr. FiOneida Alarbenign-appearing esophageal stenosis s/p dilation, chronic gastritis    HERNIA REPAIR Left    groin   SAVORY DILATION N/A 11/01/2015   Procedure: SAVORY DILATION;  Surgeon: SaDanie BinderMD;  [facility];  Service: Endoscopy;  Laterality: N/A;   VASECTOMY      Family History  Problem Relation Age of Onset   Cancer Father    Cancer - Other Father        mesothelioma, deceased at age 56  Colon cancer Neg Hx     Social History   Socioeconomic History   Marital status: Married    Spouse name: Not on file   Number of children: 2   Years of education: Not on file   Highest education level: Not on file  Occupational History    Employer: [employer]   Occupation: [employer]    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   Social Determinants of Health   Financial Resource Strain: Not on file  Food Insecurity: Not on file  Transportation Needs: Not on file  Physical Activity: Not on file  Stress: Not on file  Social Connections: Not on file  Intimate Partner Violence: Not on file    ROS Review of Systems  Constitutional:  Positive for fatigue. Negative for chills and  fever.  Respiratory:         -sleep apnea; unable to tolerate BiPAP overnight -states he has lung nodules, and needs repeat CT about 1 year from now  Cardiovascular: Negative.   Genitourinary:        Erectile dysfunction  Musculoskeletal:  Positive for myalgias.       Left lower rib/flank pain x2 months that is improving; imaging was negative for fx  Psychiatric/Behavioral: Negative.     Objective:   Today's Vitals: BP 135/81   Pulse 92   Temp 98.2 F (36.8 C)   Resp 18   Wt 238 lb 0.6 oz (108 kg)   HC 73" (185.4 cm)   SpO2 95%   BMI 31.41 kg/m   Physical Exam Constitutional:      Appearance: Normal appearance. He is obese.  Cardiovascular:     Rate and Rhythm: Normal rate and regular rhythm.     Pulses: Normal pulses.     Heart sounds: Normal heart sounds.  Pulmonary:     Effort: Pulmonary effort is normal.     Breath sounds: Normal breath sounds.  Musculoskeletal:        General: No tenderness, deformity or signs of injury.  Neurological:     Mental Status: He is alert.  Psychiatric:        Mood and Affect: Mood normal.        Behavior: Behavior normal.        Thought Content: Thought content normal.        Judgment: Judgment normal.    Assessment & Plan:   Problem List Items Addressed This Visit       Cardiovascular and Mediastinum   Essential hypertension    BP Readings from Last 3 Encounters:  03/01/21 135/81  12/19/20 (!) 161/100  12/18/20 (!) 147/97  -doing well today      Relevant Orders   CBC with Differential/Platelet   CMP14+EGFR   Lipid Panel With LDL/HDL Ratio     Respiratory   Severe obstructive sleep apnea    -has been followed by pulm. -unable to tolerate BiPAP d/t claustrophobia       Relevant Orders   Ambulatory referral to Pulmonology     Digestive   Dysphagia    -was Dr. Oneida Alar in the past; last visit around 2017 -if he has issues in the future, will consider GI referral        Endocrine   Male hypogonadism    Lab  Results  Component Value Date   TESTOSTERONE 655 03/23/2019  -check testosterone with next set of labs      Relevant Orders  Testosterone, Free, Total, SHBG   Ambulatory referral to Urology     Other   Hyperlipidemia    -checking labs      Lung nodules    -he states his father had mesothelioma, and he is concerned about cancer -last imaging was stable from prior CTA -he would like second opinion with respect to lung nodules and OSA -will refer to pulm      Relevant Orders   Ambulatory referral to Pulmonology   Erectile dysfunction    -pt desires urology referral; will honor that request      Relevant Orders   Ambulatory referral to Urology   Other Visit Diagnoses     Encounter to establish care    -  Primary   Relevant Orders   CBC with Differential/Platelet   CMP14+EGFR   Lipid Panel With LDL/HDL Ratio   TSH   Screening due       Relevant Orders   Hepatitis C antibody       Outpatient Encounter Medications as of 03/01/2021  Medication Sig   Cholecalciferol (VITAMIN D-3) 125 MCG (5000 UT) TABS Take 10,000 Units by mouth daily at 12 noon.   fluticasone (FLONASE) 50 MCG/ACT nasal spray Place 1 spray into both nostrils daily as needed for allergies or rhinitis.   ibuprofen (ADVIL) 600 MG tablet Take 600 mg by mouth in the morning and at bedtime. ALTERNATE WITH CYCLOBENZAPRINE 10 MG.   loratadine (CLARITIN) 10 MG tablet Take 10 mg by mouth daily.   meclizine (ANTIVERT) 25 MG tablet Take 1 tablet (25 mg total) by mouth 3 (three) times daily as needed for dizziness.   pantoprazole (PROTONIX) 40 MG tablet TAKE 1 TABLET BY MOUTH EVERY MORNING 30 MINUTES BEFORE BREAKFAST   testosterone cypionate (DEPOTESTOSTERONE CYPIONATE) 200 MG/ML injection INJECT 0.5 MLS INTO THE MUSCLE TWICE WEEKLY (Patient taking differently: Inject 100 mg into the muscle 2 (two) times a week. Mondays and Thursdays.)   valACYclovir (VALTREX) 1000 MG tablet Take 1,000 mg by mouth daily.     [DISCONTINUED] benzonatate (TESSALON) 200 MG capsule Take 1 capsule (200 mg total) by mouth 3 (three) times daily as needed for cough. (Patient not taking: Reported on 12/19/2020)   [DISCONTINUED] cyclobenzaprine (FLEXERIL) 10 MG tablet Take 1 tablet (10 mg total) by mouth 2 (two) times daily as needed for muscle spasms.   [DISCONTINUED] doxycycline (VIBRAMYCIN) 100 MG capsule Take 1 capsule (100 mg total) by mouth 2 (two) times daily.   [DISCONTINUED] HYDROcodone-acetaminophen (NORCO/VICODIN) 5-325 MG tablet Take 1 tablet by mouth every 4 (four) hours as needed for moderate pain. (Patient not taking: No sig reported)   [DISCONTINUED] ondansetron (ZOFRAN) 4 MG tablet Take 1 tablet (4 mg total) by mouth every 6 (six) hours. (Patient not taking: Reported on 12/19/2020)   [DISCONTINUED] promethazine-dextromethorphan (PROMETHAZINE-DM) 6.25-15 MG/5ML syrup Take 5 mLs by mouth 4 (four) times daily as needed for cough.   No facility-administered encounter medications on file as of 03/01/2021.    Follow-up: Return in about 2 weeks (around 03/15/2021) for Lab follow-up (HLD, testosterone, HTN).   Noreene Larsson, NP

## 2021-03-15 NOTE — Progress Notes (Signed)
Labs are stable. Testosterone was low. We will discuss that at his next appt.

## 2021-03-17 LAB — CMP14+EGFR
ALT: 45 IU/L — ABNORMAL HIGH (ref 0–44)
AST: 28 IU/L (ref 0–40)
Albumin/Globulin Ratio: 2.1 (ref 1.2–2.2)
Albumin: 4.7 g/dL (ref 3.8–4.9)
Alkaline Phosphatase: 64 IU/L (ref 44–121)
BUN/Creatinine Ratio: 14 (ref 9–20)
BUN: 20 mg/dL (ref 6–24)
Bilirubin Total: 0.6 mg/dL (ref 0.0–1.2)
CO2: 25 mmol/L (ref 20–29)
Calcium: 9.3 mg/dL (ref 8.7–10.2)
Chloride: 101 mmol/L (ref 96–106)
Creatinine, Ser: 1.38 mg/dL — ABNORMAL HIGH (ref 0.76–1.27)
Globulin, Total: 2.2 g/dL (ref 1.5–4.5)
Glucose: 88 mg/dL (ref 65–99)
Potassium: 4.5 mmol/L (ref 3.5–5.2)
Sodium: 138 mmol/L (ref 134–144)
Total Protein: 6.9 g/dL (ref 6.0–8.5)
eGFR: 60 mL/min/{1.73_m2} (ref >59)

## 2021-03-17 LAB — TESTOSTERONE, FREE, TOTAL, SHBG
Sex Hormone Binding: 21.6 nmol/L (ref 19.3–76.4)
Testosterone, Free: 6.7 pg/mL — ABNORMAL LOW (ref 7.2–24.0)
Testosterone: 237 ng/dL — ABNORMAL LOW (ref 264–916)

## 2021-03-17 LAB — LIPID PANEL WITH LDL/HDL RATIO
Cholesterol, Total: 183 mg/dL (ref 100–199)
HDL: 38 mg/dL — ABNORMAL LOW (ref >39)
LDL Chol Calc (NIH): 124 mg/dL — ABNORMAL HIGH (ref 0–99)
LDL/HDL Ratio: 3.3 ratio (ref 0.0–3.6)
Triglycerides: 118 mg/dL (ref 0–149)
VLDL Cholesterol Cal: 21 mg/dL (ref 5–40)

## 2021-03-17 LAB — CBC WITH DIFFERENTIAL/PLATELET
Basophils Absolute: 0.1 10*3/uL (ref 0.0–0.2)
Basos: 1 %
EOS (ABSOLUTE): 0.3 10*3/uL (ref 0.0–0.4)
Eos: 5 %
Hematocrit: 45.9 % (ref 37.5–51.0)
Hemoglobin: 15.5 g/dL (ref 13.0–17.7)
Immature Grans (Abs): 0 10*3/uL (ref 0.0–0.1)
Immature Granulocytes: 0 %
Lymphocytes Absolute: 2 10*3/uL (ref 0.7–3.1)
Lymphs: 35 %
MCH: 30.5 pg (ref 26.6–33.0)
MCHC: 33.8 g/dL (ref 31.5–35.7)
MCV: 90 fL (ref 79–97)
Monocytes Absolute: 0.6 10*3/uL (ref 0.1–0.9)
Monocytes: 10 %
Neutrophils Absolute: 3 10*3/uL (ref 1.4–7.0)
Neutrophils: 49 %
Platelets: 206 10*3/uL (ref 150–450)
RBC: 5.09 x10E6/uL (ref 4.14–5.80)
RDW: 14.9 % (ref 11.6–15.4)
WBC: 5.9 10*3/uL (ref 3.4–10.8)

## 2021-03-17 LAB — TSH: TSH: 3.67 u[IU]/mL (ref 0.450–4.500)

## 2021-03-17 LAB — HEPATITIS C ANTIBODY: Hep C Virus Ab: <0.1 s/co ratio (ref 0.0–0.9)

## 2021-03-23 ENCOUNTER — Other Ambulatory Visit: Payer: Self-pay

## 2021-03-23 ENCOUNTER — Encounter: Payer: Self-pay | Admitting: Nurse Practitioner

## 2021-03-23 ENCOUNTER — Telehealth: Payer: Self-pay | Admitting: Pulmonary Disease

## 2021-03-23 ENCOUNTER — Ambulatory Visit (HOSPITAL_COMMUNITY)
Admission: RE | Admit: 2021-03-23 | Discharge: 2021-03-23 | Disposition: A | Discharge status: Home / Self Care | Payer: 59 | Source: Ambulatory Visit | Attending: Nurse Practitioner | Admitting: Nurse Practitioner

## 2021-03-23 ENCOUNTER — Ambulatory Visit (INDEPENDENT_AMBULATORY_CARE_PROVIDER_SITE_OTHER): Payer: 59 | Admitting: Nurse Practitioner

## 2021-03-23 VITALS — BP 116/59 | HR 102 | Temp 98.2°F | Ht 73.0 in | Wt 227.0 lb

## 2021-03-23 DIAGNOSIS — E291 Testicular hypofunction: Secondary | ICD-10-CM | POA: Diagnosis not present

## 2021-03-23 DIAGNOSIS — E785 Hyperlipidemia, unspecified: Secondary | ICD-10-CM

## 2021-03-23 DIAGNOSIS — R0981 Nasal congestion: Secondary | ICD-10-CM | POA: Diagnosis not present

## 2021-03-23 DIAGNOSIS — R0781 Pleurodynia: Secondary | ICD-10-CM | POA: Diagnosis not present

## 2021-03-23 MED ORDER — SYRINGE 23G X 1" 3 ML MISC
1.0000 | 1 refills | Status: AC
Start: 2021-03-23 — End: ?

## 2021-03-23 MED ORDER — ROSUVASTATIN CALCIUM 20 MG PO TABS: 20.0000 mg | ORAL_TABLET | Freq: Every day | ORAL | 3 refills | Status: AC

## 2021-03-23 MED ORDER — TESTOSTERONE CYPIONATE 200 MG/ML IM SOLN: 100.0000 mg | INTRAMUSCULAR | 1 refills | Status: AC

## 2021-03-23 MED ORDER — CETIRIZINE HCL 10 MG PO TABS: 10.0000 mg | ORAL_TABLET | Freq: Every day | ORAL | 11 refills | Status: AC

## 2021-03-23 NOTE — Assessment & Plan Note (Signed)
-  ongoing since JUne -will repeat imaging

## 2021-03-23 NOTE — Patient Instructions (Addendum)
Pulmonology is in our office. Please talk to their receptionist to set up an appointment to discuss your lung nodules.  For your next appointment with [place], please have fasting labs drawn 2-3 days prior to your appointment so we can discuss the results during your office visit.

## 2021-03-23 NOTE — Telephone Encounter (Signed)
LMTCB   Does patient want to be seen in [place]?

## 2021-03-23 NOTE — Progress Notes (Signed)
Established Patient Office Visit  Subjective:  Patient ID: James Blankenship, male    DOB: August 14, 1965  Age: 55 y.o. MRN: [id number]  CC:  Chief Complaint  Patient presents with   Follow-up    Labs     HPI James Blankenship presents for lab follow-up for HLD and testosterone check.  He states he still has left lower rib pain from coughing in June. X-rays at that time were negative.   Past Medical History:  Diagnosis Date   AP (abdominal pain)    Chest pain    Chest pressure    Chronic intermittent hypoxia with obstructive sleep apnea    Chronic nasal congestion    Circadian rhythm sleep disorder    Diaphoresis    Dysphagia    Gastritis    GERD (gastroesophageal reflux disease)    GERD (gastroesophageal reflux disease)    Hand tingling    Male hypogonadism 03/23/2019   Overbite    Sleep apnea    Vitamin D deficiency disease 03/23/2019    Past Surgical History:  Procedure Laterality Date   BIOPSY  11/01/2015   Procedure: BIOPSY;  Surgeon: Danie Binder, MD;  [facility]: [facility];  Service: Endoscopy;;  gastric   CHOLECYSTECTOMY     COLONOSCOPY WITH PROPOFOL N/A 11/01/2015   Dr. Oneida Alar: internal hemorrhoids, otherwise normal.    ESOPHAGOGASTRODUODENOSCOPY  06/10/2009   Distal esophagea stricture dialted to 24m. Bx from distal esophagus (neg). erythema in body and antrum.   ESOPHAGOGASTRODUODENOSCOPY  05/30/2009   distal peptic stricture dialted to 18m diffuse bx proven gastritis, no .hpylori   ESOPHAGOGASTRODUODENOSCOPY (EGD) WITH PROPOFOL N/A 11/01/2015   Dr. FiOneida Alarbenign-appearing esophageal stenosis s/p dilation, chronic gastritis    HERNIA REPAIR Left    groin   SAVORY DILATION N/A 11/01/2015   Procedure: SAVORY DILATION;  Surgeon: SaDanie BinderMD;  Location: [facility];  Service: Endoscopy;  Laterality: N/A;   VASECTOMY      Family History  Problem Relation Age of Onset   Cancer Father    Cancer - Other Father        mesothelioma, deceased at age 56   Colon cancer Neg Hx     Social History   Socioeconomic History   Marital status: Married    Spouse name: Not on file   Number of children: 2   Years of education: Not on file   Highest education level: Not on file  Occupational History    Employer: [employer]   Occupation: [employer]    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  Tobacco Use   Smoking status: Never   Smokeless tobacco: Never  Vaping Use   Vaping Use: Never used  Substance and Sexual Activity   Alcohol use: No    Alcohol/week: 0.0 standard drinks   Drug use: No   Sexual activity: Yes    Birth control/protection: Surgical  Other Topics Concern   Not on file  Social History Narrative   Married since 2019,lives with wife and extended family.Works at [employer]  Social Determinants of Health   Financial Resource Strain: Not on file  Food Insecurity: Not on file  Transportation Needs: Not on file  Physical Activity: Not on file  Stress: Not on file  Social Connections: Not on file  Intimate Partner Violence: Not on file    Outpatient Medications Prior to Visit  Medication Sig Dispense Refill   Cholecalciferol (VITAMIN D-3)  125 MCG (5000 UT) TABS Take 10,000 Units by mouth daily at 12 noon.     fluticasone (FLONASE) 50 MCG/ACT nasal spray Place 1 spray into both nostrils daily as needed for allergies or rhinitis. 16 g 0   ibuprofen (ADVIL) 600 MG tablet Take 600 mg by mouth in the morning and at bedtime. ALTERNATE WITH CYCLOBENZAPRINE 10 MG.     meclizine (ANTIVERT) 25 MG tablet Take 1 tablet (25 mg total) by mouth 3 (three) times daily as needed for dizziness. 30 tablet 0   pantoprazole (PROTONIX) 40 MG tablet TAKE 1 TABLET BY MOUTH EVERY MORNING 30 MINUTES BEFORE BREAKFAST 90 tablet 1   valACYclovir (VALTREX) 1000 MG tablet Take 1,000 mg by mouth daily.      loratadine (CLARITIN) 10 MG tablet Take 10 mg by mouth daily.     testosterone cypionate  (DEPOTESTOSTERONE CYPIONATE) 200 MG/ML injection INJECT 0.5 MLS INTO THE MUSCLE TWICE WEEKLY (Patient not taking: Reported on 03/23/2021) 10 mL 1   No facility-administered medications prior to visit.      ROS Review of Systems  Constitutional: Negative.   Respiratory: Negative.    Cardiovascular: Negative.   Musculoskeletal:        Left lower rib pain  Psychiatric/Behavioral: Negative.       Objective:    Physical Exam Constitutional:      Appearance: Normal appearance.  Cardiovascular:     Rate and Rhythm: Normal rate and regular rhythm.     Pulses: Normal pulses.     Heart sounds: Normal heart sounds.  Pulmonary:     Effort: Pulmonary effort is normal.     Breath sounds: Normal breath sounds.  Musculoskeletal:        General: Normal range of motion.  Neurological:     Mental Status: He is alert.  Psychiatric:        Mood and Affect: Mood normal.        Behavior: Behavior normal.        Thought Content: Thought content normal.        Judgment: Judgment normal.    BP (!) 116/59 (BP Location: Left Arm, Patient Position: Sitting, Cuff Size: Large)   Pulse (!) 102   Temp 98.2 F (36.8 C) (Oral)   Ht _0  (1.854 m)   Wt 227 lb (103 kg)   SpO2 96%   BMI 29.95 kg/m  Wt Readings from Last 3 Encounters:  03/23/21 227 lb (103 kg)  03/01/21 238 lb 0.6 oz (108 kg)  12/19/20 226 lb 12.8 oz (102.9 kg)     Health Maintenance Due  Topic Date Due   Pneumococcal Vaccine 40-24 Years old (1 - PCV) Never done   Zoster Vaccines- Shingrix (1 of 2) Never done    There are no preventive care reminders to display for this patient.  Lab Results  Component Value Date   TSH 3.670 03/14/2021   Lab Results  Component Value Date   WBC 5.9 03/14/2021   HGB 15.5 03/14/2021   HCT 45.9 03/14/2021   MCV 90 03/14/2021   PLT 206 03/14/2021   Lab Results  Component Value Date   NA 138 03/14/2021   K 4.5 03/14/2021   CO2 25 03/14/2021   GLUCOSE 88 03/14/2021   BUN 20  03/14/2021   CREATININE 1.38 (H) 03/14/2021   BILITOT 0.6 03/14/2021   ALKPHOS 64 03/14/2021   AST 28 03/14/2021   ALT 45 (H) 03/14/2021   PROT 6.9 03/14/2021   ALBUMIN  4.7 03/14/2021   CALCIUM 9.3 03/14/2021   ANIONGAP 8 12/18/2020   EGFR 60 03/14/2021   Lab Results  Component Value Date   CHOL 183 03/14/2021   Lab Results  Component Value Date   HDL 38 (L) 03/14/2021   Lab Results  Component Value Date   LDLCALC 124 (H) 03/14/2021   Lab Results  Component Value Date   TRIG 118 03/14/2021   Lab Results  Component Value Date   CHOLHDL 4.7 03/23/2019   Lab Results  Component Value Date   HGBA1C 5.6 06/30/2019      Assessment & Plan:   Problem List Items Addressed This Visit       Endocrine   Male hypogonadism - Primary   Relevant Medications   testosterone cypionate (DEPOTESTOSTERONE CYPIONATE) 200 MG/ML injection   Syringe/Needle, Disp, (SYRINGE 3CC/23GX1") 23G X 1" 3 ML MISC   Other Relevant Orders   CBC with Differential/Platelet   CMP14+EGFR   Testosterone, Free, Total, SHBG     Other   Chronic nasal congestion    -has been taking OTC claritin for about a year -STOP claritin -Rx. Zyrtec; can cycle off in 6-12 months, whenever it becomes less effective      Relevant Medications   cetirizine (ZYRTEC) 10 MG tablet   Hyperlipidemia   Relevant Medications   rosuvastatin (CRESTOR) 20 MG tablet   Other Relevant Orders   CBC with Differential/Platelet   CMP14+EGFR   Rib pain on left side    -ongoing since JUne -will repeat imaging      Relevant Orders   DG Ribs Unilateral Left    Meds ordered this encounter  Medications   testosterone cypionate (DEPOTESTOSTERONE CYPIONATE) 200 MG/ML injection    Sig: Inject 0.5 mLs (100 mg total) into the muscle every 14 (fourteen) days.    Dispense:  3 mL    Refill:  1   Syringe/Needle, Disp, (SYRINGE 3CC/23GX1") 23G X 1" 3 ML MISC    Sig: 1 each by Does not apply route every 14 (fourteen) days.     Dispense:  50 each    Refill:  1   rosuvastatin (CRESTOR) 20 MG tablet    Sig: Take 1 tablet (20 mg total) by mouth daily.    Dispense:  90 tablet    Refill:  3   cetirizine (ZYRTEC) 10 MG tablet    Sig: Take 1 tablet (10 mg total) by mouth daily.    Dispense:  30 tablet    Refill:  11    Follow-up: Return in about 2 months (around 05/23/2021) for Physical Exam.    Noreene Larsson, NP

## 2021-03-23 NOTE — Assessment & Plan Note (Signed)
-  has been taking OTC claritin for about a year -STOP claritin -Rx. Zyrtec; can cycle off in 6-12 months, whenever it becomes less effective

## 2021-03-27 NOTE — Progress Notes (Signed)
James Blankenship was negative... no fracture

## 2021-03-30 ENCOUNTER — Ambulatory Visit: Payer: 59 | Admitting: Urology

## 2021-03-30 ENCOUNTER — Encounter: Payer: Self-pay | Admitting: Urology

## 2021-03-30 ENCOUNTER — Ambulatory Visit (INDEPENDENT_AMBULATORY_CARE_PROVIDER_SITE_OTHER): Payer: 59 | Admitting: Urology

## 2021-03-30 ENCOUNTER — Other Ambulatory Visit: Payer: Self-pay

## 2021-03-30 VITALS — BP 144/78 | HR 80

## 2021-03-30 DIAGNOSIS — R0781 Pleurodynia: Secondary | ICD-10-CM

## 2021-03-30 DIAGNOSIS — N281 Cyst of kidney, acquired: Secondary | ICD-10-CM

## 2021-03-30 DIAGNOSIS — R109 Unspecified abdominal pain: Secondary | ICD-10-CM

## 2021-03-30 DIAGNOSIS — N529 Male erectile dysfunction, unspecified: Secondary | ICD-10-CM

## 2021-03-30 MED ORDER — SILDENAFIL CITRATE 20 MG PO TABS: ORAL_TABLET | ORAL | 11 refills | Status: DC

## 2021-03-30 NOTE — Progress Notes (Signed)
Urological Symptom Review  Patient is experiencing the following symptoms: Frequent urination Get up at night to urinate   Review of Systems  Gastrointestinal (upper)  : Indigestion/heartburn  Gastrointestinal (lower) : Negative for lower GI symptoms  Constitutional : Fatigue  Skin: Negative for skin symptoms  Eyes: Negative for eye symptoms  Ear/Nose/Throat : Negative for Ear/Nose/Throat symptoms  Hematologic/Lymphatic: Negative for Hematologic/Lymphatic symptoms  Cardiovascular : Negative for cardiovascular symptoms  Respiratory : Shortness of breath  Endocrine: Negative for endocrine symptoms  Musculoskeletal: Negative for musculoskeletal symptoms  Neurological: Negative for neurological symptoms  Psychologic: Negative for psychiatric symptoms

## 2021-03-30 NOTE — Progress Notes (Signed)
Assessment: 1. Bilateral renal cysts   2. Organic impotence   3. Rib pain on left side      Plan: I personally reviewed the patient's imaging studies including the CT scan from 6/22 and the ultrasound from 8/22.  The studies do show evidence of small bilateral renal cysts consistent with benign renal cyst.  No evidence of nephrolithiasis was noted on the CT scan from 6/22.  Since that CT imaging is much more sensitive for detecting stones, I do not think that the ultrasound findings suggesting an echogenic focus in the left kidney are consistent with a renal calculus.  I discussed the natural history of kidney stones and that it typically takes a number of months for stones to form.  Furthermore, his symptoms of left-sided chest pain are more consistent with musculoskeletal causes as opposed to renal colic. Today I had a long discussion with the patient spending a total of 15 minutes discussing ED.  I discussed the pathophysiology, etiology, and natural history of ED as well as management options using a goal-oriented approach.  We discussed the following options: Medical therapy, vacuum erection device, penile injections, intraurethral suppository therapy (MUSE), and penile prosthesis. Patient educational materials concerning these options were given to the patient.  Trial of Viagra; no contraindications. Rx for sildenafil 20 mg tabs 2-5 tabs prior to intercourse provided Return to office in 3 months   I personally spent 45 minutes involved in face-to-face and non-face-to-face activities for this patient on the day of the visit.  Professional time spent included the following activities, in addition to those noted in the documentation: Review of prior imaging studies including ultrasound and CT studies, review of notes from referring physician, review of prior laboratory results, discussion of findings, and discussion of management options for erectile dysfunction.  Chief Complaint:  Chief  Complaint  Patient presents with   Nephrolithiasis     History of Present Illness:  James Blankenship is a 55 y.o. year old male who is seen in consultation from Noreene Larsson, NP  for evaluation of renal cyst, possible renal calculus, and erectile dysfunction.  He has a several month history of left upper abdominal/lower chest pain.  He was evaluated with a CT scan without contrast on 12/18/2020.  This study showed mild bilateral perinephric stranding, no evidence of obstruction, and no renal or ureteral calculi.  He was recently evaluated with a renal ultrasound on 02/21/2021 which showed small bilateral simple renal cyst and a 11 mm shadowing echogenic focus in the lower pole of the left kidney without evidence of hydronephrosis.  No prior history of kidney stones.  He does report some discomfort in the left lower chest area associated with coughing, sneezing, and movements.  He has not having any flank pain.  He reports some urinary frequency and nocturia 2-4 times per night.  No dysuria or gross hematuria.  He also reports a history of erectile dysfunction.  He has difficulty achieving and maintaining his erections.  No pain or curvature.  No decrease in his libido.  He has not tried any medical therapy for erectile dysfunction to date.  He does not take nitrates.  He is currently on testosterone replacement therapy receiving testosterone injections 100 mg every 2 weeks.  This has been managed by his PCP.  PSA from 5/22: 0.9  Past Medical History:  Past Medical History:  Diagnosis Date   AP (abdominal pain)    Chest pain    Chest pressure  Chronic intermittent hypoxia with obstructive sleep apnea    Chronic nasal congestion    Circadian rhythm sleep disorder    Diaphoresis    Dysphagia    Gastritis    GERD (gastroesophageal reflux disease)    GERD (gastroesophageal reflux disease)    Hand tingling    Male hypogonadism 03/23/2019   Overbite    Sleep apnea    Vitamin D deficiency  disease 03/23/2019    Past Surgical History:  Past Surgical History:  Procedure Laterality Date   BIOPSY  11/01/2015   Procedure: BIOPSY;  Surgeon: Danie Binder, MD;  [facility]: [facility];  Service: Endoscopy;;  gastric   CHOLECYSTECTOMY     COLONOSCOPY WITH PROPOFOL N/A 11/01/2015   Dr. Oneida Alar: internal hemorrhoids, otherwise normal.    ESOPHAGOGASTRODUODENOSCOPY  06/10/2009   Distal esophagea stricture dialted to 66mm. Bx from distal esophagus (neg). erythema in body and antrum.   ESOPHAGOGASTRODUODENOSCOPY  05/30/2009   distal peptic stricture dialted to 7mm, diffuse bx proven gastritis, no .hpylori   ESOPHAGOGASTRODUODENOSCOPY (EGD) WITH PROPOFOL N/A 11/01/2015   Dr. Oneida Alar: benign-appearing esophageal stenosis s/p dilation, chronic gastritis    HERNIA REPAIR Left    groin   SAVORY DILATION N/A 11/01/2015   Procedure: SAVORY DILATION;  Surgeon: Danie Binder, MD;  Location: [facility];  Service: Endoscopy;  Laterality: N/A;   VASECTOMY      Allergies:  Allergies  Allergen Reactions   Penicillins Swelling    Has patient had a PCN reaction causing immediate rash, facial/tongue/throat swelling, SOB or lightheadedness with hypotension: Yes Has patient had a PCN reaction causing severe rash involving mucus membranes or skin necrosis: No Has patient had a PCN reaction that required hospitalization No Has patient had a PCN reaction occurring within the last 10 years: No If all of the above answers are "NO", then may proceed with Cephalosporin    Family History:  Family History  Problem Relation Age of Onset   Cancer Father    Cancer - Other Father        mesothelioma, deceased at age 90    Colon cancer Neg Hx     Social History:  Social History   Tobacco Use   Smoking status: Never   Smokeless tobacco: Never  Vaping Use   Vaping Use: Never used  Substance Use Topics   Alcohol use: No    Alcohol/week: 0.0 standard drinks   Drug use: No    Review of symptoms:   Constitutional:  Negative for unexplained weight loss, night sweats, fever, chills ENT:  Negative for nose bleeds, sinus pain, painful swallowing CV:  Negative for chest pain, shortness of breath, exercise intolerance, palpitations, loss of consciousness Resp:  Negative for cough, wheezing, shortness of breath GI:  Negative for nausea, vomiting, diarrhea, bloody stools GU:  Positives noted in HPI; otherwise negative for gross hematuria, dysuria, urinary incontinence Neuro:  Negative for seizures, poor balance, limb weakness, slurred speech Psych:  Negative for lack of energy, depression, anxiety Endocrine:  Negative for polydipsia, polyuria, symptoms of hypoglycemia (dizziness, hunger, sweating) Hematologic:  Negative for anemia, purpura, petechia, prolonged or excessive bleeding, use of anticoagulants  Allergic:  Negative for difficulty breathing or choking as a result of exposure to anything; no shellfish allergy; no allergic response (rash/itch) to materials, foods  Physical exam: BP (!) 144/78   Pulse 80  GENERAL APPEARANCE:  Well appearing, well developed, well nourished, NAD HEENT: Atraumatic, Normocephalic, oropharynx clear. NECK: Supple without lymphadenopathy or thyromegaly.  LUNGS: Clear to auscultation bilaterally. HEART: Regular Rate and Rhythm without murmurs, gallops, or rubs. ABDOMEN: Soft, non-tender, No Masses. EXTREMITIES: Moves all extremities well.  Without clubbing, cyanosis, or edema. NEUROLOGIC:  Alert and oriented x 3, normal gait, CN II-XII grossly intact.  MENTAL STATUS:  Appropriate. BACK:  Non-tender to palpation.  No CVAT SKIN:  Warm, dry and intact.    Results: Urinalysis (dipstick): Negative

## 2021-03-31 DIAGNOSIS — N281 Cyst of kidney, acquired: Secondary | ICD-10-CM | POA: Insufficient documentation

## 2021-04-20 NOTE — Telephone Encounter (Signed)
ATC patient. VM is full and unable to [facility],

## 2021-04-21 NOTE — Telephone Encounter (Signed)
ATC patient. VM is full  

## 2021-04-21 NOTE — Telephone Encounter (Addendum)
Patient called back and was scheduled by Kenney Houseman at the front desk for an appt in . Nothing further needed at this time.

## 2021-05-05 ENCOUNTER — Telehealth: Payer: Self-pay

## 2021-05-05 ENCOUNTER — Other Ambulatory Visit: Payer: Self-pay

## 2021-05-05 MED ORDER — PANTOPRAZOLE SODIUM 40 MG PO TBEC: DELAYED_RELEASE_TABLET | ORAL | 0 refills | Status: DC

## 2021-05-05 NOTE — Telephone Encounter (Signed)
FMLA  (must be done every 6 months) Sleep Adnea / Vertigo  Fax FMLA to [employer] [phone] when forms are completed.  Noted Copied Sleeved

## 2021-05-15 ENCOUNTER — Telehealth: Payer: Self-pay | Admitting: Nurse Practitioner

## 2021-05-15 NOTE — Telephone Encounter (Signed)
Pt called in this morning about FMLA paperwork. Had to Miss work on Saturday 11/5 and pt needs to have that date added to his FMLA  paper work

## 2021-05-15 NOTE — Telephone Encounter (Signed)
PT IS AWARE

## 2021-05-16 ENCOUNTER — Ambulatory Visit (INDEPENDENT_AMBULATORY_CARE_PROVIDER_SITE_OTHER): Payer: 59 | Admitting: Internal Medicine

## 2021-05-16 DIAGNOSIS — Z0279 Encounter for issue of other medical certificate: Secondary | ICD-10-CM

## 2021-05-16 NOTE — Telephone Encounter (Signed)
Faxed forms to [employer] and patient will pick up forms.

## 2021-06-13 ENCOUNTER — Telehealth: Payer: Self-pay

## 2021-06-13 ENCOUNTER — Encounter: Payer: 59 | Admitting: Nurse Practitioner

## 2021-06-13 NOTE — Telephone Encounter (Signed)
Patient need med refill pantoprazole (PROTONIX) 40 MG tablet  [facility] [address] [place]

## 2021-06-14 ENCOUNTER — Other Ambulatory Visit: Payer: Self-pay

## 2021-06-14 MED ORDER — PANTOPRAZOLE SODIUM 40 MG PO TBEC
DELAYED_RELEASE_TABLET | ORAL | 0 refills | Status: DC
Start: 2021-06-14 — End: 2021-08-25

## 2021-06-14 NOTE — Telephone Encounter (Signed)
Refill sent to pharmacy.   

## 2021-06-20 ENCOUNTER — Encounter: Payer: 59 | Admitting: Nurse Practitioner

## 2021-06-28 ENCOUNTER — Telehealth: Payer: Self-pay

## 2021-06-28 NOTE — Telephone Encounter (Signed)
Fine with me

## 2021-06-28 NOTE — Telephone Encounter (Signed)
Upon reviewing pts charts for Friday morning OV, looks like it was never confirmed between Dr. Ander Slade and Dr. Halford Chessman if switch was okay.   Patient wants to be seen in [facility] office for convenience.   Dr. Halford Chessman and Dr. Ander Slade please confirm if this is okay.   Sorry about that!

## 2021-06-29 ENCOUNTER — Ambulatory Visit (INDEPENDENT_AMBULATORY_CARE_PROVIDER_SITE_OTHER): Payer: 59 | Admitting: Nurse Practitioner

## 2021-06-29 ENCOUNTER — Other Ambulatory Visit: Payer: Self-pay

## 2021-06-29 ENCOUNTER — Encounter: Payer: Self-pay | Admitting: Nurse Practitioner

## 2021-06-29 VITALS — BP 160/92 | HR 87 | Ht 73.0 in | Wt 242.0 lb

## 2021-06-29 DIAGNOSIS — R918 Other nonspecific abnormal finding of lung field: Secondary | ICD-10-CM

## 2021-06-29 DIAGNOSIS — E559 Vitamin D deficiency, unspecified: Secondary | ICD-10-CM | POA: Diagnosis not present

## 2021-06-29 DIAGNOSIS — I10 Essential (primary) hypertension: Secondary | ICD-10-CM

## 2021-06-29 DIAGNOSIS — Z23 Encounter for immunization: Secondary | ICD-10-CM | POA: Diagnosis not present

## 2021-06-29 DIAGNOSIS — E785 Hyperlipidemia, unspecified: Secondary | ICD-10-CM

## 2021-06-29 DIAGNOSIS — Z0001 Encounter for general adult medical examination with abnormal findings: Secondary | ICD-10-CM | POA: Diagnosis not present

## 2021-06-29 DIAGNOSIS — K429 Umbilical hernia without obstruction or gangrene: Secondary | ICD-10-CM | POA: Insufficient documentation

## 2021-06-29 DIAGNOSIS — E291 Testicular hypofunction: Secondary | ICD-10-CM | POA: Diagnosis not present

## 2021-06-29 DIAGNOSIS — R0781 Pleurodynia: Secondary | ICD-10-CM

## 2021-06-29 MED ORDER — OLMESARTAN MEDOXOMIL 20 MG PO TABS: 20.0000 mg | ORAL_TABLET | Freq: Every day | ORAL | 3 refills | Status: AC

## 2021-06-29 NOTE — Assessment & Plan Note (Signed)
-  CT abd and chest were both negative -he is still coughing, so may have muscle strain -considered ACEi or ARB as causative agent, but he states his cough started before losartan

## 2021-06-29 NOTE — Assessment & Plan Note (Signed)
-  has small umbilical hernia -when he moves/flexes abd muscles he sees a bulge above his umbilicus -he is concerned with left rib/abdominal pain -we discussed that his umbilical hernia is very small, and that that diastasis recti is a benign condition, but he would like a second opinion, so referral to Dr. Constance Haw

## 2021-06-29 NOTE — Assessment & Plan Note (Signed)
-  has been taking testosterone 100 mg q14d -check labs

## 2021-06-29 NOTE — Progress Notes (Signed)
Acute Office Visit  Subjective:    Patient ID: James Blankenship, male    DOB: [birth year], 55 y.o.   MRN: [id number]  Chief Complaint  Patient presents with   Annual Exam    CPE    HPI Patient is in today for physical exam and lab follow-up.  He has chronic left rib pain. He has had imaging that was negative, but states he still has a cough and rib pain.  He has hx of umbilical hernia and is concerned that it is getting bigger.  Past Medical History:  Diagnosis Date   AP (abdominal pain)    Chest pain    Chest pressure    Chronic intermittent hypoxia with obstructive sleep apnea    Chronic nasal congestion    Circadian rhythm sleep disorder    Diaphoresis    Dysphagia    Gastritis    GERD (gastroesophageal reflux disease)    GERD (gastroesophageal reflux disease)    Hand tingling    Male hypogonadism 03/23/2019   Overbite    Sleep apnea    Vitamin D deficiency disease 03/23/2019    Past Surgical History:  Procedure Laterality Date   BIOPSY  11/01/2015   Procedure: BIOPSY;  Surgeon: Danie Binder, MD;  Location: [facility];  Service: Endoscopy;;  gastric   CHOLECYSTECTOMY     COLONOSCOPY WITH PROPOFOL N/A 11/01/2015   Dr. Oneida Alar: internal hemorrhoids, otherwise normal.    ESOPHAGOGASTRODUODENOSCOPY  06/10/2009   Distal esophagea stricture dialted to 87m. Bx from distal esophagus (neg). erythema in body and antrum.   ESOPHAGOGASTRODUODENOSCOPY  05/30/2009   distal peptic stricture dialted to 159m diffuse bx proven gastritis, no .hpylori   ESOPHAGOGASTRODUODENOSCOPY (EGD) WITH PROPOFOL N/A 11/01/2015   Dr. FiOneida Alarbenign-appearing esophageal stenosis s/p dilation, chronic gastritis    HERNIA REPAIR Left    groin   SAVORY DILATION N/A 11/01/2015   Procedure: SAVORY DILATION;  Surgeon: SaDanie BinderMD;  Location: [facility];  Service: Endoscopy;  Laterality: N/A;   VASECTOMY      Family History  Problem Relation Age of Onset   Cancer Father    Cancer -  Other Father        mesothelioma, deceased at age 56  Colon cancer Neg Hx     Social History   Socioeconomic History   Marital status: Married    Spouse name: Not on file   Number of children: 2   Years of education: Not on file   Highest education level: Not on file  Occupational History    Employer: [employer]   Occupation: [employer]    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  Tobacco Use   Smoking status: Never   Smokeless tobacco: Never  Vaping Use   Vaping Use: Never used  Substance and Sexual Activity   Alcohol use: No    Alcohol/week: 0.0 standard drinks   Drug use: No   Sexual activity: Yes    Birth control/protection: Surgical  Other Topics Concern   Not on file  Social History Narrative   Married since 2019,lives with wife and extended family.Works at [employer]  Social Determinants of Health   Financial Resource Strain: Not on file  Food Insecurity: Not on file  Transportation Needs: Not on file  Physical Activity: Not on file  Stress: Not on file  Social Connections: Not on file  Intimate Partner Violence: Not on file  Outpatient Medications Prior to Visit  Medication Sig Dispense Refill   cetirizine (ZYRTEC) 10 MG tablet Take 1 tablet (10 mg total) by mouth daily. 30 tablet 11   chlorthalidone (HYGROTON) 25 MG tablet Take 12.5 mg by mouth every morning.     Cholecalciferol (VITAMIN D-3) 125 MCG (5000 UT) TABS Take 10,000 Units by mouth daily at 12 noon.     meclizine (ANTIVERT) 25 MG tablet Take 1 tablet (25 mg total) by mouth 3 (three) times daily as needed for dizziness. 30 tablet 0   pantoprazole (PROTONIX) 40 MG tablet TAKE 1 TABLET BY MOUTH EVERY MORNING 30 MINUTES BEFORE BREAKFAST 90 tablet 0   rosuvastatin (CRESTOR) 20 MG tablet Take 1 tablet (20 mg total) by mouth daily. 90 tablet 3   sildenafil (REVATIO) 20 MG tablet Take 3-5 tablets by mouth 30-60 minutes prior to sexual activity 50 tablet  11   Syringe/Needle, Disp, (SYRINGE 3CC/23GX1") 23G X 1" 3 ML MISC 1 each by Does not apply route every 14 (fourteen) days. 50 each 1   testosterone cypionate (DEPOTESTOSTERONE CYPIONATE) 200 MG/ML injection Inject 0.5 mLs (100 mg total) into the muscle every 14 (fourteen) days. 3 mL 1   valACYclovir (VALTREX) 1000 MG tablet Take 1,000 mg by mouth daily.      losartan (COZAAR) 25 MG tablet Take 12.5 mg by mouth daily.     No facility-administered medications prior to visit.    Allergies  Allergen Reactions   Penicillins Swelling    Has patient had a PCN reaction causing immediate rash, facial/tongue/throat swelling, SOB or lightheadedness with hypotension: Yes Has patient had a PCN reaction causing severe rash involving mucus membranes or skin necrosis: No Has patient had a PCN reaction that required hospitalization No Has patient had a PCN reaction occurring within the last 10 years: No If all of the above answers are "NO", then may proceed with Cephalosporin    Review of Systems  Constitutional: Negative.   HENT: Negative.    Eyes: Negative.   Respiratory:  Positive for cough.   Cardiovascular: Negative.   Gastrointestinal:  Positive for abdominal pain.       Left rib/near umbilicus  Endocrine: Negative.   Genitourinary: Negative.   Musculoskeletal: Negative.   Skin: Negative.   Allergic/Immunologic: Negative.   Neurological: Negative.   Hematological: Negative.   Psychiatric/Behavioral: Negative.        Objective:    Physical Exam Constitutional:      Appearance: Normal appearance. He is obese.  HENT:     Head: Normocephalic and atraumatic.     Right Ear: Tympanic membrane, ear canal and external ear normal.     Left Ear: Tympanic membrane, ear canal and external ear normal.     Nose: Nose normal.     Mouth/Throat:     Mouth: Mucous membranes are moist.     Pharynx: Oropharynx is clear.  Eyes:     Extraocular Movements: Extraocular movements intact.      Conjunctiva/sclera: Conjunctivae normal.     Pupils: Pupils are equal, round, and reactive to light.  Cardiovascular:     Rate and Rhythm: Normal rate and regular rhythm.     Pulses: Normal pulses.     Heart sounds: Normal heart sounds.  Pulmonary:     Effort: Pulmonary effort is normal.     Breath sounds: Normal breath sounds.  Abdominal:     Tenderness: There is no abdominal tenderness.     Comments: Small umbilical hernia; diastasis recti  Musculoskeletal:        General: Normal range of motion.     Cervical back: Normal range of motion and neck supple.  Skin:    General: Skin is warm and dry.     Capillary Refill: Capillary refill takes less than 2 seconds.  Neurological:     General: No focal deficit present.     Mental Status: He is alert and oriented to person, place, and time.     Cranial Nerves: No cranial nerve deficit.     Sensory: No sensory deficit.     Motor: No weakness.     Coordination: Coordination normal.     Gait: Gait normal.  Psychiatric:        Mood and Affect: Mood normal.        Behavior: Behavior normal.        Thought Content: Thought content normal.        Judgment: Judgment normal.    BP (!) 160/92    Pulse 87    Ht 6' 1"  (1.854 m)    Wt 242 lb 0.6 oz (109.8 kg)    SpO2 95%    BMI 31.93 kg/m  Wt Readings from Last 3 Encounters:  06/29/21 242 lb 0.6 oz (109.8 kg)  03/23/21 227 lb (103 kg)  03/01/21 238 lb 0.6 oz (108 kg)    Health Maintenance Due  Topic Date Due   COVID-19 Vaccine (1) Never done   Pneumococcal Vaccine 26-19 Years old (1 - PCV) Never done   Zoster Vaccines- Shingrix (1 of 2) Never done    There are no preventive care reminders to display for this patient.   Lab Results  Component Value Date   TSH 3.670 03/14/2021   Lab Results  Component Value Date   WBC 5.9 03/14/2021   HGB 15.5 03/14/2021   HCT 45.9 03/14/2021   MCV 90 03/14/2021   PLT 206 03/14/2021   Lab Results  Component Value Date   NA 138 03/14/2021    K 4.5 03/14/2021   CO2 25 03/14/2021   GLUCOSE 88 03/14/2021   BUN 20 03/14/2021   CREATININE 1.38 (H) 03/14/2021   BILITOT 0.6 03/14/2021   ALKPHOS 64 03/14/2021   AST 28 03/14/2021   ALT 45 (H) 03/14/2021   PROT 6.9 03/14/2021   ALBUMIN 4.7 03/14/2021   CALCIUM 9.3 03/14/2021   ANIONGAP 8 12/18/2020   EGFR 60 03/14/2021   Lab Results  Component Value Date   CHOL 183 03/14/2021   Lab Results  Component Value Date   HDL 38 (L) 03/14/2021   Lab Results  Component Value Date   LDLCALC 124 (H) 03/14/2021   Lab Results  Component Value Date   TRIG 118 03/14/2021   Lab Results  Component Value Date   CHOLHDL 4.7 03/23/2019   Lab Results  Component Value Date   HGBA1C 5.6 06/30/2019       Assessment & Plan:   Problem List Items Addressed This Visit       Cardiovascular and Mediastinum   Essential hypertension    BP Readings from Last 3 Encounters:  06/29/21 (!) 160/92  03/30/21 (!) 144/78  03/23/21 (!) 116/59  -BP elevated today Takes low dose losartan and chlorthalidone -STOP losartan -Rx. olmesartan      Relevant Medications   chlorthalidone (HYGROTON) 25 MG tablet   olmesartan (BENICAR) 20 MG tablet   Other Relevant Orders   CMP14+EGFR   CBC with Differential/Platelet   Lipid Panel With  LDL/HDL Ratio     Endocrine   Male hypogonadism    -has been taking testosterone 100 mg q14d -check labs      Relevant Orders   Testosterone, Free, Total, SHBG     Other   Encounter for general adult medical examination with abnormal findings - Primary    -exam completed -flu shot today      Relevant Orders   CMP14+EGFR   CBC with Differential/Platelet   Lipid Panel With LDL/HDL Ratio   Testosterone, Free, Total, SHBG   VITAMIN D 25 Hydroxy (Vit-D Deficiency, Fractures)   Vitamin D deficiency disease    -taking 1000 IU of vit D daily, but states he doesn't feels as well as he did when he was taking 10000 IU daily -check Vit D level       Relevant Orders   VITAMIN D 25 Hydroxy (Vit-D Deficiency, Fractures)   Hyperlipidemia    -check lipid panel      Relevant Medications   chlorthalidone (HYGROTON) 25 MG tablet   olmesartan (BENICAR) 20 MG tablet   Other Relevant Orders   CMP14+EGFR   CBC with Differential/Platelet   Lipid Panel With LDL/HDL Ratio   Lung nodules    -was referred to pulmonology at last OV -has this in process; swapping from [facility] to Dr. Halford Chessman -has nagging cough and nodules on  -considered repeat CT, but she has appt with Dr. Halford Chessman tomorrow, so will defer imaging to pulmonology -reviewed last CT 02/23/20  Persistent ground-glass nodules in the right upper lobe measuring up to 6 mm, unchanged in size since the prior study. Repeat CT is recommended every 2 years until 5 years of stability has been established. This recommendation follows the consensus statement: Guidelines for Management of Incidental Pulmonary Nodules Detected on CT Images: From the Fleischner Society 2017; Radiology 2017; 284:228-243. 2. Additional tiny pulmonary nodules in the right lung are also unchanged. Attention on follow-up imaging.      Rib pain on left side    -CT abd and chest were both negative -he is still coughing, so may have muscle strain -considered ACEi or ARB as causative agent, but he states his cough started before losartan      Umbilical hernia    -has small umbilical hernia -when he moves/flexes abd muscles he sees a bulge above his umbilicus -he is concerned with left rib/abdominal pain -we discussed that his umbilical hernia is very small, and that that diastasis recti is a benign condition, but he would like a second opinion, so referral to Dr. Constance Haw      Relevant Orders   Ambulatory referral to General Surgery     Meds ordered this encounter  Medications   olmesartan (BENICAR) 20 MG tablet    Sig: Take 1 tablet (20 mg total) by mouth daily.    Dispense:  90 tablet    Refill:  3    Stop  losartan     Noreene Larsson, NP

## 2021-06-29 NOTE — Assessment & Plan Note (Signed)
-  check lipid panel  

## 2021-06-29 NOTE — Assessment & Plan Note (Signed)
-  exam completed -flu shot today

## 2021-06-29 NOTE — Assessment & Plan Note (Signed)
-  taking 1000 IU of vit D daily, but states he doesn't feels as well as he did when he was taking 10000 IU daily -check Vit D level

## 2021-06-29 NOTE — Patient Instructions (Addendum)
Please have fasting labs drawn at your earliest convenience. ° °I will be moving to [facility] located at [address], [place], [place] [address] effective Jul 09, 2021. °If you would like to establish care with [facility] please call [phone]. °

## 2021-06-29 NOTE — Assessment & Plan Note (Signed)
-  was referred to pulmonology at last OV -has this in process; swapping from [facility] to Dr. Halford Chessman -has nagging cough and nodules on  -considered repeat CT, but she has appt with Dr. Halford Chessman tomorrow, so will defer imaging to pulmonology -reviewed last CT 02/23/20  Persistent ground-glass nodules in the right upper lobe measuring up to 6 mm, unchanged in size since the prior study. Repeat CT is recommended every 2 years until 5 years of stability has been established. This recommendation follows the consensus statement: Guidelines for Management of Incidental Pulmonary Nodules Detected on CT Images: From the Fleischner Society 2017; Radiology 2017; 284:228-243. 2. Additional tiny pulmonary nodules in the right lung are also unchanged. Attention on follow-up imaging.

## 2021-06-29 NOTE — Assessment & Plan Note (Signed)
BP Readings from Last 3 Encounters:  06/29/21 (!) 160/92  03/30/21 (!) 144/78  03/23/21 (!) 116/59   -BP elevated today Takes low dose losartan and chlorthalidone -STOP losartan -Rx. olmesartan

## 2021-06-30 ENCOUNTER — Other Ambulatory Visit: Payer: Self-pay

## 2021-06-30 ENCOUNTER — Encounter: Payer: Self-pay | Admitting: Pulmonary Disease

## 2021-06-30 ENCOUNTER — Ambulatory Visit: Payer: 59 | Admitting: Pulmonary Disease

## 2021-06-30 VITALS — BP 132/84 | HR 93 | Temp 98.4°F | Ht 73.0 in | Wt 242.0 lb

## 2021-06-30 DIAGNOSIS — G4733 Obstructive sleep apnea (adult) (pediatric): Secondary | ICD-10-CM | POA: Diagnosis not present

## 2021-06-30 DIAGNOSIS — R918 Other nonspecific abnormal finding of lung field: Secondary | ICD-10-CM | POA: Diagnosis not present

## 2021-06-30 NOTE — Progress Notes (Signed)
[facility]  Chief Complaint  Patient presents with   New Patient (Initial Visit)    Seen Dr. Ander Slade before. Sleep apnea. Bipap machine has not used  in a few months.   Wants to discuss a new mask.  Also has concerns about findings on a CXR reviewed by Dr. Ander Slade.     Past Surgical History:  He  has a past surgical history that includes Cholecystectomy; Vasectomy; Esophagogastroduodenoscopy (06/10/2009); Esophagogastroduodenoscopy (05/30/2009); Hernia repair (Left); Esophagogastroduodenoscopy (egd) with propofol (N/A, 11/01/2015); Colonoscopy with propofol (N/A, 11/01/2015); Savory dilation (N/A, 11/01/2015); and biopsy (11/01/2015).  Past Medical History:  GERD, Hypogonadism, Vit D deficiency, HTN, HLD, ED, COVID Pneumonia, CKD 3a, Deviated nasal septum, Secondary hyperparathyroidism, BPH. Nephrolithiasis  Constitutional:  BP 132/84 (BP Location: Left Arm, Patient Position: Sitting)    Pulse 93    Temp 98.4 F (36.9 C)    Ht 6\' 1"  (1.854 m)    Wt 242 lb (109.8 kg)    SpO2 98% Comment: ra   BMI 31.93 kg/m   Brief Summary:  James Blankenship is a 55 y.o. male with obstructive sleep apnea and lung nodules.      Subjective:   He was seen previously by Dr. Ander Slade.  He had sleep study in 2020 and 2021.  Both showed severe obstructive sleep apnea with very low oxygen level.  He had titration study in November 2021 and started on Bipap 24/20 with back up rate 10.  This was then decreased to 22/18 cm H2O.  He previously tried CPAP.  He has full face mask.  He hasn't been able to use Bipap for several months.  Mask wasn't comfortable and pressure still too high.  He has trouble staying awake during the day, and falls asleep whenever he sits quiet.  He was seen previously by Dr. Melida Quitter.  Previously recommend to consider phase 2 surgeries for sleep apnea, but he opted against these.  He was more recently informed that he wouldn't be a candidate for  an Inspire device.  He works at [employer].  He has extensive second hand tobacco exposure.  He had CT chest in August 2021 that showed several ground glass nodules.  Physical Exam:   Appearance - well kempt   ENMT - no sinus tenderness, no oral exudate, no LAN, Mallampati 4 airway, no stridor, deviated nasal septum  Respiratory - equal breath sounds bilaterally, no wheezing or rales  CV - s1s2 regular rate and rhythm, no murmurs  Ext - no clubbing, no edema  Skin - no rashes  Psych - normal mood and affect   Chest Imaging:  CT chest 02/24/20 >> 6 mm GGO nodule RUL, 4 mm GGO nodule RUL, 2 mm nodule RUL, several nodules up to 3 mm in RLL  Sleep Tests:  HST 07/16/18 >> AHI 77.5, SpO2 low 62% HST 03/05/20 >> AHI 71.8, SpO2 low 56% Bipap titration 05/15/20 >> Bipap 24/20 cm H2O with back up rate of 10  Cardiac Tests:  Echo 06/13/20 >> EF 55 to 60%, RVSP 17.9 mmHg  Social History:  He  reports that he has never smoked. He has never used smokeless tobacco. He reports that he does not drink alcohol and does not use drugs.  Family History:  His family history includes Cancer in his father; Cancer - Other in his father.     Assessment/Plan:   Obstructive sleep apnea. - he has been tried on CPAP, but this was not effective;  he was tried on different mask types - he uses [employer] for his DME - he has been set up with [facility] with full face mask, but set up hasn't been comfortable to use consistently - will change Bipap ST to 18/14 cm H2O with back up rate 10 - explained it will need to be a balance between controlling his sleep apnea and adjusting set up so it is comfortable for him to wear - will have him refit for small interface  Lung nodules. - needs follow up CT chest without contrast in August 2023  Time Spent Involved in Patient Care on Day of Examination:  34 minutes  Follow up:   Patient Instructions  Will have [employer] change your Bipap setting to 18/14 cm water  pressure and refit your Bipap mask  Message if you are still having trouble with Bipap set up after making these changes  Will arrange for CT chest in August 2023 and follow up after that  Medication List:   Allergies as of 06/30/2021       Reactions   Penicillins Swelling   Has patient had a PCN reaction causing immediate rash, facial/tongue/throat swelling, SOB or lightheadedness with hypotension: Yes Has patient had a PCN reaction causing severe rash involving mucus membranes or skin necrosis: No Has patient had a PCN reaction that required hospitalization No Has patient had a PCN reaction occurring within the last 10 years: No If all of the above answers are "NO", then may proceed with Cephalosporin        Medication List        Accurate as of June 30, 2021 12:33 PM. If you have any questions, ask your nurse or doctor.          cetirizine 10 MG tablet Commonly known as: ZYRTEC Take 1 tablet (10 mg total) by mouth daily.   chlorthalidone 25 MG tablet Commonly known as: HYGROTON Take 12.5 mg by mouth every morning.   meclizine 25 MG tablet Commonly known as: ANTIVERT Take 1 tablet (25 mg total) by mouth 3 (three) times daily as needed for dizziness.   olmesartan 20 MG tablet Commonly known as: BENICAR Take 1 tablet (20 mg total) by mouth daily.   pantoprazole 40 MG tablet Commonly known as: PROTONIX TAKE 1 TABLET BY MOUTH EVERY MORNING 30 MINUTES BEFORE BREAKFAST   rosuvastatin 20 MG tablet Commonly known as: Crestor Take 1 tablet (20 mg total) by mouth daily.   sildenafil 20 MG tablet Commonly known as: REVATIO Take 3-5 tablets by mouth 30-60 minutes prior to sexual activity   SYRINGE 3CC/23GX1" 23G X 1" 3 ML Misc 1 each by Does not apply route every 14 (fourteen) days.   testosterone cypionate 200 MG/ML injection Commonly known as: DEPOTESTOSTERONE CYPIONATE Inject 0.5 mLs (100 mg total) into the muscle every 14 (fourteen) days.   valACYclovir  1000 MG tablet Commonly known as: VALTREX Take 1,000 mg by mouth daily.   Vitamin D-3 125 MCG (5000 UT) Tabs Take 10,000 Units by mouth daily at 12 noon.        Signature:  Chesley Mires, MD [place] Pager - [phone] 06/30/2021, 12:33 PM

## 2021-06-30 NOTE — Patient Instructions (Signed)
Will have [employer] change your Bipap setting to 18/14 cm water pressure and refit your Bipap mask  Message if you are still having trouble with Bipap set up after making these changes  Will arrange for CT chest in August 2023 and follow up after that

## 2021-07-04 ENCOUNTER — Encounter: Payer: Self-pay | Admitting: Urology

## 2021-07-04 ENCOUNTER — Other Ambulatory Visit: Payer: Self-pay

## 2021-07-04 ENCOUNTER — Ambulatory Visit (INDEPENDENT_AMBULATORY_CARE_PROVIDER_SITE_OTHER): Payer: 59 | Admitting: Urology

## 2021-07-04 VITALS — BP 128/84 | HR 89 | Ht 72.0 in | Wt 242.0 lb

## 2021-07-04 DIAGNOSIS — N529 Male erectile dysfunction, unspecified: Secondary | ICD-10-CM | POA: Diagnosis not present

## 2021-07-04 DIAGNOSIS — R351 Nocturia: Secondary | ICD-10-CM

## 2021-07-04 DIAGNOSIS — F524 Premature ejaculation: Secondary | ICD-10-CM

## 2021-07-04 DIAGNOSIS — N281 Cyst of kidney, acquired: Secondary | ICD-10-CM

## 2021-07-04 LAB — MICROSCOPIC EXAMINATION
Bacteria, UA: NONE SEEN
Epithelial Cells (non renal): NONE SEEN /hpf (ref 0–10)
Renal Epithel, UA: NONE SEEN /hpf
WBC, UA: NONE SEEN /hpf (ref 0–5)

## 2021-07-04 LAB — URINALYSIS, ROUTINE W REFLEX MICROSCOPIC
Bilirubin, UA: NEGATIVE
Glucose, UA: NEGATIVE
Ketones, UA: NEGATIVE
Leukocytes,UA: NEGATIVE
Nitrite, UA: NEGATIVE
Specific Gravity, UA: 1.025 (ref 1.005–1.030)
Urobilinogen, Ur: 0.2 mg/dL (ref 0.2–1.0)
pH, UA: 5.5 (ref 5.0–7.5)

## 2021-07-04 MED ORDER — PAROXETINE HCL 20 MG PO TABS: ORAL_TABLET | ORAL | 3 refills | Status: DC

## 2021-07-04 NOTE — Progress Notes (Signed)
Urological Symptom Review  Patient is experiencing the following symptoms: Frequent urination Get up at night to urinate   Review of Systems  Gastrointestinal (upper)  : Negative for upper GI symptoms  Gastrointestinal (lower) : Negative for lower GI symptoms  Constitutional : Fatigue  Skin: Negative for skin symptoms  Eyes: Negative for eye symptoms  Ear/Nose/Throat : Sinus problems  Hematologic/Lymphatic: Negative for Hematologic/Lymphatic symptoms  Cardiovascular : Leg swelling  Respiratory : Negative for respiratory symptoms  Endocrine: Negative for endocrine symptoms  Musculoskeletal: Back pain Joint pain  Neurological: Negative for neurological symptoms  Psychologic: Negative for psychiatric symptoms

## 2021-07-04 NOTE — Progress Notes (Signed)
Assessment: 1. Premature ejaculation   2. Organic impotence   3. Bilateral renal cysts   4. Nocturia     Plan: Diagnosis and management of premature ejaculation discussed with the patient.  Options for management reviewed including behavioral therapy, topical therapy, medical therapy.  Patient provided to the patient. Recommend trial of Paxil 20 mg 3-4 hours prior to intercourse.  Prescription provided. He may discontinue the sildenafil as he is not having difficulty achieving his erections. Return to office in 2 months.  Chief Complaint:  Chief Complaint  Patient presents with   Erectile Dysfunction    History of Present Illness:  James Blankenship is a 55 y.o. year old male who is seen for further evaluation of renal cyst, possible renal calculus, and erectile dysfunction.   At his initial visit in 9/22, he reported a several month history of left upper abdominal/lower chest pain.  He was evaluated with a CT scan without contrast on 12/18/2020.  This study showed mild bilateral perinephric stranding, no evidence of obstruction, and no renal or ureteral calculi.  He was recently evaluated with a renal ultrasound on 02/21/2021 which showed small bilateral simple renal cysts and a 11 mm shadowing echogenic focus in the lower pole of the left kidney without evidence of hydronephrosis.  No prior history of kidney stones.  He reported some discomfort in the left lower chest area associated with coughing, sneezing, and movements.  No flank pain.  He reported some urinary frequency and nocturia 2-4 times per night.  No dysuria or gross hematuria.  He also has a history of erectile dysfunction with difficulty achieving and maintaining his erections.  No pain or curvature.  No decrease in his libido.  He had not tried any medical therapy for erectile dysfunction.  He does not take nitrates.  He is currently on testosterone replacement therapy receiving testosterone injections 100 mg every 2 weeks.  This has  been managed by his PCP.  PSA from 5/22: 0.9  He was given a trial of sildenafil 20 mg tablets 2-5 as needed intercourse at his visit in 9/22.  He returns today for follow-up.  He has actually noted improvement in his erectile dysfunction.  He currently is not having any difficulty achieving erections.  He does report problems with premature ejaculation.  This has been present for a number of years.  He reports ejaculation in <5 minutes after vaginal penetration.  He is otherwise able to maintain his erection.  No pain with ejaculation. He does report some occasional frequency and nocturia.  No dysuria or gross hematuria. AUA score = 12 today.  Portions of the above documentation were copied from a prior visit for review purposes only.   Past Medical History:  Past Medical History:  Diagnosis Date   BPH (benign prostatic hyperplasia)    ED (erectile dysfunction)    GERD (gastroesophageal reflux disease)    Hyperlipidemia    Hypertension    Male hypogonadism 03/23/2019   Nasal septal deviation    Nephrolithiasis    Pneumonia due to COVID-19 virus    Secondary hyperparathyroidism ([place])    Sleep apnea    Stage 3a chronic kidney disease (CKD) ([place])    Vitamin D deficiency disease 03/23/2019    Past Surgical History:  Past Surgical History:  Procedure Laterality Date   BIOPSY  11/01/2015   Procedure: BIOPSY;  Surgeon: Danie Binder, MD;  [facility];  Service: Endoscopy;;  gastric   CHOLECYSTECTOMY  COLONOSCOPY WITH PROPOFOL N/A 11/01/2015   Dr. Oneida Alar: internal hemorrhoids, otherwise normal.    ESOPHAGOGASTRODUODENOSCOPY  06/10/2009   Distal esophagea stricture dialted to 7mm. Bx from distal esophagus (neg). erythema in body and antrum.   ESOPHAGOGASTRODUODENOSCOPY  05/30/2009   distal peptic stricture dialted to 2mm, diffuse bx proven gastritis, no .hpylori   ESOPHAGOGASTRODUODENOSCOPY (EGD) WITH PROPOFOL N/A 11/01/2015   Dr. Oneida Alar: benign-appearing esophageal  stenosis s/p dilation, chronic gastritis    HERNIA REPAIR Left    groin   SAVORY DILATION N/A 11/01/2015   Procedure: SAVORY DILATION;  Surgeon: Danie Binder, MD;  [facility];  Service: Endoscopy;  Laterality: N/A;   VASECTOMY      Allergies:  Allergies  Allergen Reactions   Penicillins Swelling    Has patient had a PCN reaction causing immediate rash, facial/tongue/throat swelling, SOB or lightheadedness with hypotension: Yes Has patient had a PCN reaction causing severe rash involving mucus membranes or skin necrosis: No Has patient had a PCN reaction that required hospitalization No Has patient had a PCN reaction occurring within the last 10 years: No If all of the above answers are "NO", then may proceed with Cephalosporin    Family History:  Family History  Problem Relation Age of Onset   Cancer Father    Cancer - Other Father        mesothelioma, deceased at age 36    Colon cancer Neg Hx     Social History:  Social History   Tobacco Use   Smoking status: Never   Smokeless tobacco: Never  Vaping Use   Vaping Use: Never used  Substance Use Topics   Alcohol use: No    Alcohol/week: 0.0 standard drinks   Drug use: No    ROS: Constitutional:  Negative for fever, chills, weight loss CV: Negative for chest pain, previous MI, hypertension Respiratory:  Negative for shortness of breath, wheezing, sleep apnea, frequent cough GI:  Negative for nausea, vomiting, bloody stool, GERD  Physical exam: BP 128/84    Pulse 89    Ht 6' (1.829 m)    Wt 242 lb (109.8 kg)    BMI 32.82 kg/m  GENERAL APPEARANCE:  Well appearing, well developed, well nourished, NAD HEENT:  Atraumatic, normocephalic, oropharynx clear NECK:  Supple without lymphadenopathy or thyromegaly ABDOMEN:  Soft, non-tender, no masses EXTREMITIES:  Moves all extremities well, without clubbing, cyanosis, or edema NEUROLOGIC:  Alert and oriented x 3, normal gait, CN II-XII grossly intact MENTAL  STATUS:  appropriate BACK:  Non-tender to palpation, No CVAT SKIN:  Warm, dry, and intact   Results: U/A: 0-2 RBC, 1+ protein

## 2021-07-04 NOTE — Progress Notes (Signed)
Labs are stable.

## 2021-07-04 NOTE — Telephone Encounter (Signed)
Switch okay with me

## 2021-07-04 NOTE — Telephone Encounter (Signed)
Looks like patient was seen on 06/30/21 by Dr. Halford Chessman. Nothing further needed at this time.

## 2021-07-05 LAB — VITAMIN D 25 HYDROXY (VIT D DEFICIENCY, FRACTURES): Vit D, 25-Hydroxy: 51.8 ng/mL (ref 30.0–100.0)

## 2021-07-05 LAB — TESTOSTERONE, FREE, TOTAL, SHBG
Sex Hormone Binding: 24.1 nmol/L (ref 19.3–76.4)
Testosterone, Free: 8.9 pg/mL (ref 7.2–24.0)
Testosterone: 236 ng/dL — ABNORMAL LOW (ref 264–916)

## 2021-07-05 LAB — CBC WITH DIFFERENTIAL/PLATELET
Basophils Absolute: 0.1 10*3/uL (ref 0.0–0.2)
Basos: 1 %
EOS (ABSOLUTE): 0.2 10*3/uL (ref 0.0–0.4)
Eos: 4 %
Hematocrit: 42.6 % (ref 37.5–51.0)
Hemoglobin: 14.3 g/dL (ref 13.0–17.7)
Immature Grans (Abs): 0 10*3/uL (ref 0.0–0.1)
Immature Granulocytes: 1 %
Lymphocytes Absolute: 2 10*3/uL (ref 0.7–3.1)
Lymphs: 33 %
MCH: 30.6 pg (ref 26.6–33.0)
MCHC: 33.6 g/dL (ref 31.5–35.7)
MCV: 91 fL (ref 79–97)
Monocytes Absolute: 0.7 10*3/uL (ref 0.1–0.9)
Monocytes: 12 %
Neutrophils Absolute: 3 10*3/uL (ref 1.4–7.0)
Neutrophils: 49 %
Platelets: 201 10*3/uL (ref 150–450)
RBC: 4.67 x10E6/uL (ref 4.14–5.80)
RDW: 12.9 % (ref 11.6–15.4)
WBC: 6 10*3/uL (ref 3.4–10.8)

## 2021-07-05 LAB — LIPID PANEL WITH LDL/HDL RATIO
Cholesterol, Total: 121 mg/dL (ref 100–199)
HDL: 36 mg/dL — ABNORMAL LOW (ref >39)
LDL Chol Calc (NIH): 63 mg/dL (ref 0–99)
LDL/HDL Ratio: 1.8 ratio (ref 0.0–3.6)
Triglycerides: 122 mg/dL (ref 0–149)
VLDL Cholesterol Cal: 22 mg/dL (ref 5–40)

## 2021-07-05 LAB — CMP14+EGFR
ALT: 49 IU/L — ABNORMAL HIGH (ref 0–44)
AST: 26 IU/L (ref 0–40)
Albumin/Globulin Ratio: 1.8 (ref 1.2–2.2)
Albumin: 4.3 g/dL (ref 3.8–4.9)
Alkaline Phosphatase: 69 IU/L (ref 44–121)
BUN/Creatinine Ratio: 15 (ref 9–20)
BUN: 18 mg/dL (ref 6–24)
Bilirubin Total: 0.4 mg/dL (ref 0.0–1.2)
CO2: 23 mmol/L (ref 20–29)
Calcium: 8.8 mg/dL (ref 8.7–10.2)
Chloride: 103 mmol/L (ref 96–106)
Creatinine, Ser: 1.19 mg/dL (ref 0.76–1.27)
Globulin, Total: 2.4 g/dL (ref 1.5–4.5)
Glucose: 92 mg/dL (ref 70–99)
Potassium: 4.6 mmol/L (ref 3.5–5.2)
Sodium: 141 mmol/L (ref 134–144)
Total Protein: 6.7 g/dL (ref 6.0–8.5)
eGFR: 72 mL/min/{1.73_m2} (ref >59)

## 2021-07-12 ENCOUNTER — Other Ambulatory Visit: Payer: Self-pay

## 2021-07-12 ENCOUNTER — Encounter: Payer: Self-pay | Admitting: Surgery

## 2021-07-12 ENCOUNTER — Ambulatory Visit: Payer: 59 | Admitting: Surgery

## 2021-07-12 ENCOUNTER — Encounter: Payer: Self-pay | Admitting: *Deleted

## 2021-07-12 VITALS — BP 146/81 | HR 90 | Temp 98.1°F | Resp 16 | Ht 73.0 in | Wt 242.0 lb

## 2021-07-12 DIAGNOSIS — K429 Umbilical hernia without obstruction or gangrene: Secondary | ICD-10-CM | POA: Diagnosis not present

## 2021-07-12 DIAGNOSIS — M6208 Separation of muscle (nontraumatic), other site: Secondary | ICD-10-CM

## 2021-07-12 NOTE — Progress Notes (Signed)
[facility] History and Physical  Reason for Referral: Umbilical hernia Referring Physician: Demetrius Revel, MD  Chief Complaint   New Patient (Initial Visit)     James Blankenship is a 56 y.o. male.  HPI: He presents for evaluation of abdominal distention, evaluation for possible hernia, and bilateral flank pain.  He states that over the past few months he has been noting increased flank pain, more on the left, and abdominal distention with bulging in the midline abdomen.  He states that when he attempts to get out of bed or sit up, that he needs to apply pressure to this area to control his pain.  He admits that he eats an unhealthy diet, and has not been significantly trying to lose weight at this time.  He denies nausea, vomiting, and issues with bowel movements.  He has a history of laparoscopic cholecystectomy and open left inguinal hernia repair in the past.  His medical history significant for hyperlipidemia, GERD, and kidney disease.  He denies use of blood thinning medications.  He denies use of alcohol, tobacco, and illicit drugs.  He is up-to-date on his colonoscopy.  Past Medical History:  Diagnosis Date   BPH (benign prostatic hyperplasia)    ED (erectile dysfunction)    GERD (gastroesophageal reflux disease)    Hyperlipidemia    Hypertension    Male hypogonadism 03/23/2019   Nasal septal deviation    Nephrolithiasis    Pneumonia due to COVID-19 virus    Secondary hyperparathyroidism ([place])    Sleep apnea    Stage 3a chronic kidney disease (CKD) ([place])    Vitamin D deficiency disease 03/23/2019    Past Surgical History:  Procedure Laterality Date   BIOPSY  11/01/2015   Procedure: BIOPSY;  Surgeon: Danie Binder, MD;  [facility];  Service: Endoscopy;;  gastric   CHOLECYSTECTOMY     COLONOSCOPY WITH PROPOFOL N/A 11/01/2015   Dr. Oneida Alar: internal hemorrhoids, otherwise normal.    ESOPHAGOGASTRODUODENOSCOPY  06/10/2009   Distal esophagea stricture  dialted to 80mm. Bx from distal esophagus (neg). erythema in body and antrum.   ESOPHAGOGASTRODUODENOSCOPY  05/30/2009   distal peptic stricture dialted to 50mm, diffuse bx proven gastritis, no .hpylori   ESOPHAGOGASTRODUODENOSCOPY (EGD) WITH PROPOFOL N/A 11/01/2015   Dr. Oneida Alar: benign-appearing esophageal stenosis s/p dilation, chronic gastritis    HERNIA REPAIR Left    groin   SAVORY DILATION N/A 11/01/2015   Procedure: SAVORY DILATION;  Surgeon: Danie Binder, MD;  [facility];  Service: Endoscopy;  Laterality: N/A;   VASECTOMY      Family History  Problem Relation Age of Onset   Cancer Father    Cancer - Other Father        mesothelioma, deceased at age 91    Colon cancer Neg Hx     Social History   Tobacco Use   Smoking status: Never   Smokeless tobacco: Never  Vaping Use   Vaping Use: Never used  Substance Use Topics   Alcohol use: No    Alcohol/week: 0.0 standard drinks   Drug use: No    Medications: I have reviewed the patient's current medications. Allergies as of 07/12/2021       Reactions   Penicillins Swelling   Has patient had a PCN reaction causing immediate rash, facial/tongue/throat swelling, SOB or lightheadedness with hypotension: Yes Has patient had a PCN reaction causing severe rash involving mucus membranes or skin necrosis: No Has patient had a PCN  reaction that required hospitalization No Has patient had a PCN reaction occurring within the last 10 years: No If all of the above answers are "NO", then may proceed with Cephalosporin        Medication List        Accurate as of July 12, 2021  3:51 PM. If you have any questions, ask your nurse or doctor.          cetirizine 10 MG tablet Commonly known as: ZYRTEC Take 1 tablet (10 mg total) by mouth daily.   chlorthalidone 25 MG tablet Commonly known as: HYGROTON Take 12.5 mg by mouth every morning.   cholecalciferol 25 MCG (1000 UNIT) tablet Commonly known as: VITAMIN  D3 Take 1,000 Units by mouth daily. What changed: Another medication with the same name was removed. Continue taking this medication, and follow the directions you see here. Changed by: Alexarae Oliva A Herminio Kniskern, DO   meclizine 25 MG tablet Commonly known as: ANTIVERT Take 1 tablet (25 mg total) by mouth 3 (three) times daily as needed for dizziness.   olmesartan 20 MG tablet Commonly known as: BENICAR Take 1 tablet (20 mg total) by mouth daily.   pantoprazole 40 MG tablet Commonly known as: PROTONIX TAKE 1 TABLET BY MOUTH EVERY MORNING 30 MINUTES BEFORE BREAKFAST   PARoxetine 20 MG tablet Commonly known as: PAXIL Take 1 tablet by mouth 3-4 hours prior to intercourse   rosuvastatin 20 MG tablet Commonly known as: Crestor Take 1 tablet (20 mg total) by mouth daily. What changed: how much to take   sildenafil 20 MG tablet Commonly known as: REVATIO Take 3-5 tablets by mouth 30-60 minutes prior to sexual activity   SYRINGE 3CC/23GX1" 23G X 1" 3 ML Misc 1 each by Does not apply route every 14 (fourteen) days.   testosterone cypionate 200 MG/ML injection Commonly known as: DEPOTESTOSTERONE CYPIONATE Inject 0.5 mLs (100 mg total) into the muscle every 14 (fourteen) days.   valACYclovir 1000 MG tablet Commonly known as: VALTREX Take 1,000 mg by mouth daily.         ROS:  Constitutional: negative for chills, fatigue, fevers, and malaise Respiratory: negative for cough and wheezing Cardiovascular: negative for chest pain, dyspnea, and palpitations Gastrointestinal: positive for abdominal distention, negative for constipation, diarrhea, nausea, and vomiting Musculoskeletal: Positive for bilateral rib pain  Blood pressure (!) 146/81, pulse 90, temperature 98.1 F (36.7 C), temperature source Oral, resp. rate 16, height 6\' 1"  (1.854 m), weight 242 lb (109.8 kg), SpO2 96 %. Physical Exam Vitals reviewed.  Constitutional:      Appearance: Normal appearance.  HENT:     Head:  Normocephalic and atraumatic.  Eyes:     Extraocular Movements: Extraocular movements intact.     Pupils: Pupils are equal, round, and reactive to light.  Cardiovascular:     Rate and Rhythm: Normal rate and regular rhythm.  Pulmonary:     Effort: Pulmonary effort is normal.     Breath sounds: Normal breath sounds.  Abdominal:     Comments: Soft, mild distention nontender to percussion and palpation, <6.5 cm umbilical hernia palpable, cicatrix from laparoscopic cholecystectomy noted, diastases recti noted, tenderness in left side of abdomen overlying ribs, no rigidity, guarding, rebound tenderness  Musculoskeletal:        General: Normal range of motion.     Cervical back: Normal range of motion.  Skin:    General: Skin is warm and dry.  Neurological:     General: No focal deficit  present.     Mental Status: He is alert and oriented to person, place, and time.  Psychiatric:        Mood and Affect: Mood normal.        Behavior: Behavior normal.    Results: No results found for this or any previous visit (from the past 48 hour(s)).  No results found.  Assessment & Plan:  BAWI LAKINS is a 56 y.o. male who presents with abdominal distention, diastases recti, small umbilical hernia, and bilateral flank pain.  -Patient was noted to have diastases recti on exam.  It was explained that diastases recti is a loosening of his midline fascia and is not a true hernia.  Information on this disease process was given to the patient and he was advised to attempt some weight loss and abdominal exercises to improve his pain associated with sitting up.  He was also advised that he could attempt to wear an abdominal binder and see if this also alleviated some of that discomfort. -Patient was noted to have a small (<1.2 cm) umbilical hernia.  He denied any tenderness at the specific location, and given his lack of symptoms associated with the hernia, recommendation was to just clinically monitor at this  time.  He was given information on the umbilical hernia and advised to return to the ED if he were to experience significant nausea, vomiting, obstipation, pain and tenderness associated with umbilical bulge, or associated skin changes overlying hernia. -Patient was finally advised to follow-up with his PCP regarding his bilateral rib pain, as this is likely not associated with an intra-abdominal pathology -Follow up as needed  All questions were answered to the satisfaction of the patient and family.  Graciella Freer, DO [facility] [address] [place], [place] [address] [phone] (office)

## 2021-07-12 NOTE — Patient Instructions (Signed)
Schedule follow up with Dr. Posey Pronto for your flank/rib musculoskeletal pain.

## 2021-07-13 ENCOUNTER — Telehealth: Payer: Self-pay | Admitting: Pulmonary Disease

## 2021-07-13 IMAGING — DX DG CHEST 2V
2 series · 2 of 2 positions shown · non-contrast
Comparison: 12/09/2020

CLINICAL DATA: Right rib pain, cough

EXAM:
CHEST - 2 VIEW

[chest pa]
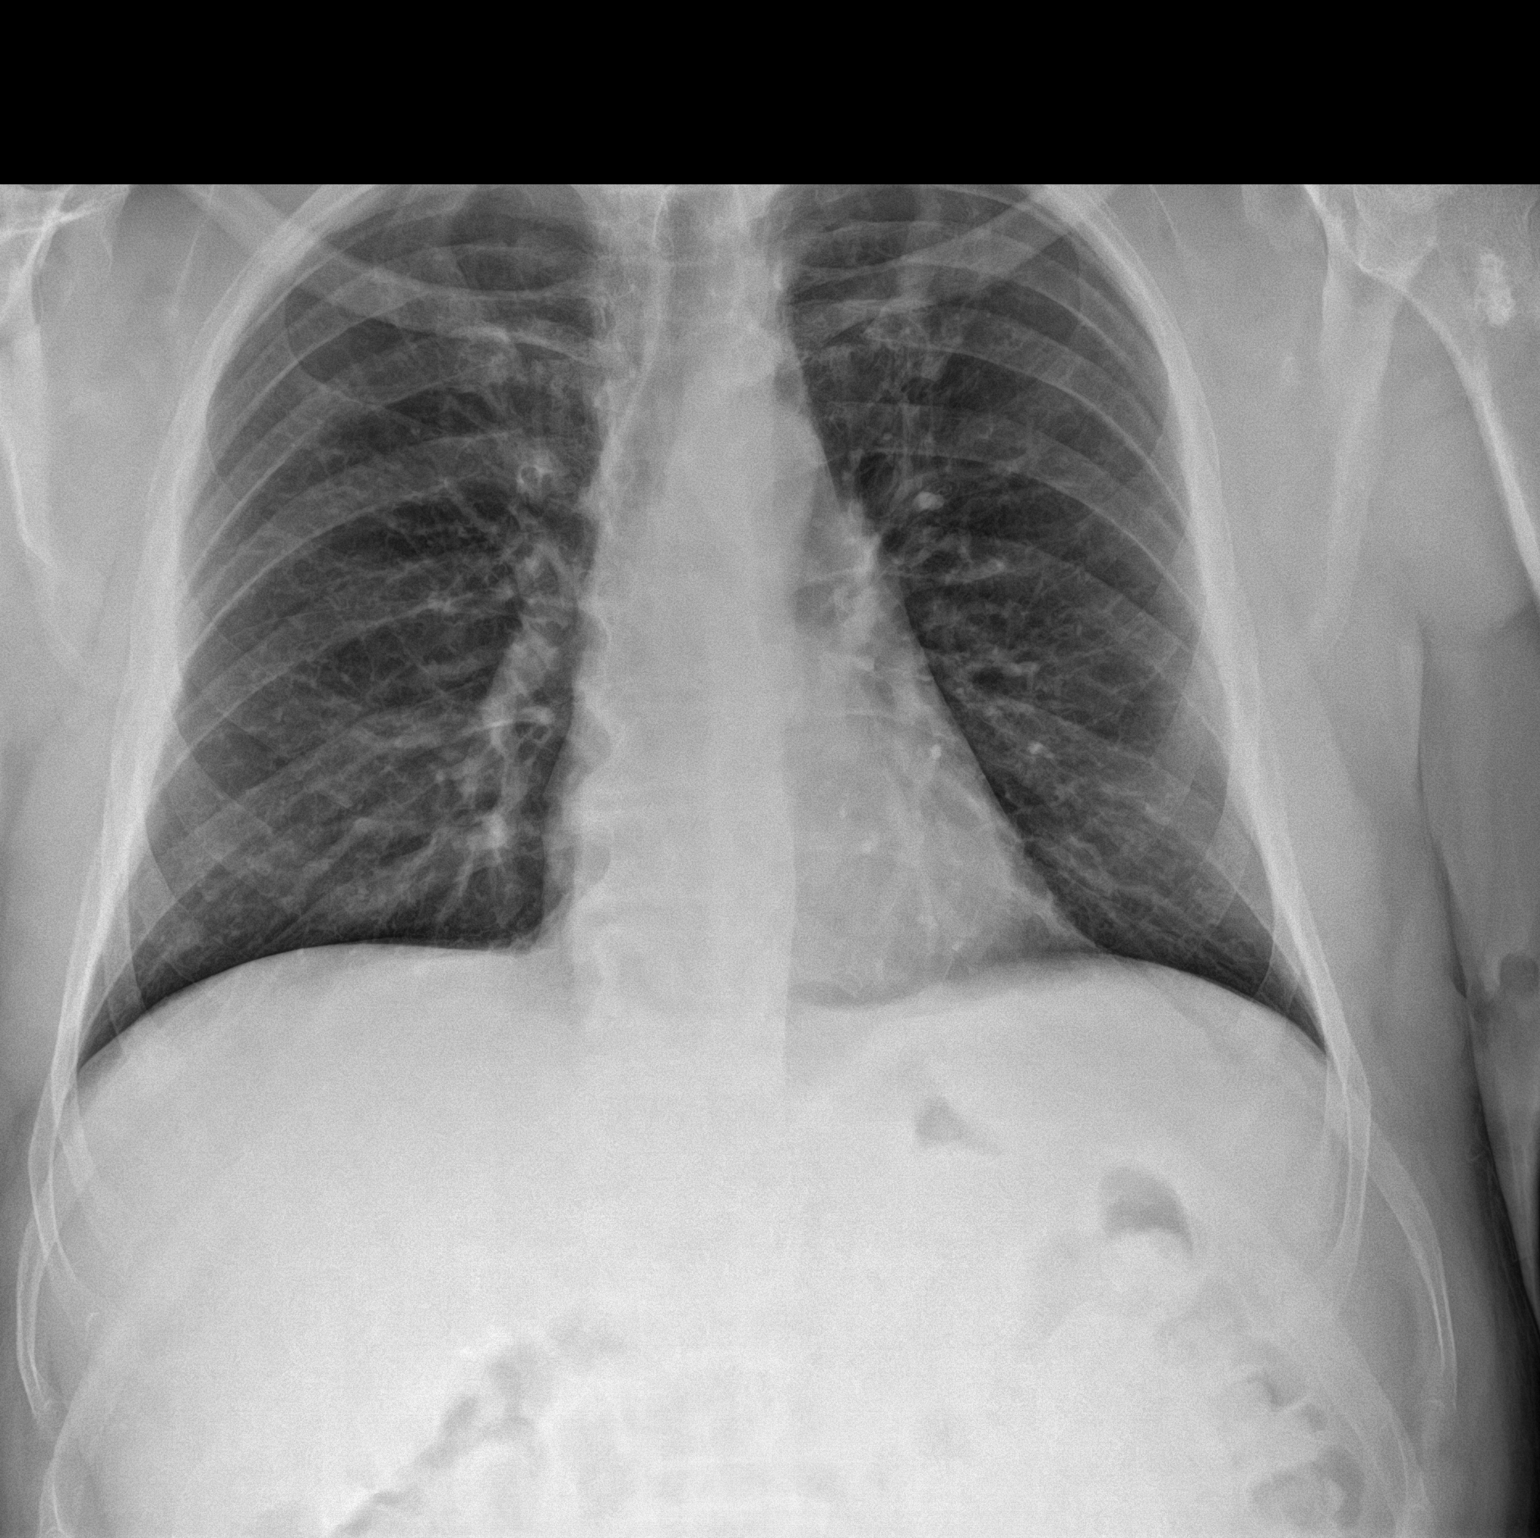

[chest lat]
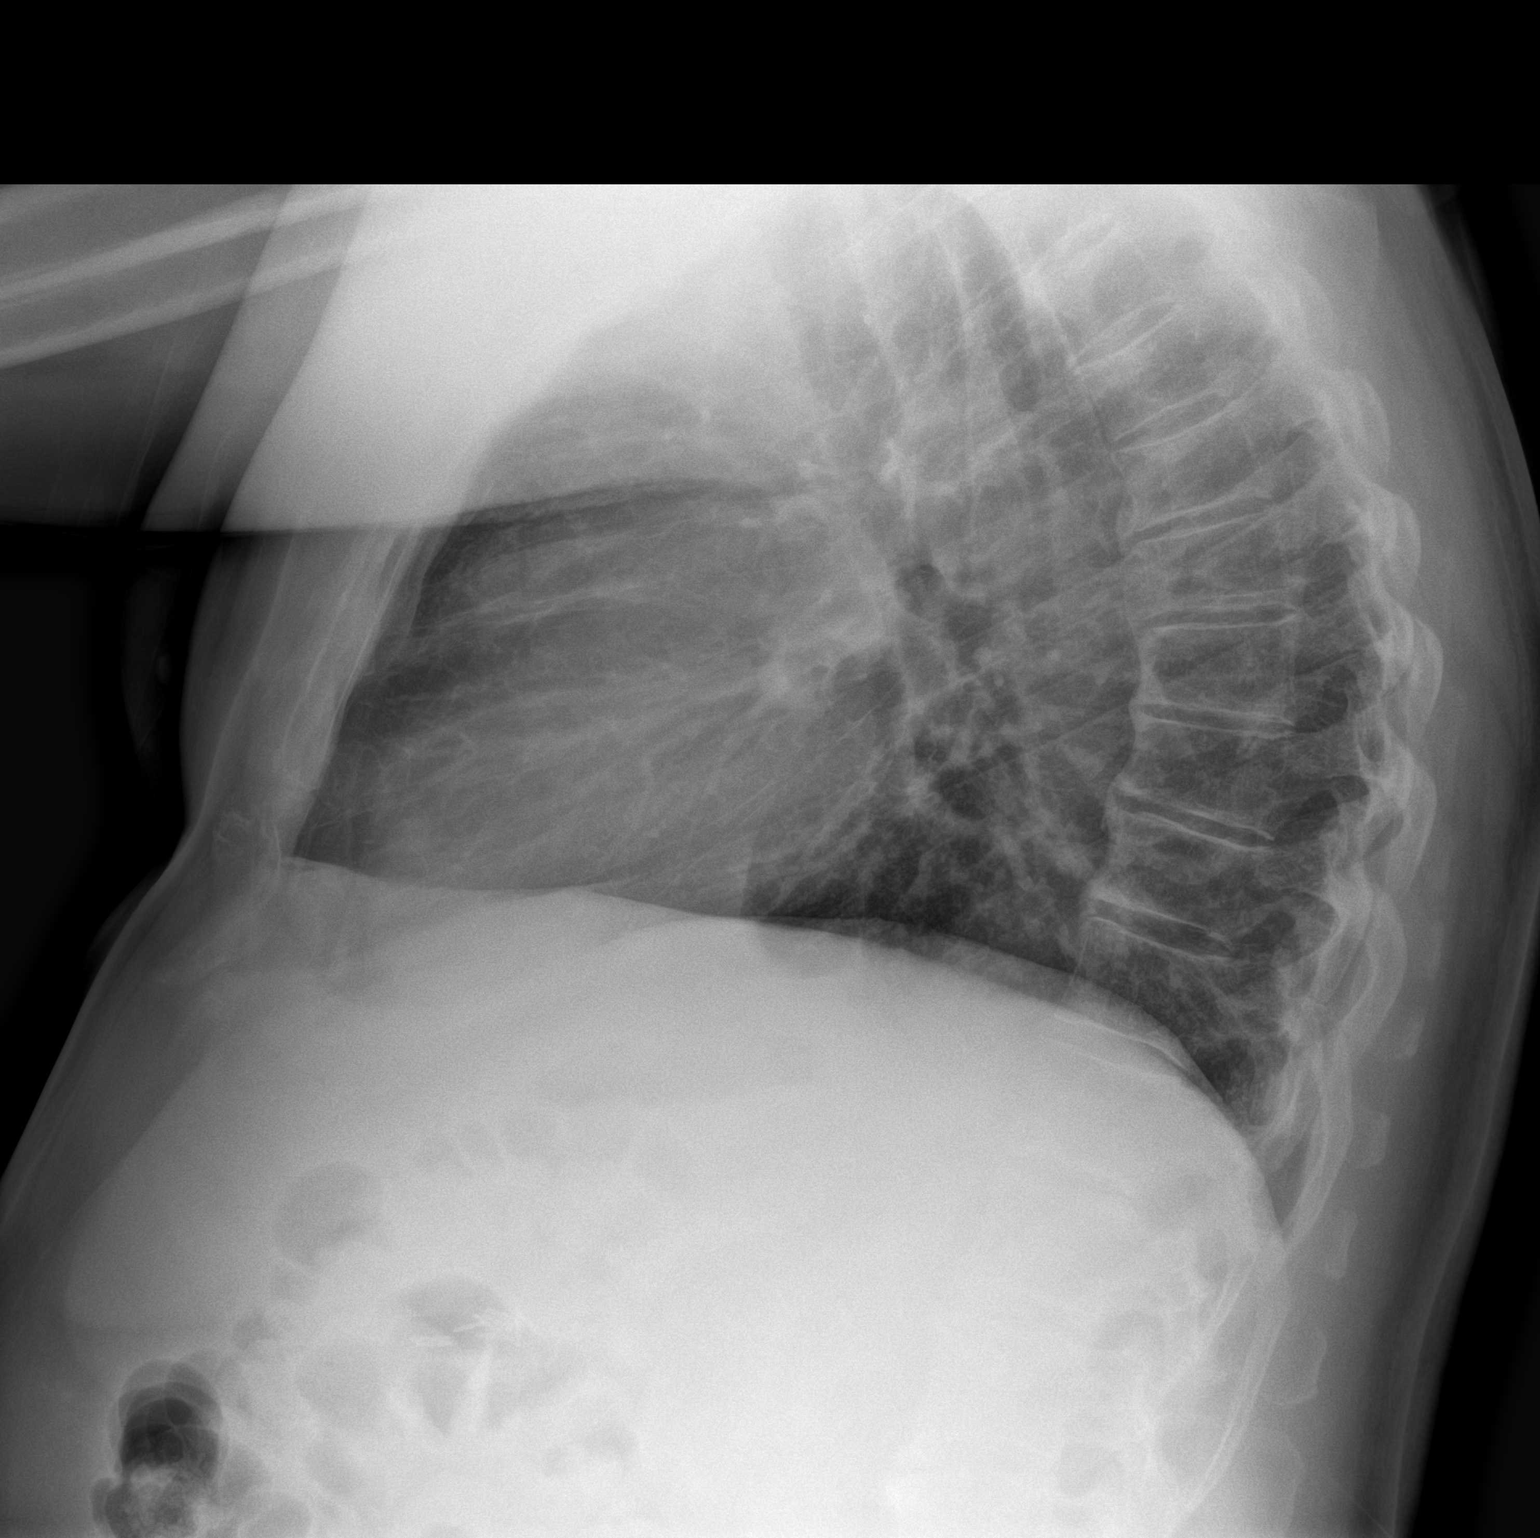

[2 of 2 positions shown; findings below may reference images not displayed]

FINDINGS: Heart and mediastinal contours are within normal limits. No focal
opacities or effusions. No acute bony abnormality. No visible rib
fracture or pneumothorax.
IMPRESSION: No active cardiopulmonary disease.

## 2021-07-13 NOTE — Telephone Encounter (Signed)
Called and left Melissa from [employer] a VM and faxed over phone notes to her to get an update.

## 2021-07-13 NOTE — Telephone Encounter (Signed)
Pt states he hasn't received new nose piece or mask (see order).  Called [employer] and rep he s/w seemed confused.  I let pt know when he called on 1/4 that it was on the same order about new mask or nose piece.  Please advise.  May leave message on vm.   Will contact  Melissa from [employer] to check into since it seems order is in place.

## 2021-07-14 IMAGING — CT CT ABD-PELV W/O CM
2 of 4 series · 16 of 46 positions shown, 18 images · non-contrast
Comparison: CT 10/03/2015

CLINICAL DATA: Right rib pain for 10 days, cough

EXAM:
CT ABDOMEN AND PELVIS WITHOUT CONTRAST
TECHNIQUE: Multidetector CT imaging of the abdomen and pelvis was performed
following the standard protocol without IV contrast.

[Series 5: coronal · coronal · 0.80mm/px · 3 of 122 slices shown]
[im 41/122  soft-tissue]
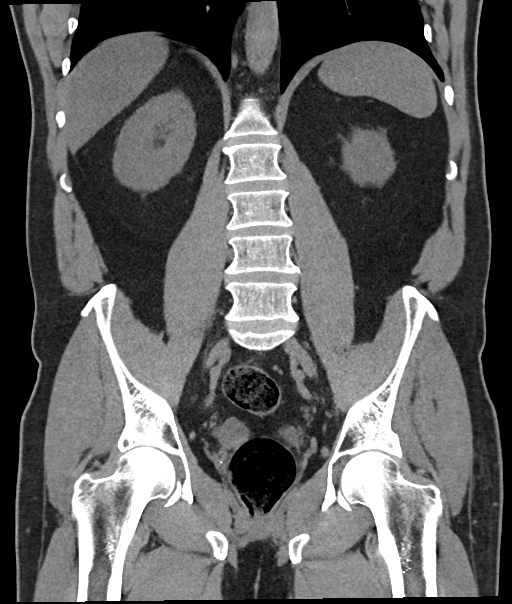
[im 54/122  soft-tissue]
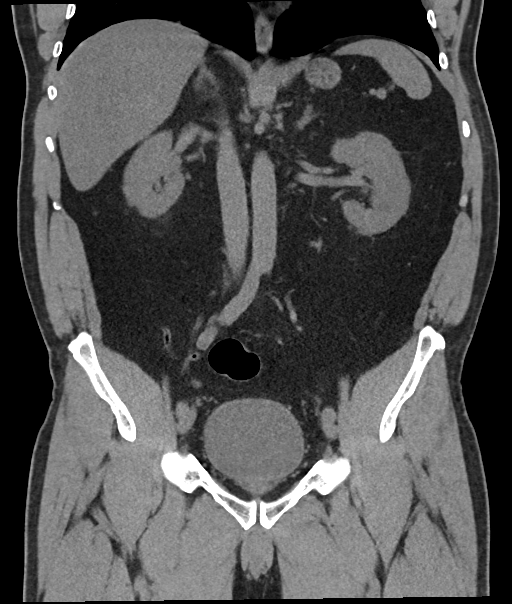
[im 68/122  soft-tissue]
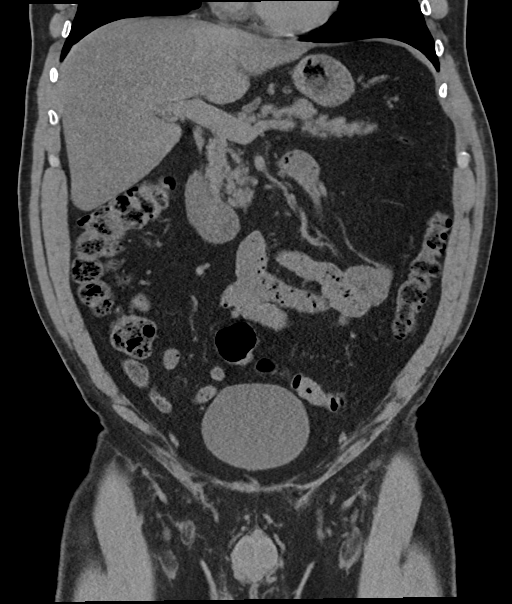

[Series 7: abd pel wo- · axial · 0.78mm/px · z∈[-1156,-716]mm · 13 of 96 slices shown, 15 images]
[im 4/96  soft-tissue]
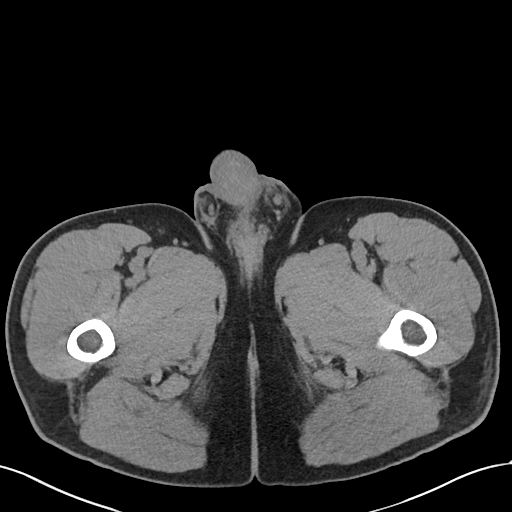
[im 4/96  bone]
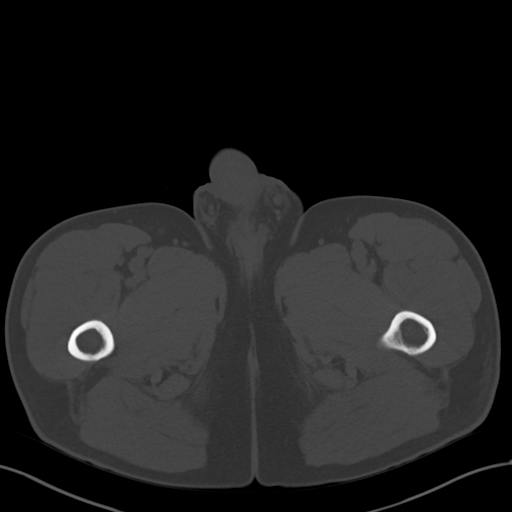
[im 12/96  soft-tissue]
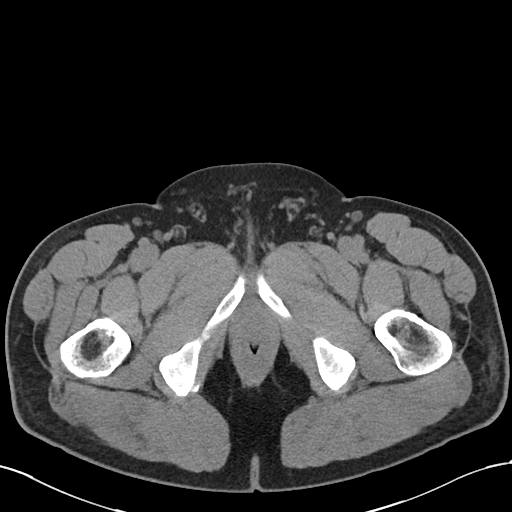
[im 20/96  soft-tissue]
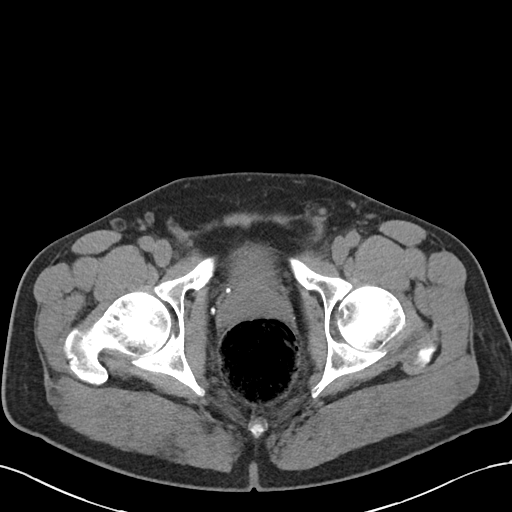
[im 27/96  soft-tissue]
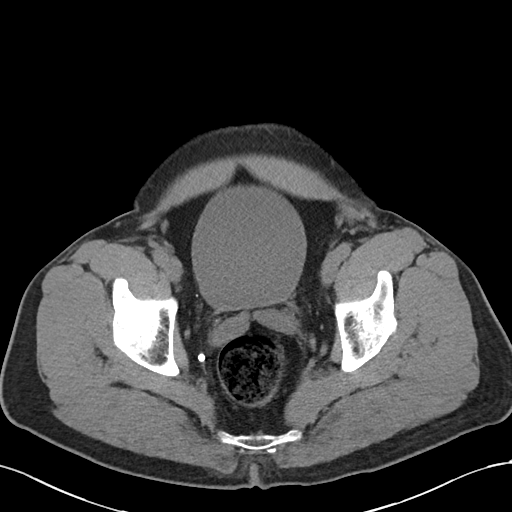
[im 35/96  soft-tissue]
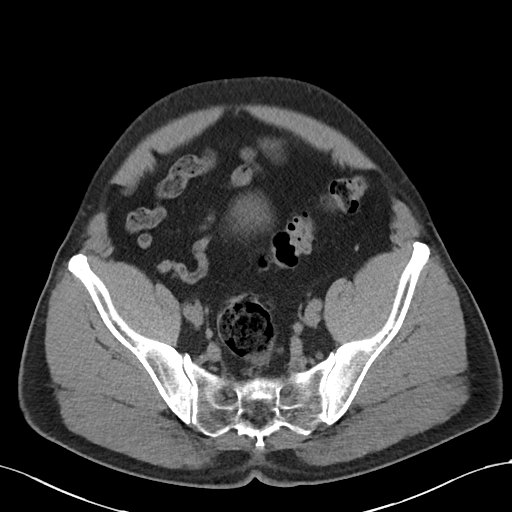
[im 42/96  soft-tissue]
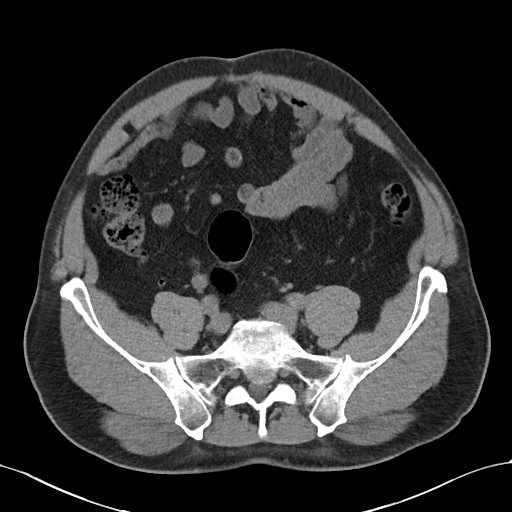
[im 50/96  soft-tissue]
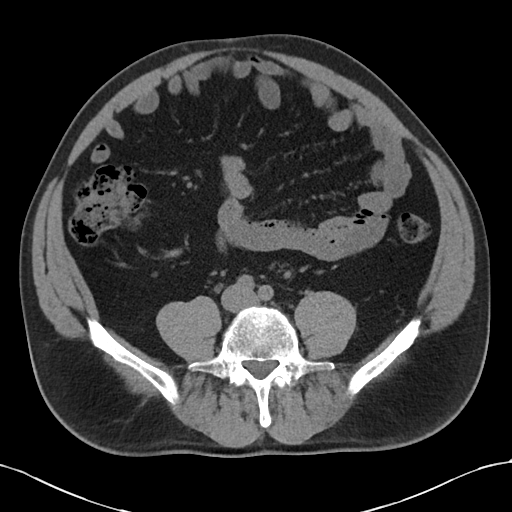
[im 54/96  soft-tissue]
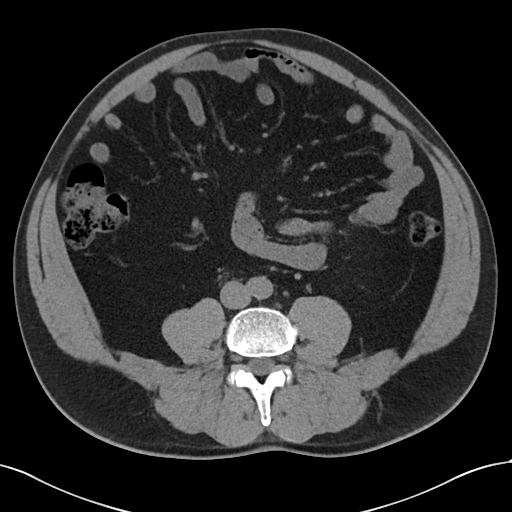
[im 61/96  soft-tissue]
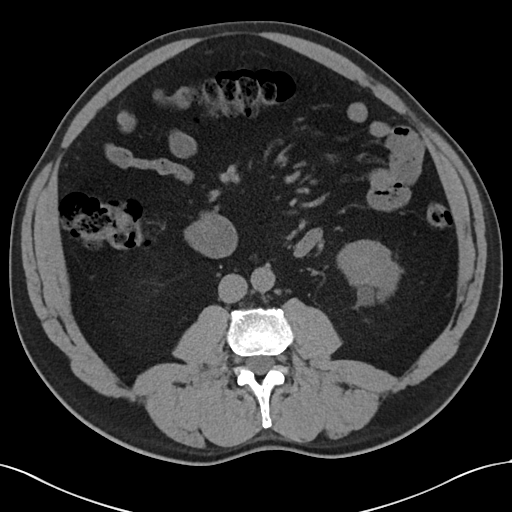
[im 61/96  bone]
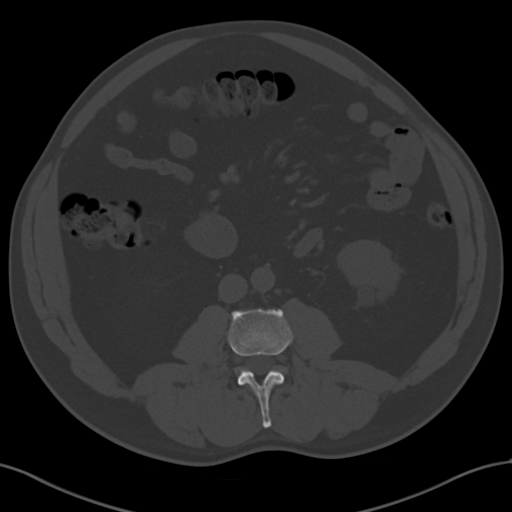
[im 69/96  soft-tissue]
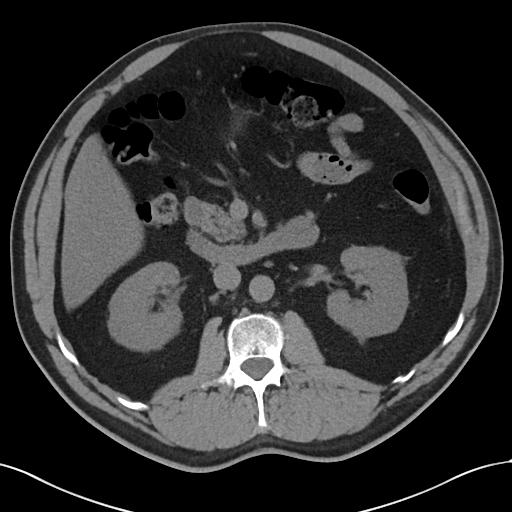
[im 77/96  soft-tissue]
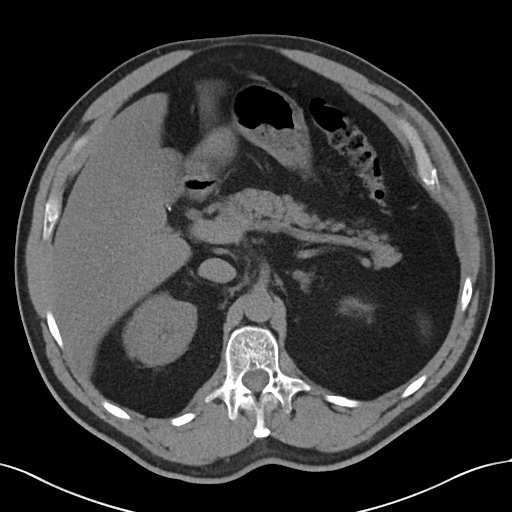
[im 84/96  soft-tissue]
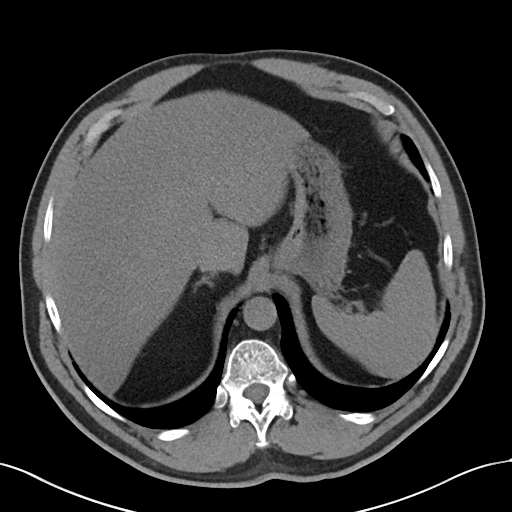
[im 92/96  soft-tissue]
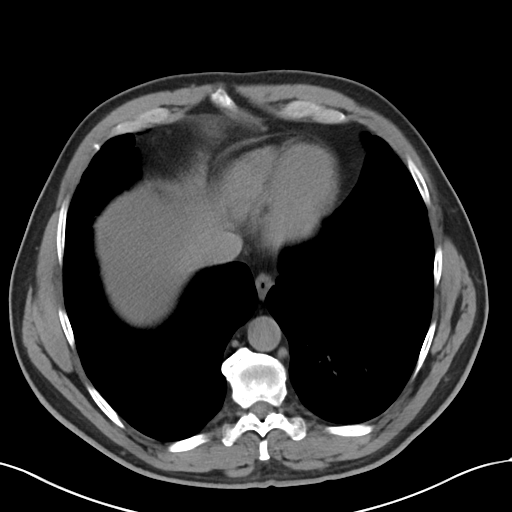

[16 of 46 positions shown; findings below may reference images not displayed]

FINDINGS: Lower chest: Lung bases are clear. Normal heart size. No pericardial
effusion. Lung bases are clear. Normal heart size. No pericardial
effusion. No visible rib fractures or lower chest wall abnormality.

Hepatobiliary: No visible focal liver lesions within limitations of
an unenhanced CT. Smooth liver surface contour. Normal hepatic
attenuation. Prior cholecystectomy. No significant biliary ductal
dilatation or visible intraductal gallstones.

Pancreas: No pancreatic ductal dilatation or surrounding
inflammatory changes.

Spleen: Normal in size. No concerning splenic lesions.

Adrenals/Urinary Tract: Normal adrenal glands. Mild bilateral
perinephric stranding is similar to prior, nonspecific finding. No
visible or contour deforming renal lesion. No visible urolithiasis
or hydronephrosis. Scattered pelvic phleboliths appear external to
the ureters and bladder. Urinary bladder is free of acute
abnormality. No visible bladder calculi or debris.

Stomach/Bowel: Distal esophagus, stomach and duodenal sweep are
unremarkable. No small bowel wall thickening or dilatation. No
evidence of obstruction. A normal appendix is visualized. No colonic
dilatation or wall thickening.

Vascular/Lymphatic: Minimal atherosclerotic plaque in the left
common and bilateral internal iliac arteries. Retroaortic left renal
vein. No other significant vascular findings. No aneurysm or
ectasia. No suspicious or enlarged lymph nodes in the included
lymphatic chains.

Reproductive: The prostate and seminal vesicles are unremarkable.

Other: No abdominopelvic free fluid or free gas. No bowel containing
hernias.

Musculoskeletal: No acute osseous abnormality or suspicious osseous
lesion. Mild degenerative changes spine, hips and pelvis.
IMPRESSION: 1. Clear lung bases without lower rib fracture or other acute
abnormality of the lower chest wall.
2. Mild bilateral perinephric stranding, unchanged from prior.
Nonspecific, can be seen with advanced age or diminished renal
function. Should exclude urinary symptoms, with urinalysis if
present.
3. Prior cholecystectomy.
4. No other acute or significant CT abnormality to provide cause for
patient's symptoms.

## 2021-07-21 NOTE — Telephone Encounter (Signed)
ATC patient to see if he had heard from [employer] or received his mask yet. No answer and VM is full

## 2021-07-26 NOTE — Telephone Encounter (Signed)
ATC patient x2 today to see if he had heard from [employer] and received his mask yet. No answer and vm is full . Will close encounter per protocol after 3 attempts to reach

## 2021-08-25 ENCOUNTER — Other Ambulatory Visit: Payer: Self-pay | Admitting: Family Medicine

## 2021-08-29 ENCOUNTER — Other Ambulatory Visit: Payer: Self-pay

## 2021-08-29 ENCOUNTER — Encounter: Payer: Self-pay | Admitting: Internal Medicine

## 2021-08-29 ENCOUNTER — Telehealth: Payer: Self-pay | Admitting: Pulmonary Disease

## 2021-08-29 ENCOUNTER — Ambulatory Visit: Payer: 59 | Admitting: Internal Medicine

## 2021-08-29 ENCOUNTER — Ambulatory Visit: Payer: 59 | Admitting: Urology

## 2021-08-29 VITALS — BP 122/84 | HR 87 | Resp 18 | Ht 72.0 in | Wt 251.6 lb

## 2021-08-29 DIAGNOSIS — M5442 Lumbago with sciatica, left side: Secondary | ICD-10-CM | POA: Diagnosis not present

## 2021-08-29 DIAGNOSIS — N529 Male erectile dysfunction, unspecified: Secondary | ICD-10-CM

## 2021-08-29 DIAGNOSIS — M5441 Lumbago with sciatica, right side: Secondary | ICD-10-CM

## 2021-08-29 DIAGNOSIS — E349 Endocrine disorder, unspecified: Secondary | ICD-10-CM

## 2021-08-29 DIAGNOSIS — G8929 Other chronic pain: Secondary | ICD-10-CM

## 2021-08-29 DIAGNOSIS — J329 Chronic sinusitis, unspecified: Secondary | ICD-10-CM

## 2021-08-29 MED ORDER — FLUTICASONE PROPIONATE 50 MCG/ACT NA SUSP: 2.0000 | Freq: Every day | NASAL | 6 refills | Status: AC

## 2021-08-29 NOTE — Assessment & Plan Note (Signed)
His chronic fatigue and erectile dysfunction could be due to testosterone deficiency Advised to discuss dosing of testosterone with urology

## 2021-08-29 NOTE — Telephone Encounter (Signed)
Patient came into office and spoke with me in person. He showed me an email from his insurance stating [employer] has refused to pay for his bipap machine and they are requesting additional documentation from Dr. Halford Chessman regarding need for bipap. Sent  a community message to [employer] staff to check into this and see if theres anything we can do as far as documentation to help pt.

## 2021-08-29 NOTE — Telephone Encounter (Signed)
Brad from [employer]:   [place], [place], [place]; Estill Dooms; [place], Reino Bellis, Herbert Spires,   I have sent your message note to our billing team to review and reach out to patient. We have spoke to him several times regarding this.   Thank you,   Leroy [place]          Since [employer] will reach out to patient regarding issue, nothing further needed on our end.

## 2021-08-29 NOTE — Assessment & Plan Note (Signed)
Likely allergic in etiology Continue Zyrtec and added Flonase for now Advised to perform sinus lavage at home

## 2021-08-29 NOTE — Assessment & Plan Note (Signed)
Advised to use sildenafil as needed Stop Paxil for now Followed by urology

## 2021-08-29 NOTE — Assessment & Plan Note (Signed)
Likely related to his activity Could be DDD of lumbar spine and/or spinal stenosis Advised to perform simple back exercises for now, material provided Heating pad and/or back brace for now Avoid heavy lifting and frequent bending If persistent, will refer to spine surgery

## 2021-08-29 NOTE — Patient Instructions (Addendum)
Please try to keep legs elevated. Okay to use compression stocking for leg swelling. Please continue taking Olmesartan and Chlorthalidone, which would also help with swelling. Please follow low salt diet.  Please stop taking Paxil and take Sildenafil as needed for erectile dysfunction.  Please talk to Dr Felipa Eth about Testosterone supplement.  Please use back brace and/or heating pad for back pain. Please perform simple back exercises as provided.

## 2021-08-29 NOTE — Progress Notes (Signed)
Acute Office Visit  Subjective:    Patient ID: James Blankenship, male    DOB: [birth year], 56 y.o.   MRN: [id number]  Chief Complaint  Patient presents with   Back Pain    Pt has been having back pain dry cough and ankles swelling for about 3 weeks gets worse when working    HPI Patient is in today with multiple different complaints.  B/l leg swelling: He complains of chronic bilateral leg swelling, which is worse at the end of the day.  He has to stand and walk at his work.  His last Echo in 12/21 showed normal LVEF.  Of note, he has history of CKD, but denies any urinary hesitance or resistance currently.  He is followed by Dr. Theador Hawthorne for it.  He states that his leg swelling improves in the morning after rest.  Cough and ear fullness: He complains of chronic cough, nasal congestion, sinus pressure, postnasal drip and b/l ear fullness for 1 month.  He takes Zyrtec for allergies.  Denies any fever, chills, sore throat, dyspnea or wheezing currently.  Erectile dysfunction: He initially had complaint of premature ejaculation, for which he has seen urology.  He was placed on sildenafil initially, but was later given Paxil as needed for it as he did not have erection problem.  He tried taking Paxil, but now he complains of having erection problem.  Denies any dysuria, hematuria, or urethral discharge.  Chronic fatigue: He was on testosterone IM injection twice in a week, but was later changed to once every 2 weeks.  He has been feeling tired since then.  He agrees to discuss it with urology.  Back pain: He complains of chronic, intermittent back pain, which is worse with movement and bending.  He reports that he has to bend frequently at his work.  Denies any heavy lifting or recent fall.  He has tried taking ibuprofen with some relief.  Denies any recent change in sensation of LE.  Past Medical History:  Diagnosis Date   BPH (benign prostatic hyperplasia)    ED (erectile dysfunction)    GERD  (gastroesophageal reflux disease)    Hyperlipidemia    Hypertension    Male hypogonadism 03/23/2019   Nasal septal deviation    Nephrolithiasis    Pneumonia due to COVID-19 virus    Secondary hyperparathyroidism ([place])    Sleep apnea    Stage 3a chronic kidney disease (CKD) ([place])    Vitamin D deficiency disease 03/23/2019    Past Surgical History:  Procedure Laterality Date   BIOPSY  11/01/2015   Procedure: BIOPSY;  Surgeon: Danie Binder, MD;  [facility]: [facility];  Service: Endoscopy;;  gastric   CHOLECYSTECTOMY     COLONOSCOPY WITH PROPOFOL N/A 11/01/2015   Dr. Oneida Alar: internal hemorrhoids, otherwise normal.    ESOPHAGOGASTRODUODENOSCOPY  06/10/2009   Distal esophagea stricture dialted to 28mm. Bx from distal esophagus (neg). erythema in body and antrum.   ESOPHAGOGASTRODUODENOSCOPY  05/30/2009   distal peptic stricture dialted to 45mm, diffuse bx proven gastritis, no .hpylori   ESOPHAGOGASTRODUODENOSCOPY (EGD) WITH PROPOFOL N/A 11/01/2015   Dr. Oneida Alar: benign-appearing esophageal stenosis s/p dilation, chronic gastritis    HERNIA REPAIR Left    groin   SAVORY DILATION N/A 11/01/2015   Procedure: SAVORY DILATION;  Surgeon: Danie Binder, MD;  [facility]: [facility];  Service: Endoscopy;  Laterality: N/A;   VASECTOMY      Family History  Problem Relation Age of Onset  Cancer Father    Cancer - Other Father        mesothelioma, deceased at age 75    Colon cancer Neg Hx     Social History   Socioeconomic History   Marital status: Married    Spouse name: Not on file   Number of children: 2   Years of education: Not on file   Highest education level: Not on file  Occupational History    Employer: [employer]   Occupation: [employer]    Comment: Chiropractor; 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   Social Determinants of Health   Financial Resource Strain: Not on file  Food Insecurity: Not on file  Transportation Needs: Not on file  Physical Activity: Not on file  Stress: Not on file  Social Connections: Not on file  Intimate Partner Violence: Not on file    Outpatient Medications Prior to Visit  Medication Sig Dispense Refill   cetirizine (ZYRTEC) 10 MG tablet Take 1 tablet (10 mg total) by mouth daily. 30 tablet 11   chlorthalidone (HYGROTON) 25 MG tablet Take 12.5 mg by mouth every morning.     cholecalciferol (VITAMIN D3) 25 MCG (1000 UNIT) tablet Take 1,000 Units by mouth daily.     meclizine (ANTIVERT) 25 MG tablet Take 1 tablet (25 mg total) by mouth 3 (three) times daily as needed for dizziness. 30 tablet 0   olmesartan (BENICAR) 20 MG tablet Take 1 tablet (20 mg total) by mouth daily. 90 tablet 3   pantoprazole (PROTONIX) 40 MG tablet TAKE 1 TABLET BY MOUTH EVERY MORNING 30 MINUTES BEFORE BREAKFAST 90 tablet 0   rosuvastatin (CRESTOR) 20 MG tablet Take 1 tablet (20 mg total) by mouth daily. (Patient taking differently: Take 10 mg by mouth daily.) 90 tablet 3   sildenafil (REVATIO) 20 MG tablet Take 3-5 tablets by mouth 30-60 minutes prior to sexual activity 50 tablet 11   Syringe/Needle, Disp, (SYRINGE 3CC/23GX1") 23G X 1" 3 ML MISC 1 each by Does not apply route every 14 (fourteen) days. 50 each 1   testosterone cypionate (DEPOTESTOSTERONE CYPIONATE) 200 MG/ML injection Inject 0.5 mLs (100 mg total) into the muscle every 14 (fourteen) days. 3 mL 1   valACYclovir (VALTREX) 1000 MG tablet Take 1,000 mg by mouth daily.      PARoxetine (PAXIL) 20 MG tablet Take 1 tablet by mouth 3-4 hours prior to  intercourse (Patient not taking: Reported on 08/29/2021) 30 tablet 3   No facility-administered medications prior to visit.    Allergies  Allergen Reactions   Penicillins Swelling    Has patient had a PCN reaction causing immediate rash, facial/tongue/throat swelling, SOB or lightheadedness with hypotension: Yes Has patient had a PCN reaction causing severe rash involving mucus membranes or skin necrosis: No Has patient had a PCN reaction that required hospitalization No Has patient had a PCN reaction occurring within the last 10 years: No If all of the above answers are "NO", then may proceed with Cephalosporin  Review of Systems  Constitutional:  Positive for fatigue. Negative for chills and fever.  HENT:  Positive for congestion, postnasal drip and sinus pressure.   Eyes:  Negative for pain and discharge.  Respiratory:  Positive for cough. Negative for shortness of breath.   Cardiovascular:  Positive for leg swelling. Negative for chest pain and palpitations.  Gastrointestinal:  Negative for diarrhea, nausea and vomiting.  Endocrine: Negative for polydipsia and polyuria.  Genitourinary:  Negative for dysuria and hematuria.  Musculoskeletal:  Positive for back pain. Negative for neck pain and neck stiffness.  Skin:  Negative for rash.  Neurological:  Negative for weakness and numbness.  Psychiatric/Behavioral:  Negative for agitation and behavioral problems.       Objective:    Physical Exam Vitals reviewed.  Constitutional:      General: He is not in acute distress.    Appearance: He is not diaphoretic.  HENT:     Head: Normocephalic and atraumatic.     Nose: Congestion present.     Mouth/Throat:     Mouth: Mucous membranes are moist.  Eyes:     General: No scleral icterus.    Extraocular Movements: Extraocular movements intact.  Cardiovascular:     Rate and Rhythm: Normal rate and regular rhythm.     Pulses: Normal pulses.     Heart sounds: Normal heart sounds. No  murmur heard. Pulmonary:     Breath sounds: Normal breath sounds. No wheezing or rales.  Musculoskeletal:        General: Tenderness (Lower lumbar spine) present.     Cervical back: Neck supple. No tenderness.     Right lower leg: No edema.     Left lower leg: No edema.  Skin:    General: Skin is warm.     Findings: No rash.  Neurological:     General: No focal deficit present.     Mental Status: He is alert and oriented to person, place, and time.  Psychiatric:        Mood and Affect: Mood normal.        Behavior: Behavior normal.    BP 122/84 (BP Location: Left Arm, Patient Position: Sitting, Cuff Size: Normal)    Pulse 87    Resp 18    Ht 6' (1.829 m)    Wt 251 lb 9.6 oz (114.1 kg)    SpO2 98%    BMI 34.12 kg/m  Wt Readings from Last 3 Encounters:  08/29/21 251 lb 9.6 oz (114.1 kg)  07/12/21 242 lb (109.8 kg)  07/04/21 242 lb (109.8 kg)        Assessment & Plan:   Problem List Items Addressed This Visit       Respiratory   Chronic sinusitis - Primary    Likely allergic in etiology Continue Zyrtec and added Flonase for now Advised to perform sinus lavage at home      Relevant Medications   fluticasone (FLONASE) 50 MCG/ACT nasal spray     Nervous and Auditory   Chronic bilateral low back pain with bilateral sciatica    Likely related to his activity Could be DDD of lumbar spine and/or spinal stenosis Advised to perform simple back exercises for now, material provided Heating pad and/or back brace for now Avoid heavy lifting and frequent bending If persistent, will refer to spine surgery        Other   Erectile dysfunction    Advised to use sildenafil as needed Stop Paxil for now Followed  by urology      Testosterone deficiency    His chronic fatigue and erectile dysfunction could be due to testosterone deficiency Advised to discuss dosing of testosterone with urology        Meds ordered this encounter  Medications   fluticasone (FLONASE) 50  MCG/ACT nasal spray    Sig: Place 2 sprays into both nostrils daily.    Dispense:  16 g    Refill:  6     [place] Gipe Keith Rake, MD

## 2021-08-29 NOTE — Telephone Encounter (Signed)
ATC patient to let him know that [facility] will be reaching out to him. No answer and no vm available

## 2021-09-04 ENCOUNTER — Ambulatory Visit: Payer: 59 | Admitting: Internal Medicine

## 2021-09-05 ENCOUNTER — Ambulatory Visit: Payer: 59 | Admitting: Urology

## 2021-09-11 ENCOUNTER — Other Ambulatory Visit: Payer: Self-pay

## 2021-09-11 ENCOUNTER — Encounter: Payer: Self-pay | Admitting: Urology

## 2021-09-11 ENCOUNTER — Ambulatory Visit: Payer: 59 | Admitting: Urology

## 2021-09-11 VITALS — BP 151/81 | HR 92 | Ht 73.0 in | Wt 247.0 lb

## 2021-09-11 DIAGNOSIS — N529 Male erectile dysfunction, unspecified: Secondary | ICD-10-CM | POA: Diagnosis not present

## 2021-09-11 DIAGNOSIS — R351 Nocturia: Secondary | ICD-10-CM | POA: Diagnosis not present

## 2021-09-11 DIAGNOSIS — N281 Cyst of kidney, acquired: Secondary | ICD-10-CM

## 2021-09-11 DIAGNOSIS — F524 Premature ejaculation: Secondary | ICD-10-CM | POA: Diagnosis not present

## 2021-09-11 LAB — URINALYSIS, ROUTINE W REFLEX MICROSCOPIC
Bilirubin, UA: NEGATIVE
Glucose, UA: NEGATIVE
Ketones, UA: NEGATIVE
Leukocytes,UA: NEGATIVE
Nitrite, UA: NEGATIVE
Protein,UA: NEGATIVE
RBC, UA: NEGATIVE
Specific Gravity, UA: 1.015 (ref 1.005–1.030)
Urobilinogen, Ur: 0.2 mg/dL (ref 0.2–1.0)
pH, UA: 6.5 (ref 5.0–7.5)

## 2021-09-11 MED ORDER — PAROXETINE HCL 20 MG PO TABS: ORAL_TABLET | ORAL | 3 refills | Status: DC

## 2021-09-11 NOTE — Progress Notes (Signed)
Assessment: ?1. Premature ejaculation   ?2. Organic impotence   ?3. Bilateral renal cysts   ?4. Nocturia   ? ? ?Plan: ?Recommend trial of Paxil 20 mg 3-4 hours prior to intercourse and sildenafil 3-5 tabs prn. ?Call with results of therapy in 4-6 weeks. ?Return to office in 3 months. ? ?Chief Complaint:  ?Chief Complaint  ?Patient presents with  ? Premature Ejaculation  ? ? ?History of Present Illness: ? ?James Blankenship is a 56 y.o. year old male who is seen for further evaluation of renal cyst, possible renal calculus, and erectile dysfunction.   ?At his initial visit in 9/22, he reported a several month history of left upper abdominal/lower chest pain.  He was evaluated with a CT scan without contrast on 12/18/2020.  This study showed mild bilateral perinephric stranding, no evidence of obstruction, and no renal or ureteral calculi.  He was e valuated with a renal ultrasound on 02/21/2021 which showed small bilateral simple renal cysts and a 11 mm shadowing echogenic focus in the lower pole of the left kidney without evidence of hydronephrosis.  No prior history of kidney stones.  He reported some discomfort in the left lower chest area associated with coughing, sneezing, and movements.  No flank pain.  He reported some urinary frequency and nocturia 2-4 times per night.  No dysuria or gross hematuria. ? ?He also has a history of erectile dysfunction with difficulty achieving and maintaining his erections.  No pain or curvature.  No decrease in his libido.  He had not tried any medical therapy for erectile dysfunction.  He does not take nitrates.  He is currently on testosterone replacement therapy receiving testosterone injections 100 mg every 2 weeks.  This has been managed by his PCP. ? ?PSA from 5/22: 0.9 ? ?He was given a trial of sildenafil 20 mg tablets 2-5 as needed intercourse at his visit in 9/22. ? ?At his visit in 12/22, he reported improvement in his erectile dysfunction.  He was not having any  difficulty achieving erections.  He did report problems with premature ejaculation, present for a number of years.  He reported ejaculation in <5 minutes after vaginal penetration.  He was otherwise able to maintain his erection.  No pain with ejaculation. ?He noted some occasional frequency and nocturia.  No dysuria or gross hematuria. ?AUA score = 12. ?He was started on Paxil 20 mg as needed intercourse. ? ?He returns today for follow-up.  He tried taking the Paxil but had difficulty achieving an erection.  He resumed the sildenafil using 4 tablets as needed.  This improved his erections.  He did not resume the Paxil so he continued to have problems with premature ejaculation.  He has not taken both medications together. ?His urinary symptoms remain stable.  He continues to have frequency, nocturia x3, decreased force of stream, and sensation of incomplete emptying.  No dysuria or gross hematuria. ?IPSS = 10 today. ?Portions of the above documentation were copied from a prior visit for review purposes only. ? ? ?Past Medical History:  ?Past Medical History:  ?Diagnosis Date  ? BPH (benign prostatic hyperplasia)   ? ED (erectile dysfunction)   ? GERD (gastroesophageal reflux disease)   ? Hyperlipidemia   ? Hypertension   ? Male hypogonadism 03/23/2019  ? Nasal septal deviation   ? Nephrolithiasis   ? Pneumonia due to COVID-19 virus   ? Secondary hyperparathyroidism ([place])   ? Sleep apnea   ? Stage 3a chronic kidney disease (  CKD) ([place])   ? Vitamin D deficiency disease 03/23/2019  ? ? ?Past Surgical History:  ?Past Surgical History:  ?Procedure Laterality Date  ? BIOPSY  11/01/2015  ? Procedure: BIOPSY;  Surgeon: Danie Binder, MD;  [facility];  Service: Endoscopy;;  gastric  ? CHOLECYSTECTOMY    ? COLONOSCOPY WITH PROPOFOL N/A 11/01/2015  ? Dr. Oneida Alar: internal hemorrhoids, otherwise normal.   ? ESOPHAGOGASTRODUODENOSCOPY  06/10/2009  ? Distal esophagea stricture dialted to 71m. Bx from distal esophagus  (neg). erythema in body and antrum.  ? ESOPHAGOGASTRODUODENOSCOPY  05/30/2009  ? distal peptic stricture dialted to 127m diffuse bx proven gastritis, no .hpylori  ? ESOPHAGOGASTRODUODENOSCOPY (EGD) WITH PROPOFOL N/A 11/01/2015  ? Dr. FiOneida Alarbenign-appearing esophageal stenosis s/p dilation, chronic gastritis   ? HERNIA REPAIR Left   ? groin  ? SAVORY DILATION N/A 11/01/2015  ? Procedure: SAVORY DILATION;  Surgeon: SaDanie BinderMD;  [facility];  Service: Endoscopy;  Laterality: N/A;  ? VASECTOMY    ? ? ?Allergies:  ?Allergies  ?Allergen Reactions  ? Penicillins Swelling  ?  Has patient had a PCN reaction causing immediate rash, facial/tongue/throat swelling, SOB or lightheadedness with hypotension: Yes ?Has patient had a PCN reaction causing severe rash involving mucus membranes or skin necrosis: No ?Has patient had a PCN reaction that required hospitalization No ?Has patient had a PCN reaction occurring within the last 10 years: No ?If all of the above answers are "NO", then may proceed with Cephalosporin  ? ? ?Family History:  ?Family History  ?Problem Relation Age of Onset  ? Cancer Father   ? Cancer - Other Father   ?     mesothelioma, deceased at age 571 ? Colon cancer Neg Hx   ? ? ?Social History:  ?Social History  ? ?Tobacco Use  ? Smoking status: Never  ? Smokeless tobacco: Never  ?Vaping Use  ? Vaping Use: Never used  ?Substance Use Topics  ? Alcohol use: No  ?  Alcohol/week: 0.0 standard drinks  ? Drug use: No  ? ? ?ROS: ?Constitutional:  Negative for fever, chills, weight loss ?CV: Negative for chest pain, previous MI, hypertension ?Respiratory:  Negative for shortness of breath, wheezing, sleep apnea, frequent cough ?GI:  Negative for nausea, vomiting, bloody stool, GERD ? ?Physical exam: ?BP (!) 151/81   Pulse 92   Ht '6\' 1"'$  (1.854 m)   Wt 247 lb (112 kg)   BMI 32.59 kg/m?  ?GENERAL APPEARANCE:  Well appearing, well developed, well nourished, NAD ?HEENT:  Atraumatic, normocephalic,  oropharynx clear ?NECK:  Supple without lymphadenopathy or thyromegaly ?ABDOMEN:  Soft, non-tender, no masses ?EXTREMITIES:  Moves all extremities well, without clubbing, cyanosis, or edema ?NEUROLOGIC:  Alert and oriented x 3, normal gait, CN II-XII grossly intact ?MENTAL STATUS:  appropriate ?BACK:  Non-tender to palpation, No CVAT ?SKIN:  Warm, dry, and intact ? ? ?Results: ?U/A dipstick negative ?

## 2021-09-14 ENCOUNTER — Telehealth: Payer: Self-pay | Admitting: Pulmonary Disease

## 2021-09-14 NOTE — Telephone Encounter (Signed)
Community message sent to [facility] team again to help see if this issue can be resolved. ATC patient to notify. Left detailed VM on pts cell phone (ok per dpr) letting him know I have asked [facility] to contact him.  ?Nothing further needed ?

## 2021-09-16 IMAGING — US US RENAL
1 series · 14 of 25 positions shown · non-contrast
Comparison: March 04, 2006

CLINICAL DATA: Stage III A chronic renal disease.

EXAM:
RENAL / URINARY TRACT ULTRASOUND COMPLETE

[Series 1: us renal · 14 of 62 slices shown]
[im 1/62]
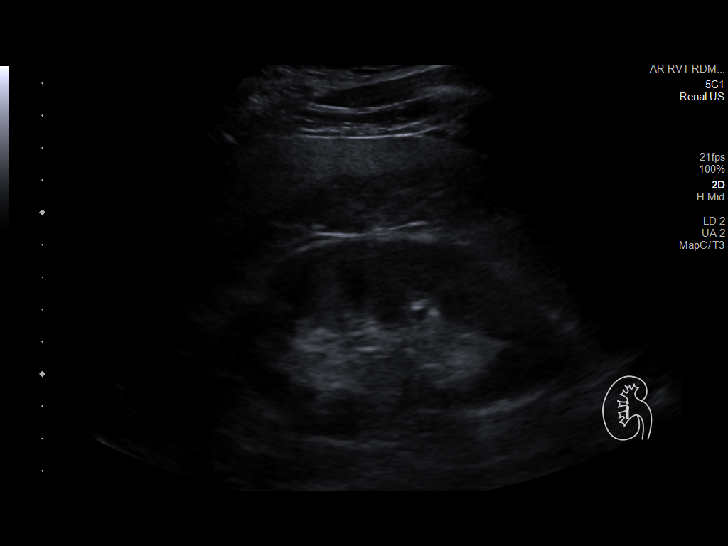
[im 6/62]
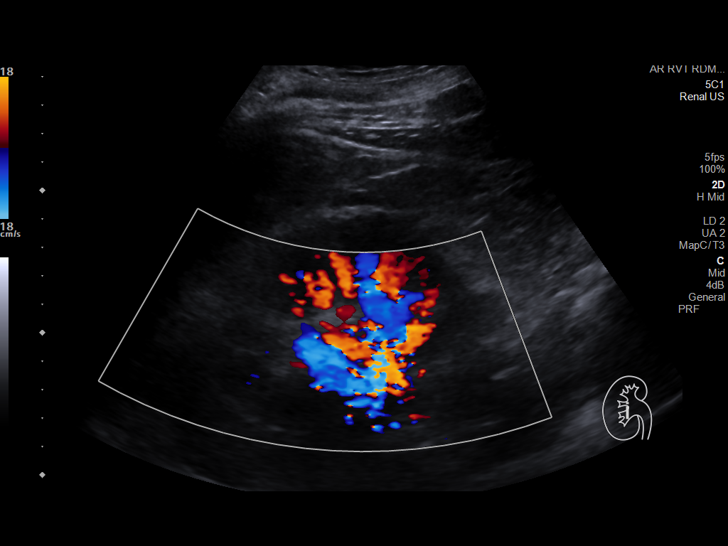
[im 11/62]
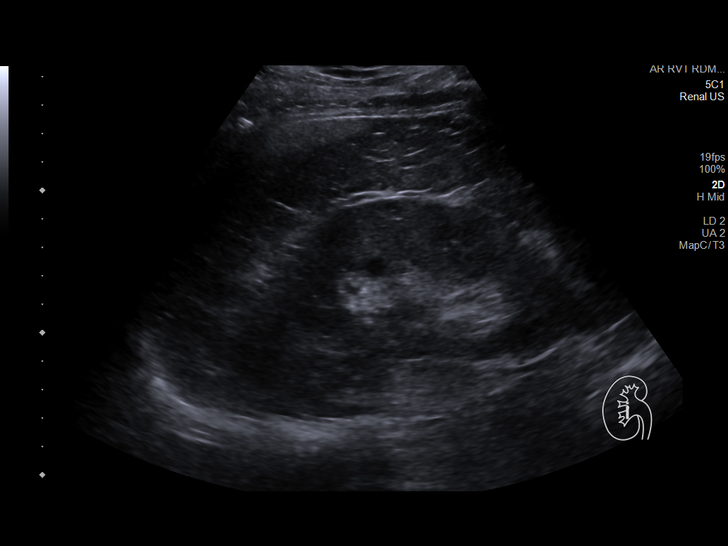
[im 16/62]
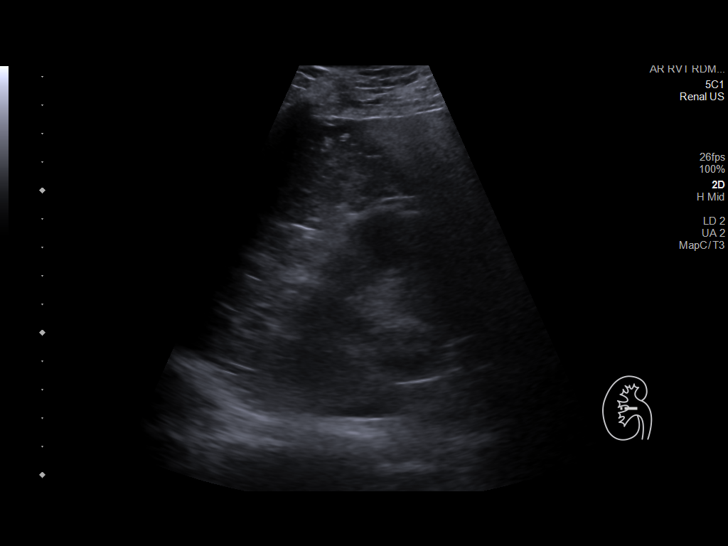
[im 21/62]
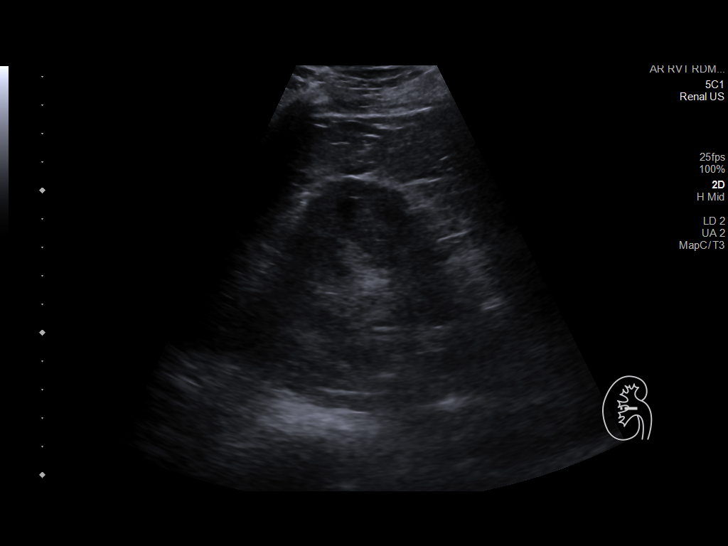
[im 23/62]
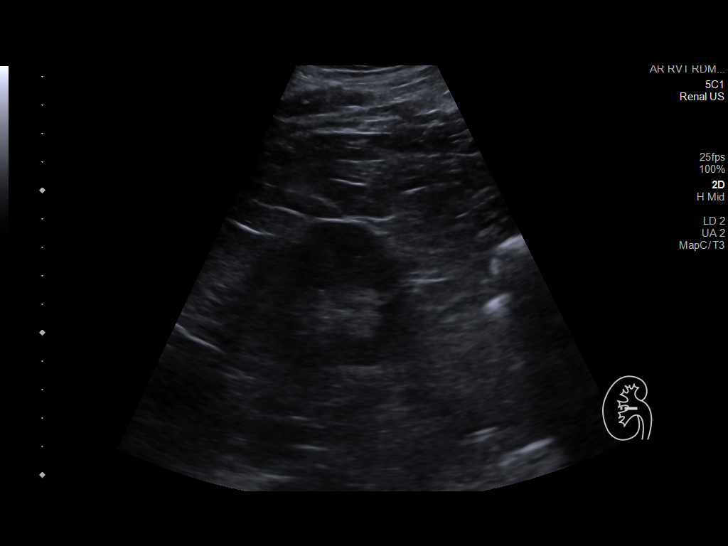
[im 28/62]
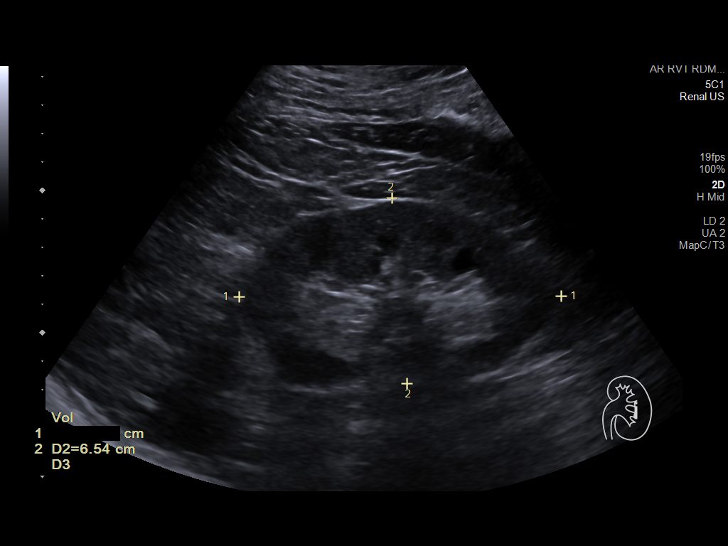
[im 34/62]
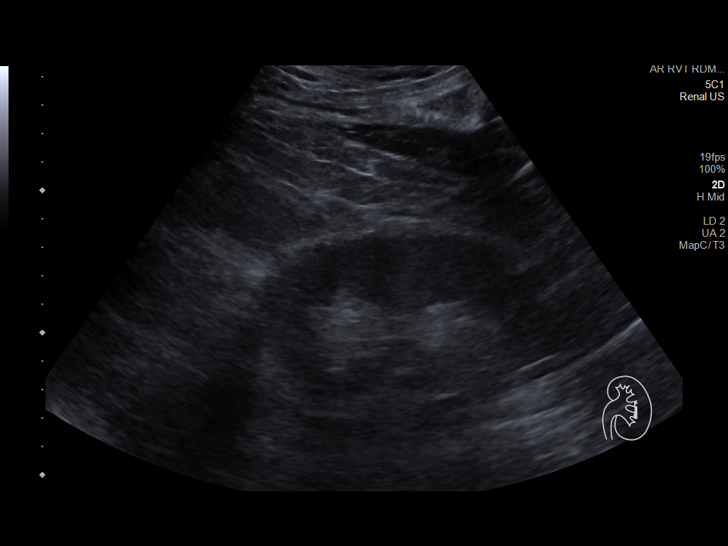
[im 39/62]
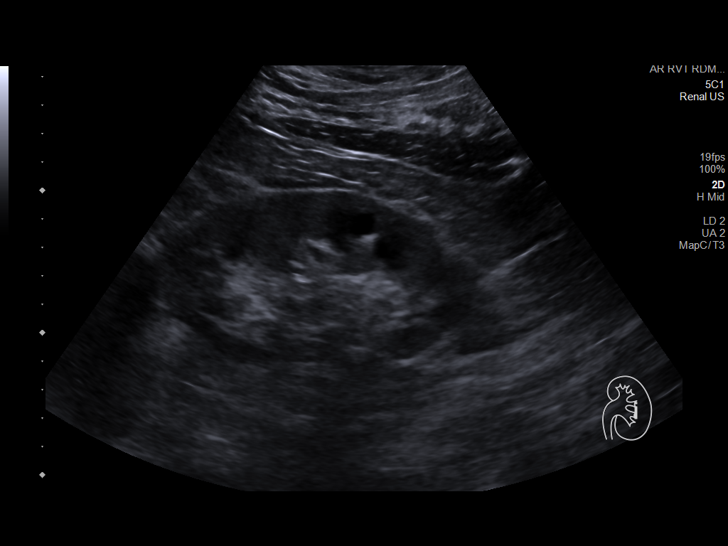
[im 41/62]
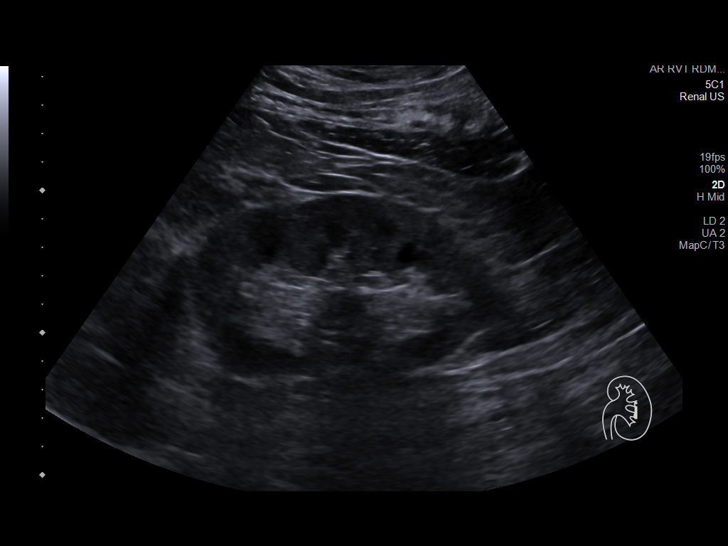
[im 46/62]
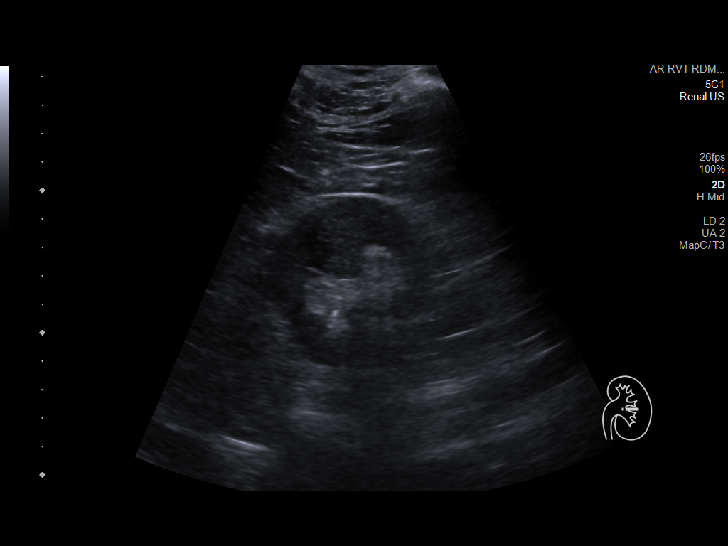
[im 51/62]
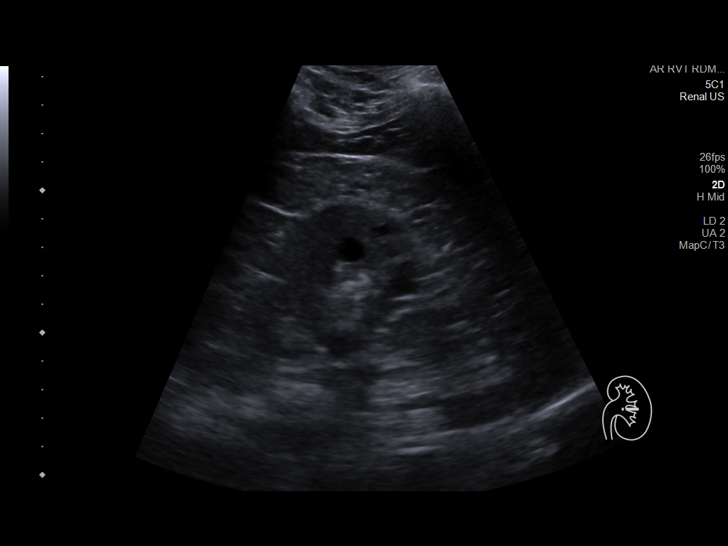
[im 56/62]
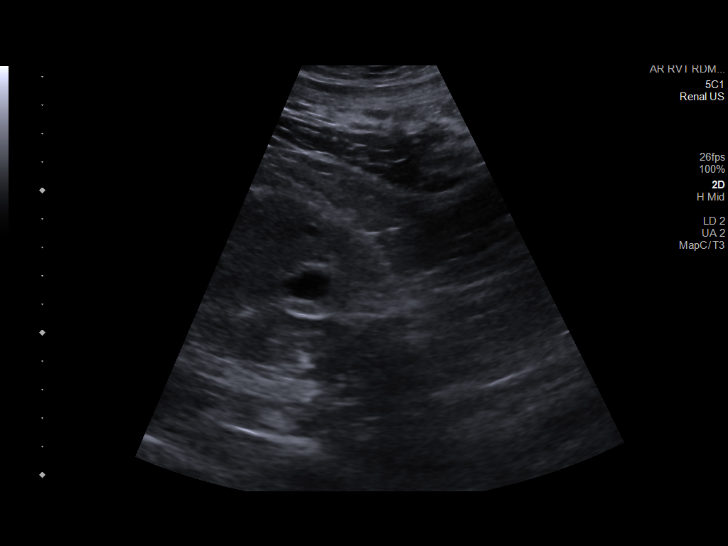
[im 62/62]
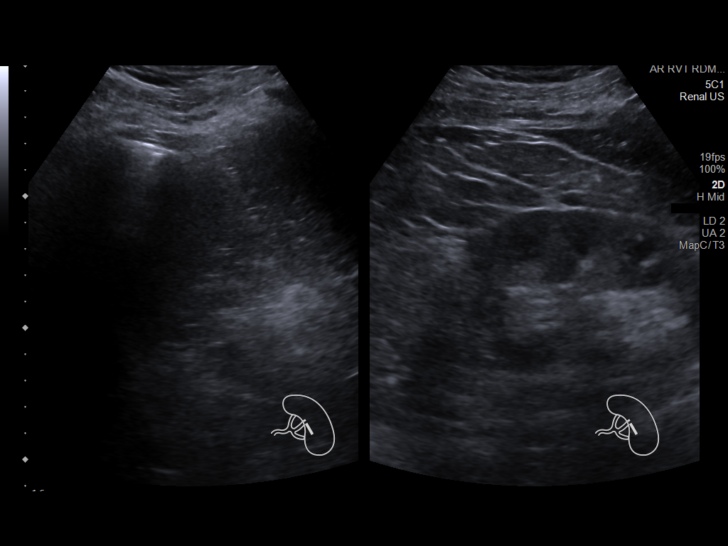

[14 of 25 positions shown; findings below may reference images not displayed]

FINDINGS: Right Kidney:

Renal measurements: 11.8 cm x 6.8 cm x 6.8 cm = volume: 287.37 mL.
Echogenicity within normal limits. A 0.7 cm x 0.9 cm x 0.9 cm
anechoic structure is seen within the lateral aspect of the mid
right kidney. No abnormal flow is noted within this region on color
Doppler evaluation. No hydronephrosis is visualized.

Left Kidney:

Renal measurements: 11.3 cm x 6.5 cm x 5.8 cm = volume: 226.38 mL.
Echogenicity within normal limits. 1.2 cm x 1.1 cm x 1.3 cm and
cm x 1.3 cm x 1.2 cm anechoic structures are seen within the mid and
lower left kidney. No abnormal flow is noted within these regions on
color Doppler evaluation. An 11 mm shadowing echogenic focus is seen
within the lower pole of the left kidney. No hydronephrosis is
visualized.

Bladder:

Appears normal for degree of bladder distention. The bilateral
ureteral jets are visualized.

Other:

None.
IMPRESSION: 1. 11 mm nonobstructing renal calculus within the RIGHT kidney.
2. Small, bilateral simple renal cysts.

## 2021-09-19 ENCOUNTER — Other Ambulatory Visit (HOSPITAL_COMMUNITY): Payer: Self-pay | Admitting: Adult Health Nurse Practitioner

## 2021-09-19 DIAGNOSIS — R918 Other nonspecific abnormal finding of lung field: Secondary | ICD-10-CM

## 2021-09-19 DIAGNOSIS — R0602 Shortness of breath: Secondary | ICD-10-CM

## 2021-09-24 ENCOUNTER — Other Ambulatory Visit: Payer: Self-pay | Admitting: Nurse Practitioner

## 2021-09-28 ENCOUNTER — Ambulatory Visit: Payer: 59 | Admitting: Internal Medicine

## 2021-10-18 ENCOUNTER — Emergency Department (HOSPITAL_COMMUNITY): Payer: 59

## 2021-10-18 ENCOUNTER — Emergency Department (HOSPITAL_COMMUNITY)
Admission: EM | Admit: 2021-10-18 | Discharge: 2021-10-18 | Disposition: A | Discharge status: Home / Self Care | Payer: 59 | Attending: Emergency Medicine | Admitting: Emergency Medicine

## 2021-10-18 DIAGNOSIS — I10 Essential (primary) hypertension: Secondary | ICD-10-CM | POA: Diagnosis not present

## 2021-10-18 DIAGNOSIS — Z79899 Other long term (current) drug therapy: Secondary | ICD-10-CM | POA: Diagnosis not present

## 2021-10-18 DIAGNOSIS — R079 Chest pain, unspecified: Secondary | ICD-10-CM | POA: Diagnosis present

## 2021-10-18 DIAGNOSIS — R0789 Other chest pain: Secondary | ICD-10-CM | POA: Diagnosis not present

## 2021-10-18 DIAGNOSIS — R0689 Other abnormalities of breathing: Secondary | ICD-10-CM | POA: Diagnosis not present

## 2021-10-18 LAB — CBC
HCT: 44.2 % (ref 39.0–52.0)
Hemoglobin: 14.8 g/dL (ref 13.0–17.0)
MCH: 31.2 pg (ref 26.0–34.0)
MCHC: 33.5 g/dL (ref 30.0–36.0)
MCV: 93.1 fL (ref 80.0–100.0)
Platelets: 233 10*3/uL (ref 150–400)
RBC: 4.75 MIL/uL (ref 4.22–5.81)
RDW: 13.5 % (ref 11.5–15.5)
WBC: 7.1 10*3/uL (ref 4.0–10.5)
nRBC: 0 % (ref 0.0–0.2)

## 2021-10-18 LAB — BRAIN NATRIURETIC PEPTIDE: B Natriuretic Peptide: 4.5 pg/mL (ref 0.0–100.0)

## 2021-10-18 LAB — BASIC METABOLIC PANEL
Anion gap: 9 (ref 5–15)
BUN: 30 mg/dL — ABNORMAL HIGH (ref 6–20)
CO2: 26 mmol/L (ref 22–32)
Calcium: 9.5 mg/dL (ref 8.9–10.3)
Chloride: 103 mmol/L (ref 98–111)
Creatinine, Ser: 1.87 mg/dL — ABNORMAL HIGH (ref 0.61–1.24)
GFR, Estimated: 42 mL/min — ABNORMAL LOW (ref >60)
Glucose, Bld: 98 mg/dL (ref 70–99)
Potassium: 4 mmol/L (ref 3.5–5.1)
Sodium: 138 mmol/L (ref 135–145)

## 2021-10-18 LAB — TROPONIN I (HIGH SENSITIVITY)
Troponin I (High Sensitivity): 4 ng/L (ref <18)
Troponin I (High Sensitivity): 4 ng/L (ref <18)

## 2021-10-18 MED ORDER — ASPIRIN 81 MG PO CHEW
324.0000 mg | CHEWABLE_TABLET | Freq: Once | ORAL | Status: AC
  Administered 2021-10-18: 324 mg via ORAL
  Filled 2021-10-18: qty 4

## 2021-10-18 MED ORDER — NITROGLYCERIN 0.4 MG SL SUBL
0.4000 mg | SUBLINGUAL_TABLET | SUBLINGUAL | Status: DC | PRN
  Administered 2021-10-18: 0.4 mg via SUBLINGUAL
  Filled 2021-10-18: qty 1

## 2021-10-18 NOTE — ED Notes (Signed)
Patient verbalizes understanding of d/c instructions. Opportunities for questions and answers were provided. Pt d/c from ED and ambulated to lobby with wife.  

## 2021-10-18 NOTE — ED Provider Triage Note (Signed)
Emergency Medicine Provider Triage Evaluation Note ? ?James Blankenship , a 56 y.o. male  was evaluated in triage.  Pt complains of cp. ? ?Review of Systems  ?Positive: Cp, dizziness, leg swelling ?Negative: Fever, n/v ? ?Physical Exam  ?BP 134/80 (BP Location: Right Arm)   Pulse (!) 106   Temp 97.6 ?F (36.4 ?C)   Resp 18   SpO2 97%  ?Gen:   Awake, no distress   ?Resp:  Normal effort  ?MSK:   Moves extremities without difficulty  ?Other:   ? ?Medical Decision Making  ?Medically screening exam initiated at 3:10 PM.  Appropriate orders placed.  James Blankenship was informed that the remainder of the evaluation will be completed by another provider, this initial triage assessment does not replace that evaluation, and the importance of remaining in the ED until their evaluation is complete. ? ?Pt here with intermittent chest pain, sob, and dizziness for the past several days.  Has leg swelling bilaterally for 2-3 weeks, improving with lasix.   ?  ?Domenic Moras, PA-C ?10/18/21 1515 ? ?

## 2021-10-18 NOTE — ED Triage Notes (Signed)
Pt c/o generalized CP x2 days, worse w exertion, associated intermittent dizziness. Pt reports swelling to bilateral lower extremities x "a couple weeks," states PCP put him on lasix approx 1wk ago, endorses improvement in swelling.  ?

## 2021-10-18 NOTE — ED Provider Notes (Signed)
?[facility] ?Provider Note ? ? ?CSN: [id number] ?Arrival date & time: 10/18/21  1313 ? ?  ? ?History ? ?Chief Complaint  ?Patient presents with  ? Chest Pain  ? ? ?James Blankenship is a 56 y.o. male with a PMHx of hypertension, hyperlipidemia, who presents to the Emergency Department complaining of intermittent right sided and sternal CP onset 2 days ago.  Describes this chest pain as a pressure sensation.  Pt notes that his chest pain is exertional and exacerbated when he turns his upper body.  He denies chest pain at rest. Pt has not tried any medications for his symptoms. Denies shortness of breath, cough, abdominal pain, nausea, vomiting. He notes that he was started on lasix 1 week ago with improvement of his leg swelling. Denies prior MI, CAD, stents, or cardiac cath. Denies recent heavy lifting, injury, trauma, fall. ? ? ?The history is provided by the patient and the spouse. No language interpreter was used.  ? ?  ? ?Home Medications ?Prior to Admission medications   ?Medication Sig Start Date End Date Taking? Authorizing Provider  ?cetirizine (ZYRTEC) 10 MG tablet Take 1 tablet (10 mg total) by mouth daily. 03/23/21   Noreene Larsson, NP  ?chlorthalidone (HYGROTON) 25 MG tablet Take 12.5 mg by mouth every morning. 06/25/21   [provider]  ?Cholecalciferol (VITAMIN D3) 1.25 MG (50000 UT) CAPS Take 1 capsule by mouth once a week. 09/13/21   [provider]  ?cholecalciferol (VITAMIN D3) 25 MCG (1000 UNIT) tablet Take 1,000 Units by mouth daily.    [provider]  ?fluticasone (FLONASE) 50 MCG/ACT nasal spray Place 2 sprays into both nostrils daily. 08/29/21   Lindell Spar, MD  ?furosemide (LASIX) 20 MG tablet Take 20 mg by mouth daily. 10/11/21   [provider]  ?meclizine (ANTIVERT) 25 MG tablet Take 1 tablet (25 mg total) by mouth 3 (three) times daily as needed for dizziness. 12/12/20   Faustino Congress, NP  ?olmesartan (BENICAR) 20 MG  tablet Take 1 tablet (20 mg total) by mouth daily. 06/29/21   Noreene Larsson, NP  ?pantoprazole (PROTONIX) 40 MG tablet TAKE 1 TABLET BY MOUTH EVERY MORNING 30 MINUTES BEFORE BREAKFAST 09/25/21   Renee Rival, FNP  ?PARoxetine (PAXIL) 20 MG tablet Take 1 tablet by mouth 3-4 hours prior to intercourse. 09/11/21   Stoneking, Reece Leader., MD  ?phentermine 37.5 MG capsule Take 37.5 mg by mouth every morning. 10/11/21   [provider]  ?rosuvastatin (CRESTOR) 20 MG tablet Take 1 tablet (20 mg total) by mouth daily. ?Patient taking differently: Take 10 mg by mouth daily. 03/23/21   Noreene Larsson, NP  ?sildenafil (REVATIO) 20 MG tablet Take 3-5 tablets by mouth 30-60 minutes prior to sexual activity 03/30/21   Stoneking, Reece Leader., MD  ?Syringe/Needle, Disp, (SYRINGE 3CC/23GX1") 23G X 1" 3 ML MISC 1 each by Does not apply route every 14 (fourteen) days. 03/23/21   Noreene Larsson, NP  ?testosterone cypionate (DEPOTESTOSTERONE CYPIONATE) 200 MG/ML injection Inject 0.5 mLs (100 mg total) into the muscle every 14 (fourteen) days. 03/23/21   Noreene Larsson, NP  ?valACYclovir (VALTREX) 1000 MG tablet Take 1,000 mg by mouth daily.  03/30/20   [provider]  ?   ? ?Allergies    ?Penicillins   ? ?Review of Systems   ?Review of Systems  ?Respiratory:  Negative for cough and shortness of breath.   ?Cardiovascular:  Positive for  chest pain. Negative for leg swelling.  ?Gastrointestinal:  Negative for abdominal pain, nausea and vomiting.  ?All other systems reviewed and are negative. ? ?Physical Exam ?Updated Vital Signs ?BP (!) 160/84 (BP Location: Right Arm)   Pulse 67   Temp 97.6 ?F (36.4 ?C)   Resp 16   SpO2 99%  ?Physical Exam ?Vitals and nursing note reviewed.  ?Constitutional:   ?   General: He is not in acute distress. ?   Appearance: He is not diaphoretic.  ?HENT:  ?   Head: Normocephalic and atraumatic.  ?   Mouth/Throat:  ?   Pharynx: No oropharyngeal exudate.  ?Eyes:  ?   General: No scleral  icterus. ?   Conjunctiva/sclera: Conjunctivae normal.  ?Cardiovascular:  ?   Rate and Rhythm: Normal rate and regular rhythm.  ?   Pulses: Normal pulses.  ?   Heart sounds: Normal heart sounds.  ?Pulmonary:  ?   Effort: Pulmonary effort is normal. No respiratory distress.  ?   Breath sounds: Normal breath sounds. No wheezing.  ?Chest:  ?   Chest wall: No tenderness.  ?   Comments: No chest wall tenderness to palpation. ?Abdominal:  ?   General: Bowel sounds are normal.  ?   Palpations: Abdomen is soft. There is no mass.  ?   Tenderness: There is no abdominal tenderness. There is no guarding or rebound.  ?Musculoskeletal:     ?   General: Normal range of motion.  ?   Cervical back: Normal range of motion and neck supple.  ?   Comments: No lower extremity swelling noted bilaterally.  ?Skin: ?   General: Skin is warm and dry.  ?Neurological:  ?   Mental Status: He is alert.  ?Psychiatric:     ?   Behavior: Behavior normal.  ? ? ?ED Results / Procedures / Treatments   ?Labs ?(all labs ordered are listed, but only abnormal results are displayed) ?Labs Reviewed  ?BASIC METABOLIC PANEL - Abnormal; Notable for the following components:  ?    Result Value  ? BUN 30 (*)   ? Creatinine, Ser 1.87 (*)   ? GFR, Estimated 42 (*)   ? All other components within normal limits  ?CBC  ?BRAIN NATRIURETIC PEPTIDE  ?TROPONIN I (HIGH SENSITIVITY)  ?TROPONIN I (HIGH SENSITIVITY)  ? ? ?EKG ?None ? ?Radiology ?DG Chest 2 View ? ?Result Date: 10/18/2021 ?CLINICAL DATA:  Chest pain EXAM: CHEST - 2 VIEW COMPARISON:  June 2022 FINDINGS: The cardiomediastinal silhouette is within normal limits. No pleural effusion. No pneumothorax. No mass or consolidation. No acute osseous abnormality. IMPRESSION: No acute findings in the chest. Electronically Signed   By: Albin Felling M.D.   On: 10/18/2021 15:55   ? ?Procedures ?Procedures  ? ? ?Medications Ordered in ED ?Medications  ?nitroGLYCERIN (NITROSTAT) SL tablet 0.4 mg (0.4 mg Sublingual Given 10/18/21  1518)  ?aspirin chewable tablet 324 mg (324 mg Oral Given 10/18/21 1517)  ? ? ?ED Course/ Medical Decision Making/ A&P ?Clinical Course as of 10/18/21 2323  ?Wed Oct 18, 2021  ?2204 Discussed discharge treatment plan with patient at bedside.  Patient agreeable at this time. [SB]  ?  ?Clinical Course User Index ?[SB] Sydna Brodowski A, PA-C  ? ?                        ?Medical Decision Making ?Amount and/or Complexity of Data Reviewed ?Labs: ordered. ?Radiology: ordered. ? ? ?Patient  presents to the ED with intermittent, right-sided-sternal chest pain x 2 days.  He describes the chest pain as a pressure sensation.  Denies recent injury, trauma, fall.  No prior history of MI, catheterization, stents.  Vital signs stable, patient afebrile, not tachycardic or hypoxic.  On exam patient without acute cardiovascular, respiratory, downtime findings.  No lower extremity swelling appreciated on exam.  Differential diagnosis includes ACS, pneumothorax, pneumonia, CHF. ?  ?Additional history obtained:  ?External records from outside source obtained and reviewed including: Review of previous annual physical exam with his primary care provider on 10/11/2021.  Previous echocardiogram on 04/13/2020 was notable for left ventricular ejection fraction of 55-60%. ? ?EKG:  ?Normal sinus rhythm.  No acute ST/T changes. ? ?Labs:  ?I ordered, and personally interpreted labs.  The pertinent results include:   ?Initial and delta troponin at 4. ?CBC unremarkable ?BMP notable for AKI (Creatinine elevated at 1.87, BUN mildly elevated at 30) otherwise unremarkable. ?BNP unremarkable at 4.5 ? ?Imaging: ?I ordered imaging studies including CXR ?I independently visualized and interpreted imaging which showed: No acute cardiopulmonary findings ?I agree with the radiologist interpretation ? ?Medications:  ?Prior to the patient being evaluated by myself he was given nitroglycerin x2 and aspirin while in triage.  Once I took over care of patient, review of  patient medications and noted that patient is on sildenafil.  Patient without chest pain or shortness of breath at this time. ?Reevaluation of the patient after medications given in triage patient noted that his

## 2021-10-18 NOTE — ED Notes (Signed)
No answer for labs x2 ?

## 2021-10-18 NOTE — Discharge Instructions (Addendum)
It was a pleasure taking care of you today!  ? ?Your workup was negative in the ED. You may apply ice or heat to the affected area for 15 minutes at a time.  Ensure to place a barrier between your skin and and the ice.  You may use over-the-counter 1,000 mg Tylenol every 6 hours or 600 mg ibuprofen every 6 hours as needed for pain.  Follow-up with your primary care provider for evaluation of your symptoms.  Call your cardiologist tomorrow to set up a follow-up appointment regarding today's ED visit.  You may return to the ED if you are experiencing increasing/worsening chest pain, shortness of breath, or worsening symptoms. ?

## 2021-10-31 ENCOUNTER — Ambulatory Visit (HOSPITAL_COMMUNITY)
Admission: RE | Admit: 2021-10-31 | Discharge: 2021-10-31 | Disposition: A | Discharge status: Home / Self Care | Payer: 59 | Source: Ambulatory Visit | Attending: Adult Health Nurse Practitioner | Admitting: Adult Health Nurse Practitioner

## 2021-10-31 DIAGNOSIS — R0602 Shortness of breath: Secondary | ICD-10-CM | POA: Diagnosis present

## 2021-10-31 DIAGNOSIS — R918 Other nonspecific abnormal finding of lung field: Secondary | ICD-10-CM

## 2021-11-08 ENCOUNTER — Other Ambulatory Visit: Payer: Self-pay | Admitting: Urology

## 2021-11-08 DIAGNOSIS — F524 Premature ejaculation: Secondary | ICD-10-CM

## 2021-12-18 ENCOUNTER — Ambulatory Visit: Payer: 59 | Admitting: Urology

## 2021-12-19 ENCOUNTER — Telehealth: Payer: Self-pay | Admitting: Pulmonary Disease

## 2021-12-20 NOTE — Telephone Encounter (Signed)
For some reason, it looks like his PCP referred him to see pulmonary with [facility] in [place] in March.  He had a repeat sleep study ordered and had CT chest ordered.  He doesn't need to have need to have another CT chest in August 2023.    Please check with him if he plans continue follow up with [facility] or if he is going to follow up with Dr. Michela Pitcher with [facility] in [place].  He doesn't need to see both of [place].

## 2021-12-20 NOTE — Telephone Encounter (Signed)
ATC patient LMTCB.   Will route to [facility] so they are aware he does not need another Chest CT in August.

## 2021-12-20 NOTE — Telephone Encounter (Signed)
I have a order for ct chest wo for Aug 2023 but just had a ct in 10/31/2021 will they still need this one   Dr. Halford Chessman please advise

## 2021-12-20 NOTE — Telephone Encounter (Signed)
Noted I have cancelled the ct for Aug

## 2021-12-26 ENCOUNTER — Ambulatory Visit: Payer: 59 | Admitting: Urology

## 2021-12-26 NOTE — Progress Notes (Deleted)
Assessment: 1. Premature ejaculation   2. Organic impotence   3. Bilateral renal cysts      Plan: Recommend trial of Paxil 20 mg 3-4 hours prior to intercourse and sildenafil 3-5 tabs prn. Call with results of therapy in 4-6 weeks. Return to office in 3 months.  Chief Complaint:  No chief complaint on file.   History of Present Illness:  James Blankenship is a 56 y.o. year old male who is seen for further evaluation of renal cyst, possible renal calculus, and erectile dysfunction.   At his initial visit in 9/22, he reported a several month history of left upper abdominal/lower chest pain.  He was evaluated with a CT scan without contrast on 12/18/2020.  This study showed mild bilateral perinephric stranding, no evidence of obstruction, and no renal or ureteral calculi.  He was e valuated with a renal ultrasound on 02/21/2021 which showed small bilateral simple renal cysts and a 11 mm shadowing echogenic focus in the lower pole of the left kidney without evidence of hydronephrosis.  No prior history of kidney stones.  He reported some discomfort in the left lower chest area associated with coughing, sneezing, and movements.  No flank pain.  He reported some urinary frequency and nocturia 2-4 times per night.  No dysuria or gross hematuria.  He also has a history of erectile dysfunction with difficulty achieving and maintaining his erections.  No pain or curvature.  No decrease in his libido.  He had not tried any medical therapy for erectile dysfunction.  He does not take nitrates.  He is currently on testosterone replacement therapy receiving testosterone injections 100 mg every 2 weeks.  This has been managed by his PCP.  PSA from 5/22: 0.9  He was given a trial of sildenafil 20 mg tablets 2-5 as needed intercourse at his visit in 9/22.  At his visit in 12/22, he reported improvement in his erectile dysfunction.  He was not having any difficulty achieving erections.  He did report problems with  premature ejaculation, present for a number of years.  He reported ejaculation in <5 minutes after vaginal penetration.  He was otherwise able to maintain his erection.  No pain with ejaculation. He noted some occasional frequency and nocturia.  No dysuria or gross hematuria. AUA score = 12. He was started on Paxil 20 mg as needed intercourse.  He returns today for follow-up.  He tried taking the Paxil but had difficulty achieving an erection.  He resumed the sildenafil using 4 tablets as needed.  This improved his erections.  He did not resume the Paxil so he continued to have problems with premature ejaculation.  He has not taken both medications together. His urinary symptoms remain stable.  He continues to have frequency, nocturia x3, decreased force of stream, and sensation of incomplete emptying.  No dysuria or gross hematuria. IPSS = 10.   Portions of the above documentation were copied from a prior visit for review purposes only.   Past Medical History:  Past Medical History:  Diagnosis Date   BPH (benign prostatic hyperplasia)    ED (erectile dysfunction)    GERD (gastroesophageal reflux disease)    Hyperlipidemia    Hypertension    Male hypogonadism 03/23/2019   Nasal septal deviation    Nephrolithiasis    Pneumonia due to COVID-19 virus    Secondary hyperparathyroidism ([place])    Sleep apnea    Stage 3a chronic kidney disease (CKD) (HCC)    Vitamin D deficiency  disease 03/23/2019    Past Surgical History:  Past Surgical History:  Procedure Laterality Date   BIOPSY  11/01/2015   Procedure: BIOPSY;  Surgeon: Danie Binder, MD;  Location: [facility];  Service: Endoscopy;;  gastric   CHOLECYSTECTOMY     COLONOSCOPY WITH PROPOFOL N/A 11/01/2015   Dr. Oneida Alar: internal hemorrhoids, otherwise normal.    ESOPHAGOGASTRODUODENOSCOPY  06/10/2009   Distal esophagea stricture dialted to 19m. Bx from distal esophagus (neg). erythema in body and antrum.    ESOPHAGOGASTRODUODENOSCOPY  05/30/2009   distal peptic stricture dialted to 172m diffuse bx proven gastritis, no .hpylori   ESOPHAGOGASTRODUODENOSCOPY (EGD) WITH PROPOFOL N/A 11/01/2015   Dr. FiOneida Alarbenign-appearing esophageal stenosis s/p dilation, chronic gastritis    HERNIA REPAIR Left    groin   SAVORY DILATION N/A 11/01/2015   Procedure: SAVORY DILATION;  Surgeon: SaDanie BinderMD;  Location: [facility];  Service: Endoscopy;  Laterality: N/A;   VASECTOMY      Allergies:  Allergies  Allergen Reactions   Penicillins Swelling    Has patient had a PCN reaction causing immediate rash, facial/tongue/throat swelling, SOB or lightheadedness with hypotension: Yes Has patient had a PCN reaction causing severe rash involving mucus membranes or skin necrosis: No Has patient had a PCN reaction that required hospitalization No Has patient had a PCN reaction occurring within the last 10 years: No If all of the above answers are "NO", then may proceed with Cephalosporin    Family History:  Family History  Problem Relation Age of Onset   Cancer Father    Cancer - Other Father        mesothelioma, deceased at age 57  Colon cancer Neg Hx     Social History:  Social History   Tobacco Use   Smoking status: Never   Smokeless tobacco: Never  Vaping Use   Vaping Use: Never used  Substance Use Topics   Alcohol use: No    Alcohol/week: 0.0 standard drinks of alcohol   Drug use: No    ROS: Constitutional:  Negative for fever, chills, weight loss CV: Negative for chest pain, previous MI, hypertension Respiratory:  Negative for shortness of breath, wheezing, sleep apnea, frequent cough GI:  Negative for nausea, vomiting, bloody stool, GERD  Physical exam: There were no vitals taken for this visit. GENERAL APPEARANCE:  Well appearing, well developed, well nourished, NAD HEENT:  Atraumatic, normocephalic, oropharynx clear NECK:  Supple without lymphadenopathy or  thyromegaly ABDOMEN:  Soft, non-tender, no masses EXTREMITIES:  Moves all extremities well, without clubbing, cyanosis, or edema NEUROLOGIC:  Alert and oriented x 3, normal gait, CN II-XII grossly intact MENTAL STATUS:  appropriate BACK:  Non-tender to palpation, No CVAT SKIN:  Warm, dry, and intact   Results: U/A dipstick

## 2021-12-28 ENCOUNTER — Ambulatory Visit: Payer: 59 | Admitting: Internal Medicine

## 2022-03-20 ENCOUNTER — Other Ambulatory Visit: Payer: Self-pay | Admitting: Nurse Practitioner

## 2022-04-27 ENCOUNTER — Other Ambulatory Visit: Payer: Self-pay | Admitting: Urology

## 2022-04-27 DIAGNOSIS — N529 Male erectile dysfunction, unspecified: Secondary | ICD-10-CM

## 2022-05-28 ENCOUNTER — Other Ambulatory Visit: Payer: Self-pay | Admitting: Internal Medicine

## 2022-05-28 ENCOUNTER — Other Ambulatory Visit: Payer: Self-pay | Admitting: Urology

## 2022-05-28 DIAGNOSIS — F524 Premature ejaculation: Secondary | ICD-10-CM

## 2022-05-28 DIAGNOSIS — J329 Chronic sinusitis, unspecified: Secondary | ICD-10-CM

## 2022-06-13 ENCOUNTER — Other Ambulatory Visit: Payer: Self-pay

## 2022-06-13 DIAGNOSIS — R053 Chronic cough: Secondary | ICD-10-CM

## 2022-06-14 ENCOUNTER — Other Ambulatory Visit (HOSPITAL_COMMUNITY): Payer: Self-pay | Admitting: Adult Health Nurse Practitioner

## 2022-06-14 DIAGNOSIS — S239XXA Sprain of unspecified parts of thorax, initial encounter: Secondary | ICD-10-CM

## 2022-06-18 ENCOUNTER — Ambulatory Visit (HOSPITAL_COMMUNITY)
Admission: RE | Admit: 2022-06-18 | Discharge: 2022-06-18 | Disposition: A | Discharge status: Home / Self Care | Payer: 59 | Source: Ambulatory Visit | Attending: Adult Health Nurse Practitioner | Admitting: Adult Health Nurse Practitioner

## 2022-06-18 DIAGNOSIS — R053 Chronic cough: Secondary | ICD-10-CM | POA: Diagnosis present

## 2022-06-18 DIAGNOSIS — S239XXA Sprain of unspecified parts of thorax, initial encounter: Secondary | ICD-10-CM | POA: Insufficient documentation

## 2022-07-04 ENCOUNTER — Ambulatory Visit (HOSPITAL_COMMUNITY): Payer: 59

## 2022-08-02 ENCOUNTER — Other Ambulatory Visit: Payer: Self-pay | Admitting: Urology

## 2022-08-02 DIAGNOSIS — F524 Premature ejaculation: Secondary | ICD-10-CM

## 2022-09-17 ENCOUNTER — Other Ambulatory Visit: Payer: Self-pay | Admitting: Urology

## 2022-09-17 DIAGNOSIS — N529 Male erectile dysfunction, unspecified: Secondary | ICD-10-CM

## 2022-10-06 ENCOUNTER — Other Ambulatory Visit: Payer: Self-pay | Admitting: Urology

## 2022-10-06 DIAGNOSIS — F524 Premature ejaculation: Secondary | ICD-10-CM

## 2022-10-08 NOTE — Telephone Encounter (Signed)
Left msg for a return call from patient regarding refill request as this is a [facility] pt.

## 2023-01-14 ENCOUNTER — Emergency Department (HOSPITAL_BASED_OUTPATIENT_CLINIC_OR_DEPARTMENT_OTHER)
Admission: EM | Admit: 2023-01-14 | Discharge: 2023-01-14 | Disposition: A | Discharge status: Home / Self Care | Payer: 59 | Attending: Emergency Medicine | Admitting: Emergency Medicine

## 2023-01-14 ENCOUNTER — Other Ambulatory Visit: Payer: Self-pay

## 2023-01-14 ENCOUNTER — Encounter (HOSPITAL_BASED_OUTPATIENT_CLINIC_OR_DEPARTMENT_OTHER): Payer: Self-pay

## 2023-01-14 DIAGNOSIS — N189 Chronic kidney disease, unspecified: Secondary | ICD-10-CM | POA: Diagnosis not present

## 2023-01-14 DIAGNOSIS — R600 Localized edema: Secondary | ICD-10-CM | POA: Insufficient documentation

## 2023-01-14 DIAGNOSIS — G4733 Obstructive sleep apnea (adult) (pediatric): Secondary | ICD-10-CM | POA: Insufficient documentation

## 2023-01-14 LAB — COMPREHENSIVE METABOLIC PANEL
ALT: 39 U/L (ref 0–44)
AST: 23 U/L (ref 15–41)
Albumin: 4.2 g/dL (ref 3.5–5.0)
Alkaline Phosphatase: 49 U/L (ref 38–126)
Anion gap: 8 (ref 5–15)
BUN: 17 mg/dL (ref 6–20)
CO2: 26 mmol/L (ref 22–32)
Calcium: 9.5 mg/dL (ref 8.9–10.3)
Chloride: 103 mmol/L (ref 98–111)
Creatinine, Ser: 1.66 mg/dL — ABNORMAL HIGH (ref 0.61–1.24)
GFR, Estimated: 48 mL/min — ABNORMAL LOW (ref >60)
Glucose, Bld: 93 mg/dL (ref 70–99)
Potassium: 3.9 mmol/L (ref 3.5–5.1)
Sodium: 137 mmol/L (ref 135–145)
Total Bilirubin: 0.4 mg/dL (ref 0.3–1.2)
Total Protein: 6.6 g/dL (ref 6.5–8.1)

## 2023-01-14 LAB — CBC WITH DIFFERENTIAL/PLATELET
Abs Immature Granulocytes: 0.03 10*3/uL (ref 0.00–0.07)
Basophils Absolute: 0 10*3/uL (ref 0.0–0.1)
Basophils Relative: 1 %
Eosinophils Absolute: 0.3 10*3/uL (ref 0.0–0.5)
Eosinophils Relative: 5 %
HCT: 36.7 % — ABNORMAL LOW (ref 39.0–52.0)
Hemoglobin: 12.4 g/dL — ABNORMAL LOW (ref 13.0–17.0)
Immature Granulocytes: 1 %
Lymphocytes Relative: 30 %
Lymphs Abs: 1.7 10*3/uL (ref 0.7–4.0)
MCH: 31.5 pg (ref 26.0–34.0)
MCHC: 33.8 g/dL (ref 30.0–36.0)
MCV: 93.1 fL (ref 80.0–100.0)
Monocytes Absolute: 0.6 10*3/uL (ref 0.1–1.0)
Monocytes Relative: 11 %
Neutro Abs: 3.1 10*3/uL (ref 1.7–7.7)
Neutrophils Relative %: 52 %
Platelets: 197 10*3/uL (ref 150–400)
RBC: 3.94 MIL/uL — ABNORMAL LOW (ref 4.22–5.81)
RDW: 14.7 % (ref 11.5–15.5)
WBC: 5.9 10*3/uL (ref 4.0–10.5)
nRBC: 0 % (ref 0.0–0.2)

## 2023-01-14 LAB — BRAIN NATRIURETIC PEPTIDE: B Natriuretic Peptide: 39.4 pg/mL (ref 0.0–100.0)

## 2023-01-14 NOTE — Discharge Instructions (Signed)
Please read and follow all provided instructions.  Your diagnoses today include:  1. Peripheral edema     Tests performed today include: Blood test to screen for heart failure: Was normal Blood cell counts and electrolytes: Showed mild anemia, otherwise normal infection fighting cell counts Kidney function: Creatinine was slightly higher today than baseline creatinine Vital signs. See below for your results today.   Medications prescribed:  None  Take any prescribed medications only as directed.  Home care instructions:  Follow any educational materials contained in this packet.  Follow-up instructions: Please follow-up with your primary care provider in the next 7 days for further evaluation of your symptoms.  They may consider additional evaluation for your lower extremity swelling.  Continue furosemide as prescribed.  Return instructions:  Please return to the Emergency Department if you experience worsening symptoms.  Return with chest pain, worsening shortness of breath, trouble breathing Please return if you have any other emergent concerns.  Additional Information:  Your vital signs today were: BP 120/68 (BP Location: Left Arm)   Pulse 78   Temp 98.4 F (36.9 C)   Resp 20   Ht 6\' 1"  (1.854 m)   Wt 118.9 kg   SpO2 98%   BMI 34.57 kg/m  If your blood pressure (BP) was elevated above 135/85 this visit, please have this repeated by your doctor within one month. --------------

## 2023-01-14 NOTE — ED Notes (Signed)
Pt given discharge instructions. Pt verbalizes understanding. Significant other expressed dissatisfaction with visit. Attempted to offer ways to remedy the situation. Lab work reviewed, follow-up care reviewed and opportunities given for further questions. NAD noted.  Jillyn Hidden, RN

## 2023-01-14 NOTE — ED Provider Notes (Signed)
[place] EMERGENCY DEPARTMENT AT [facility] Provider Note   CSN: [id number] Arrival date & time: 01/14/23  1338     History  Chief Complaint  Patient presents with   Leg Swelling    James Blankenship is a 57 y.o. male.  Patient with history of lower extremity edema, chronic kidney disease, GERD, OSA, diastasis recti --presents to the emergency department today for evaluation of bilateral lower extremity swelling.  Patient has had waxing and waning lower extremity swelling, mainly in his ankles, over the past year.  They state that their PCP has prescribed Lasix to take as needed.  He was recently in the [place], returning on 01/09/2023.  While there he did have some swelling, however returning home he developed worse than baseline bilateral lower extremity edema in the feet, ankles, and lower legs.  No shortness of breath or chest pain.  Some changes with stool with eating different foods in the [place].  He has had some improvement with elevation, but did not really improve with elevation last night and was worse at work today, prompting emergency department visit.  Patient does not take any calcium channel blockers.  He does do a lot of standing and walking at his job but not strenuous activities.  He has been continuing to take his Lasix daily due to swelling.  He does not feel like he is quite using the bathroom as much, but has had urination.  No fevers, nausea, vomiting diarrhea.  He denies a history of liver disease or congestive heart failure.  No history of DVT.       Home Medications Prior to Admission medications   Medication Sig Start Date End Date Taking? Authorizing Provider  cetirizine (ZYRTEC) 10 MG tablet Take 1 tablet (10 mg total) by mouth daily. 03/23/21   Heather Roberts, NP  chlorthalidone (HYGROTON) 25 MG tablet Take 12.5 mg by mouth every morning. 06/25/21   [provider]  Cholecalciferol (VITAMIN D3) 1.25 MG (50000 UT) CAPS Take  1 capsule by mouth once a week. 09/13/21   [provider]  cholecalciferol (VITAMIN D3) 25 MCG (1000 UNIT) tablet Take 1,000 Units by mouth daily.    [provider]  fluticasone (FLONASE) 50 MCG/ACT nasal spray Place 2 sprays into both nostrils daily. 08/29/21   Anabel Halon, MD  furosemide (LASIX) 20 MG tablet Take 20 mg by mouth daily. 10/11/21   [provider]  meclizine (ANTIVERT) 25 MG tablet Take 1 tablet (25 mg total) by mouth 3 (three) times daily as needed for dizziness. 12/12/20   Moshe Cipro, NP  olmesartan (BENICAR) 20 MG tablet Take 1 tablet (20 mg total) by mouth daily. 06/29/21   Heather Roberts, NP  pantoprazole (PROTONIX) 40 MG tablet TAKE 1 TABLET BY MOUTH EVERY MORNING 30 MINUTES BEFORE BREAKFAST 09/25/21   Paseda, Phillips Grout R, FNP  PARoxetine (PAXIL) 20 MG tablet TAKE 1 TABLET BY MOUTH DAILY 3 TO 4 HOURS BEFORE SEXUAL ACTIVITY 08/02/22   Stoneking, Danford Bad., MD  phentermine 37.5 MG capsule Take 37.5 mg by mouth every morning. 10/11/21   [provider]  rosuvastatin (CRESTOR) 20 MG tablet Take 1 tablet (20 mg total) by mouth daily. Patient taking differently: Take 10 mg by mouth daily. 03/23/21   Heather Roberts, NP  sildenafil (REVATIO) 20 MG tablet TAKE 3 TO 5 TABLETS BY MOUTH 30-60 MINUTES PRIOR TO SEXUAL ACTIVITY 04/29/22   Stoneking, Danford Bad., MD  Syringe/Needle, Disp, (SYRINGE 3CC/23GX1")  23G X 1" 3 ML MISC 1 each by Does not apply route every 14 (fourteen) days. 03/23/21   Heather Roberts, NP  testosterone cypionate (DEPOTESTOSTERONE CYPIONATE) 200 MG/ML injection Inject 0.5 mLs (100 mg total) into the muscle every 14 (fourteen) days. 03/23/21   Heather Roberts, NP  valACYclovir (VALTREX) 1000 MG tablet Take 1,000 mg by mouth daily.  03/30/20   [provider]      Allergies    Penicillins    Review of Systems   Review of Systems  Physical Exam Updated Vital Signs BP 120/68 (BP Location: Left Arm)   Pulse 78   Temp 98.4  F (36.9 C)   Resp 20   Ht 6\' 1"  (1.854 m)   Wt 118.9 kg   SpO2 98%   BMI 34.57 kg/m  Physical Exam Vitals and nursing note reviewed.  Constitutional:      Appearance: He is well-developed. He is not diaphoretic.  HENT:     Head: Normocephalic and atraumatic.     Nose: Nose normal.     Mouth/Throat:     Mouth: Mucous membranes are moist. Mucous membranes are not dry.  Eyes:     Conjunctiva/sclera: Conjunctivae normal.  Neck:     Vascular: Normal carotid pulses. No carotid bruit or JVD.     Trachea: Trachea normal. No tracheal deviation.  Cardiovascular:     Rate and Rhythm: Normal rate and regular rhythm.     Pulses: No decreased pulses.          Dorsalis pedis pulses are 2+ on the right side and 2+ on the left side.       Posterior tibial pulses are 2+ on the right side and 2+ on the left side.     Heart sounds: Normal heart sounds, S1 normal and S2 normal. Heart sounds not distant. No murmur heard. Pulmonary:     Effort: Pulmonary effort is normal. No respiratory distress.     Breath sounds: Normal breath sounds. No wheezing.  Chest:     Chest wall: No tenderness.  Abdominal:     General: Bowel sounds are normal.     Palpations: Abdomen is soft.     Tenderness: There is no abdominal tenderness. There is no guarding or rebound.  Musculoskeletal:     Cervical back: Normal range of motion and neck supple. No muscular tenderness.     Right lower leg: Edema present.     Left lower leg: Edema present.     Comments: 1+ pitting edema of the feet, ankles to the distal shins bilaterally, symmetric, no erythema or signs of cellulitis.  Skin:    General: Skin is warm and dry.     Coloration: Skin is not pale.  Neurological:     Mental Status: He is alert. Mental status is at baseline.  Psychiatric:        Mood and Affect: Mood normal.     ED Results / Procedures / Treatments   Labs (all labs ordered are listed, but only abnormal results are displayed) Labs Reviewed  CBC  WITH DIFFERENTIAL/PLATELET - Abnormal; Notable for the following components:      Result Value   RBC 3.94 (*)    Hemoglobin 12.4 (*)    HCT 36.7 (*)    All other components within normal limits  COMPREHENSIVE METABOLIC PANEL - Abnormal; Notable for the following components:   Creatinine, Ser 1.66 (*)    GFR, Estimated 48 (*)    All  other components within normal limits  BRAIN NATRIURETIC PEPTIDE    EKG None  Radiology No results found.  Procedures Procedures    Medications Ordered in ED Medications - No data to display  ED Course/ Medical Decision Making/ A&P Clinical Course as of 01/14/23 1618  Mon Jan 14, 2023  1613 GFR, Estimated(!): 48 [ML]    Clinical Course User Index [ML] [place], [place], [place]    Patient seen and examined. History obtained directly from patient. Work-up including labs, imaging, EKG ordered in triage, if performed, were reviewed.  I reviewed patient's outpatient primary provider notes.  I have reviewed lab work in care everywhere from April 2024.  Labs/EKG: Independently reviewed and interpreted.  This included: CBC which showed a slight decrease in hemoglobin from previous to 12.4; CMP creatinine slightly elevated from chronic baseline at 1.66 today with a BUN of 17, normal proteins and liver function test, normal electrolytes, BNP normal at 39.4.  Imaging: None ordered  Medications/Fluids: None ordered  Most recent vital signs reviewed and are as follows: BP 120/68 (BP Location: Left Arm)   Pulse 78   Temp 98.4 F (36.9 C)   Resp 20   Ht 6\' 1"  (1.854 m)   Wt 118.9 kg   SpO2 98%   BMI 34.57 kg/m   Initial impression: Bilateral lower extremity edema.  Considered emergent causes such as congestive heart failure, kidney failure, liver failure, bilateral DVT.  Also considered etiologies such as medication related, venous insufficiency.  Reviewed lab work and discussed results today with patient and wife at bedside.  We considered  ultrasound, especially given recent travel, however symptoms really have been ongoing prior to travel and would be low likelihood for patient to have developed bilateral DVT.  Patient's wife voiced frustration and concern that we are not doing more for him today.  We discussed emergent etiology of lower extremity edema, and that if these do not exist, recommended outpatient follow-up with lifestyle modifications such as lowering levels of salt in the diet, using compression stockings, continue with elevation.  They voiced desire to follow-up with PCP.  Home treatment plan: Lifestyle adjustments as above  Return instructions discussed with patient: New or worsening symptoms, chest pain, shortness of breath  Follow-up instructions discussed with patient: PCP in 1 week                            Medical Decision Making  Patient with bilateral lower extremity edema x 1 year, however worse over the past several days.  Unclear etiology.  Patient does not appear to have clinical heart failure, lungs are clear, no shortness of breath or hypoxia, BNP is normal.  No risk factors for liver disease and proteins are normal today.  Patient does have mild chronic kidney disease, however do not suspect that this is the etiology as creatinine only slightly above baseline today.  Overall low concern for bilateral DVT.  There may be an element of venous insufficiency as symptoms are worse when patient is up on his feet at his job and usually worse after elevation overnight.  He is not on a calcium channel blocker.  I have low concern for an emergent etiology of patient's symptoms today.  No signs of infection.  Low clinical concern for DVT.  I do not feel that he needs to be admitted for further evaluation or emergent echocardiogram.  I agree that he can be managed as an outpatient and further  workup can be considered at that time.  The patient's vital signs, pertinent lab work and imaging were reviewed and interpreted  as discussed in the ED course. Hospitalization was considered for further testing, treatments, or serial exams/observation. However as patient is well-appearing, has a stable exam, and reassuring studies today, I do not feel that they warrant admission at this time. This plan was discussed with the patient who verbalizes agreement and comfort with this plan and seems reliable and able to return to the Emergency Department with worsening or changing symptoms.           Final Clinical Impression(s) / ED Diagnoses Final diagnoses:  Peripheral edema    Rx / DC Orders ED Discharge Orders     None         Renne Crigler, PA-C 01/14/23 1623    Vanetta Mulders, MD 01/17/23 2248

## 2023-01-14 NOTE — ED Triage Notes (Signed)
Patient here POV from Home.  Endorses Bilateral Lower Extremity Swelling and Tingling for 2.5 Days. No discernable Trauma. No N/V/D. No Fevers.   NAD Noted during triage. A&Ox4. GCS 15. Ambulatory.

## 2023-01-22 ENCOUNTER — Ambulatory Visit (HOSPITAL_COMMUNITY)
Admission: RE | Admit: 2023-01-22 | Discharge: 2023-01-22 | Disposition: A | Discharge status: Home / Self Care | Payer: 59 | Source: Ambulatory Visit | Attending: Adult Health Nurse Practitioner | Admitting: Adult Health Nurse Practitioner

## 2023-01-22 ENCOUNTER — Other Ambulatory Visit (HOSPITAL_COMMUNITY): Payer: Self-pay | Admitting: Adult Health Nurse Practitioner

## 2023-01-22 DIAGNOSIS — M10372 Gout due to renal impairment, left ankle and foot: Secondary | ICD-10-CM | POA: Insufficient documentation

## 2023-03-08 ENCOUNTER — Other Ambulatory Visit (HOSPITAL_COMMUNITY): Payer: Self-pay | Admitting: Adult Health Nurse Practitioner

## 2023-03-08 DIAGNOSIS — M79672 Pain in left foot: Secondary | ICD-10-CM

## 2023-03-08 DIAGNOSIS — E790 Hyperuricemia without signs of inflammatory arthritis and tophaceous disease: Secondary | ICD-10-CM

## 2023-03-09 ENCOUNTER — Ambulatory Visit (INDEPENDENT_AMBULATORY_CARE_PROVIDER_SITE_OTHER): Payer: 59

## 2023-03-09 DIAGNOSIS — M79672 Pain in left foot: Secondary | ICD-10-CM | POA: Diagnosis not present

## 2023-03-09 DIAGNOSIS — E790 Hyperuricemia without signs of inflammatory arthritis and tophaceous disease: Secondary | ICD-10-CM
